# Patient Record
Sex: Male | Born: 1990 | Race: Black or African American | Hispanic: No | Marital: Single | Smoking: Never smoker
Health system: Southern US, Community
[De-identification: ages and names within clinical notes are randomized; demographics above are authoritative.]

## PROBLEM LIST (undated history)

## (undated) ENCOUNTER — Emergency Department (HOSPITAL_COMMUNITY): Disposition: A | Discharge status: Home / Self Care | Payer: No Typology Code available for payment source

## (undated) DIAGNOSIS — F209 Schizophrenia, unspecified: Secondary | ICD-10-CM

## (undated) DIAGNOSIS — Z5900 Homelessness unspecified: Secondary | ICD-10-CM

## (undated) DIAGNOSIS — R4689 Other symptoms and signs involving appearance and behavior: Secondary | ICD-10-CM

## (undated) DIAGNOSIS — IMO0001 Reserved for inherently not codable concepts without codable children: Secondary | ICD-10-CM

---
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
## 2010-08-11 ENCOUNTER — Inpatient Hospital Stay (INDEPENDENT_AMBULATORY_CARE_PROVIDER_SITE_OTHER)
Admission: RE | Admit: 2010-08-11 | Discharge: 2010-08-11 | Disposition: A | Discharge status: Home / Self Care | Payer: Self-pay | Source: Ambulatory Visit | Attending: Family Medicine | Admitting: Family Medicine

## 2010-08-11 DIAGNOSIS — R6889 Other general symptoms and signs: Secondary | ICD-10-CM

## 2010-08-14 LAB — GC/CHLAMYDIA PROBE AMP, GENITAL
Chlamydia, DNA Probe: POSITIVE — AB
GC Probe Amp, Genital: NEGATIVE

## 2010-10-30 LAB — CBC
Hemoglobin: 16.1
MCV: 91.3
RDW: 12.8

## 2010-10-30 LAB — DIFFERENTIAL
Eosinophils Absolute: 0.2
Eosinophils Relative: 2
Lymphocytes Relative: 49 — ABNORMAL HIGH
Lymphs Abs: 3.6
Monocytes Absolute: 0.7
Monocytes Relative: 9
Neutrophils Relative %: 38 — ABNORMAL LOW

## 2010-10-30 LAB — POCT I-STAT, CHEM 8
Chloride: 104
HCT: 48
Potassium: 4.4
Sodium: 138

## 2010-10-30 LAB — HEPATIC FUNCTION PANEL
ALT: 10
Alkaline Phosphatase: 61
Indirect Bilirubin: 0.7

## 2010-10-30 LAB — LIPASE, BLOOD: Lipase: 21

## 2010-10-30 LAB — PROTIME-INR: Prothrombin Time: 13.9

## 2011-04-17 ENCOUNTER — Emergency Department (HOSPITAL_COMMUNITY)
Admission: EM | Admit: 2011-04-17 | Discharge: 2011-04-17 | Disposition: A | Discharge status: Home / Self Care | Payer: Self-pay | Attending: Emergency Medicine | Admitting: Emergency Medicine

## 2011-04-17 ENCOUNTER — Other Ambulatory Visit: Payer: Self-pay

## 2011-04-17 ENCOUNTER — Encounter (HOSPITAL_COMMUNITY): Payer: Self-pay | Admitting: Emergency Medicine

## 2011-04-17 DIAGNOSIS — M62838 Other muscle spasm: Secondary | ICD-10-CM | POA: Insufficient documentation

## 2011-04-17 DIAGNOSIS — M545 Low back pain, unspecified: Secondary | ICD-10-CM | POA: Insufficient documentation

## 2011-04-17 DIAGNOSIS — R202 Paresthesia of skin: Secondary | ICD-10-CM

## 2011-04-17 DIAGNOSIS — R209 Unspecified disturbances of skin sensation: Secondary | ICD-10-CM | POA: Insufficient documentation

## 2011-04-17 MED ORDER — METHOCARBAMOL 500 MG PO TABS: 1000.0000 mg | ORAL_TABLET | Freq: Four times a day (QID) | ORAL | Status: AC

## 2011-04-17 MED ORDER — HYDROCODONE-ACETAMINOPHEN 5-325 MG PO TABS: ORAL_TABLET | ORAL | Status: AC

## 2011-04-17 MED ORDER — NAPROXEN 500 MG PO TABS: 500.0000 mg | ORAL_TABLET | Freq: Two times a day (BID) | ORAL | Status: DC

## 2011-04-17 NOTE — ED Notes (Signed)
Got bad x 1 month ago

## 2011-04-17 NOTE — Discharge Instructions (Signed)
Please read and follow all provided instructions.  Your diagnoses today include:  1. Muscle spasm   2. Paresthesias     Tests performed today include:  Vital signs - see below for your results today  Medications prescribed:   Vicodin (hydrocodone/acetaminophen) - narcotic pain medication  You have been prescribed narcotic pain medication such as Vicodin or Percocet: DO NOT drive or perform any activities that require you to be awake and alert because this medicine can make you drowsy. BE VERY CAREFUL not to take multiple medicines containing Tylenol (also called acetaminophen). Doing so can lead to an overdose which can damage your liver and cause liver failure and possibly death.    Robaxin (methocarbamol) - muscle relaxer medication  You have been prescribed a muscle relaxer medication such as Robaxin, Flexeril, or Valium: DO NOT drive or perform any activities that require you to be awake and alert because this medicine can make you drowsy.    Naproxen - anti-inflammatory pain medication  Do not exceed 500mg  naproxen every 12 hours  You have been prescribed an anti-inflammatory medication or NSAID. Take with food. Take smallest effective dose for the shortest duration needed for your pain. Stop taking if you experience stomach pain or vomiting.   Take any prescribed medications only as directed.  Home care instructions:   Follow any educational materials contained in this packet  Please rest, use ice or heat on your back for the next several days  Do not lift, push, pull anything more than 10 pounds for the next week  Follow-up instructions: Please follow-up with your primary care provider in the next 1 week for further evaluation of your symptoms. If you do not have a primary care doctor -- see below for referral information.   Return instructions:  SEEK IMMEDIATE MEDICAL ATTENTION IF YOU HAVE:  New numbness, tingling, weakness, or problem with the use of your arms or  legs  Severe back pain not relieved with medications  Loss control of your bowels or bladder  Increasing pain in any areas of the body (such as chest or abdominal pain)  Shortness of breath, dizziness, or fainting.   Worsening nausea (feeling sick to your stomach), vomiting, fever, or sweats  Any other emergent concerns regarding your health   Additional Information:  Your vital signs today were: BP 121/78  Pulse 67  Temp(Src) 98.2 F (36.8 C) (Oral)  Resp 18  SpO2 100% If your blood pressure (BP) was elevated above 135/85 this visit, please have this repeated by your doctor within one month. -------------- No Primary Care Doctor Call [facility]  [phone] Other agencies that provide inexpensive medical care    [facility]  [phone]    [facility]  [phone]    [facility]  [phone]    [facility]  [phone]    [facility]  [phone]    [facility]  [phone] -------------- RESOURCE GUIDE:  Dental Problems  Patients with Medicaid: [facility] [phone] [address]                                            [phone] [address] Phone:  [phone]  Phone:  [phone]  If unable to pay or uninsured, contact:  [facility] or [facility]. to become qualified for the adult dental clinic.  Chronic Pain Problems Contact [facility]  [phone] Patients need to be referred by their primary care doctor.  Insufficient Money for Medicine Contact [employer]:  call "211" or [facility] [phone].  Psychological Services [facility]  [phone] [facility]  [phone] [facility]   [phone] (emergency services [phone])  Substance Abuse Resources [facility]  [phone] [facility] [phone] The  [place] [phone] Floydene Flock [phone] Residential & Outpatient Substance Abuse Program  [phone]  Abuse/Neglect [facility] Child Abuse Hotline [phone] [facility] Child Abuse Hotline [phone] (After Hours)  Emergency Shelter [facility] [phone]  Maternity Homes [facility] [place] [facility] [phone] [facility] [phone]  [facility] Resources  [facility]     [employer]                          [facility] Dept. [address] [place]                       [address]      [address]  Blondell Reveal Phone:  [phone]                                   Phone:  [phone]                 Phone:  [phone]  [facility] Phone:  [phone]  [facility] Child Abuse Hotline 7016750037 [phone] (After Hours)
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
## 2011-04-17 NOTE — ED Provider Notes (Signed)
Medical screening examination/treatment/procedure(s) were performed by non-physician practitioner and as supervising physician I was immediately available for consultation/collaboration.  Cheri Guppy, MD 04/17/11 [phone]
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
## 2011-05-30 ENCOUNTER — Encounter (HOSPITAL_COMMUNITY): Payer: Self-pay | Admitting: Emergency Medicine

## 2011-05-30 ENCOUNTER — Emergency Department (HOSPITAL_COMMUNITY)
Admission: EM | Admit: 2011-05-30 | Discharge: 2011-05-30 | Disposition: A | Discharge status: Home / Self Care | Payer: Self-pay | Attending: Emergency Medicine | Admitting: Emergency Medicine

## 2011-05-30 DIAGNOSIS — IMO0002 Reserved for concepts with insufficient information to code with codable children: Secondary | ICD-10-CM | POA: Insufficient documentation

## 2011-05-30 DIAGNOSIS — M549 Dorsalgia, unspecified: Secondary | ICD-10-CM | POA: Insufficient documentation

## 2011-05-30 DIAGNOSIS — IMO0001 Reserved for inherently not codable concepts without codable children: Secondary | ICD-10-CM | POA: Insufficient documentation

## 2011-05-30 DIAGNOSIS — M79609 Pain in unspecified limb: Secondary | ICD-10-CM | POA: Insufficient documentation

## 2011-05-30 DIAGNOSIS — M62838 Other muscle spasm: Secondary | ICD-10-CM | POA: Insufficient documentation

## 2011-05-30 DIAGNOSIS — R202 Paresthesia of skin: Secondary | ICD-10-CM

## 2011-05-30 DIAGNOSIS — R209 Unspecified disturbances of skin sensation: Secondary | ICD-10-CM | POA: Insufficient documentation

## 2011-05-30 HISTORY — DX: Reserved for inherently not codable concepts without codable children: IMO0001

## 2011-05-30 MED ORDER — IBUPROFEN 600 MG PO TABS: 600.0000 mg | ORAL_TABLET | Freq: Four times a day (QID) | ORAL | Status: AC | PRN

## 2011-05-30 NOTE — ED Provider Notes (Signed)
Pt presents with chronic "nerve" pain demanding medication. He is threatening and non-compliant toward staff stating his intention to sue. I have discussed with him available options. He has no acute worrisome findings. Stable vital signs. Will discharge with brace and RX for ibuprofen 600 mg.   Loren Racer, MD 05/30/11 2238
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
## 2011-05-30 NOTE — Discharge Instructions (Signed)
Followup with orthopedics if symptoms continue. Use conservative methods at home including heat therapy and cold therapy as we discussed. More information on cold therapy is listed below.    SEEK IMMEDIATE MEDICAL ATTENTION IF: New numbness, tingling, weakness, or problem with the use of your arms or legs.  Severe back pain not relieved with medications.  Change in bowel or bladder control.  Increasing pain in any areas of the body (such as chest or abdominal pain).  Shortness of breath, dizziness or fainting.  Nausea (feeling sick to your stomach), vomiting, fever, or sweats.  COLD THERAPY DIRECTIONS:  Ice or gel packs can be used to reduce both pain and swelling. Ice is the most helpful within the first 24 to 48 hours after an injury or flareup from overusing a muscle or joint.  Ice is effective, has very few side effects, and is safe for most people to use.   If you expose your skin to cold temperatures for too long or without the proper protection, you can damage your skin or nerves. Watch for signs of skin damage due to cold.   HOME CARE INSTRUCTIONS  Follow these tips to use ice and cold packs safely.  Place a dry or damp towel between the ice and skin. A damp towel will cool the skin more quickly, so you may need to shorten the time that the ice is used.  For a more rapid response, add gentle compression to the ice.  Ice for no more than 10 to 20 minutes at a time. The bonier the area you are icing, the less time it will take to get the benefits of ice.  Check your skin after 5 minutes to make sure there are no signs of a poor response to cold or skin damage.  Rest 20 minutes or more in between uses.  Once your skin is numb, you can end your treatment. You can test numbness by very lightly touching your skin. The touch should be so light that you do not see the skin dimple from the pressure of your fingertip. When using ice, most people will feel these normal sensations in this order:  cold, burning, aching, and numbness.  Do not use ice on someone who cannot communicate their responses to pain, such as small children or people with dementia.   HOW TO MAKE AN ICE PACK  To make an ice pack, do one of the following:  Place crushed ice or a bag of frozen vegetables in a sealable plastic bag. Squeeze out the excess air. Place this bag inside another plastic bag. Slide the bag into a pillowcase or place a damp towel between your skin and the bag.  Mix 3 parts water with 1 part rubbing alcohol. Freeze the mixture in a sealable plastic bag. When you remove the mixture from the freezer, it will be slushy. Squeeze out the excess air. Place this bag inside another plastic bag. Slide the bag into a pillowcase or place a damp towel between your skin and the bag.   SEEK MEDICAL CARE IF:  You develop white spots on your skin. This may give the skin a blotchy (mottled) appearance.  Your skin turns blue or pale.  Your skin becomes waxy or hard.  Your swelling gets worse.  MAKE SURE YOU:  Understand these instructions.  Will watch your condition.  Will get help right away if you are not doing well or get worse.    Chronic Pain Discharge Instructions  Emergency care  providers appreciate that many patients coming to [place] are in severe pain and we wish to address their pain in the safest, most responsible manner.  It is important to recognize however, that the proper treatment of chronic pain differs from that of the pain of injuries and acute illnesses.  Our goal is to provide quality, safe, personalized care and we thank you for giving [place] the opportunity to serve you. The use of narcotics and related agents for chronic pain syndromes may lead to additional physical and psychological problems.  Nearly as many people die from prescription narcotics each year as die from car crashes.  Additionally, this risk is increased if such prescriptions are obtained from a variety of sources.  Therefore, only  your primary care physician or a pain management specialist is able to safely treat such syndromes with narcotic medications long-term.    Documentation revealing such prescriptions have been sought from multiple sources may prohibit [place] from providing a refill or different narcotic medication.  Your name may be checked first through the [facility] Controlled Substances Reporting System.  This database is a record of controlled substance medication prescriptions that the patient has received.  This has been established by [facility] in an effort to eliminate the dangerous, and often life threatening, practice of obtaining multiple prescriptions from different medical providers.   If you have a chronic pain syndrome (i.e. chronic headaches, recurrent back or neck pain, dental pain, abdominal or pelvis pain without a specific diagnosis, or neuropathic pain such as fibromyalgia) or recurrent visits for the same condition without an acute diagnosis, you may be treated with non-narcotics and other non-addictive medicines.  Allergic reactions or negative side effects that may be reported by a patient to such medications will not typically lead to the use of a narcotic analgesic or other controlled substance as an alternative.   Patients managing chronic pain with a personal physician should have provisions in place for breakthrough pain.  If you are in crisis, you should call your physician.  If your physician directs you to the emergency department, please have the doctor call and speak to our attending physician concerning your care.   When patients come to the Emergency Department (ED) with acute medical conditions in which the Emergency Department physician feels appropriate to prescribe narcotic or sedating pain medication, the physician will prescribe these in very limited quantities.  The amount of these medications will last only until you can see your primary care physician in his/her office.  Any  patient who returns to the ED seeking refills should expect only non-narcotic pain medications.   In the event of an acute medical condition exists and the emergency physician feels it is necessary that the patient be given a narcotic or sedating medication -  a responsible adult driver should be present in the room prior to the medication being given by the nurse.   Prescriptions for narcotic or sedating medications that have been lost, stolen or expired will not be refilled in the Emergency Department.    Patients who have chronic pain may receive non-narcotic prescriptions until seen by their primary care physician.  It is every patient's personal responsibility to maintain active prescriptions with his or her primary care physician or specialist.
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
## 2011-05-30 NOTE — Progress Notes (Signed)
Orthopedic Tech Progress Note Patient Details:  Justin [name] [birth year] [id number]  Other Ortho Devices Type of Ortho Device: Knee Sleeve Ortho Device Location: right knee Ortho Device Interventions: Application   [name] 05/30/2011, 10:24 PM

## 2011-05-30 NOTE — ED Notes (Signed)
Dr. Ranae Palms at bedside to speak with pt.  Discussed with pt that he will be discharged with knee sleeve and given rx for ibuprofen.  Pt agreed to plan of care as witnessed by this RN.

## 2011-05-30 NOTE — ED Notes (Addendum)
Patient complaining of "muscle strain" left leg pain from thigh to his knee; patient also complaining of "nerve pains" in both feet and in lower back (symptoms have been going on for two months).  Patient states that he was seen here recently for the same symptoms. Patient states that his nerve pain becomes worse when the weather gets cold.  Patient was given pain medication prescription for his back.

## 2011-05-30 NOTE — ED Notes (Signed)
Rx x 1, d/c to lobby in wheelchair.  Pt requesting paperwork, given d/c papers and pt acknowledged d/c paperwork.

## 2011-06-11 NOTE — ED Provider Notes (Signed)
Medical screening examination/treatment/procedure(s) were conducted as a shared visit with non-physician practitioner(s) and myself.  I personally evaluated the patient during the encounter   Loren Racer, MD 06/11/11 [phone]

## 2011-09-15 ENCOUNTER — Emergency Department (HOSPITAL_COMMUNITY)
Admission: EM | Admit: 2011-09-15 | Discharge: 2011-09-17 | Disposition: A | Discharge status: Other Acute Inpatient Hospital | Payer: Self-pay | Attending: Emergency Medicine | Admitting: Emergency Medicine

## 2011-09-15 ENCOUNTER — Encounter (HOSPITAL_COMMUNITY): Payer: Self-pay | Admitting: Emergency Medicine

## 2011-09-15 DIAGNOSIS — Z046 Encounter for general psychiatric examination, requested by authority: Secondary | ICD-10-CM

## 2011-09-15 DIAGNOSIS — R4689 Other symptoms and signs involving appearance and behavior: Secondary | ICD-10-CM

## 2011-09-15 DIAGNOSIS — Z008 Encounter for other general examination: Secondary | ICD-10-CM | POA: Insufficient documentation

## 2011-09-15 DIAGNOSIS — F29 Unspecified psychosis not due to a substance or known physiological condition: Secondary | ICD-10-CM | POA: Insufficient documentation

## 2011-09-15 LAB — COMPREHENSIVE METABOLIC PANEL WITH GFR
ALT: 9 U/L (ref 0–53)
AST: 15 U/L (ref 0–37)
Alkaline Phosphatase: 71 U/L (ref 39–117)
BUN: 10 mg/dL (ref 6–23)
Calcium: 9.7 mg/dL (ref 8.4–10.5)
Chloride: 100 meq/L (ref 96–112)
GFR calc Af Amer: >90 mL/min (ref >90)
GFR calc non Af Amer: >90 mL/min (ref >90)
Glucose, Bld: 87 mg/dL (ref 70–99)
Total Bilirubin: 0.6 mg/dL (ref 0.3–1.2)

## 2011-09-15 LAB — CBC WITH DIFFERENTIAL/PLATELET
Basophils Absolute: 0.1 K/uL (ref 0.0–0.1)
Basophils Relative: 1 % (ref 0–1)
Eosinophils Absolute: 0.1 10*3/uL (ref 0.0–0.7)
Eosinophils Relative: 1 % (ref 0–5)
HCT: 43.9 % (ref 39.0–52.0)
Hemoglobin: 16.3 g/dL (ref 13.0–17.0)
Lymphocytes Relative: 43 % (ref 12–46)
Lymphs Abs: 2.9 K/uL (ref 0.7–4.0)
MCH: 30.9 pg (ref 26.0–34.0)
MCHC: 37.1 g/dL — ABNORMAL HIGH (ref 30.0–36.0)
MCV: 83.3 fL (ref 78.0–100.0)
Monocytes Absolute: 0.4 10*3/uL (ref 0.1–1.0)
Monocytes Relative: 6 % (ref 3–12)
Neutro Abs: 3.3 K/uL (ref 1.7–7.7)
Neutrophils Relative %: 49 % (ref 43–77)
Platelets: 164 K/uL (ref 150–400)
RBC: 5.27 MIL/uL (ref 4.22–5.81)
RDW: 12.2 % (ref 11.5–15.5)
WBC: 6.8 10*3/uL (ref 4.0–10.5)

## 2011-09-15 LAB — COMPREHENSIVE METABOLIC PANEL
Albumin: 4.6 g/dL (ref 3.5–5.2)
CO2: 25 mEq/L (ref 19–32)
Creatinine, Ser: 0.77 mg/dL (ref 0.50–1.35)
Potassium: 3.8 mEq/L (ref 3.5–5.1)
Sodium: 134 mEq/L — ABNORMAL LOW (ref 135–145)
Total Protein: 7.2 g/dL (ref 6.0–8.3)

## 2011-09-15 LAB — ACETAMINOPHEN LEVEL: Acetaminophen (Tylenol), Serum: <15 ug/mL (ref 10–30)

## 2011-09-15 LAB — ETHANOL: Alcohol, Ethyl (B): <11 mg/dL (ref 0–11)

## 2011-09-15 MED ORDER — ONDANSETRON HCL 4 MG PO TABS: 4.0000 mg | ORAL_TABLET | Freq: Three times a day (TID) | ORAL | Status: DC | PRN

## 2011-09-15 MED ORDER — ZOLPIDEM TARTRATE 5 MG PO TABS: 5.0000 mg | ORAL_TABLET | Freq: Every evening | ORAL | Status: DC | PRN

## 2011-09-15 MED ORDER — LORAZEPAM 1 MG PO TABS: 1.0000 mg | ORAL_TABLET | Freq: Three times a day (TID) | ORAL | Status: DC | PRN

## 2011-09-15 MED ORDER — NICOTINE 21 MG/24HR TD PT24: 21.0000 mg | MEDICATED_PATCH | Freq: Every day | TRANSDERMAL | Status: DC

## 2011-09-15 MED ORDER — IBUPROFEN 600 MG PO TABS
600.0000 mg | ORAL_TABLET | Freq: Three times a day (TID) | ORAL | Status: DC | PRN
  Administered 2011-09-16 (×2): 600 mg via ORAL
  Filled 2011-09-15 (×2): qty 1

## 2011-09-15 NOTE — ED Notes (Signed)
[employer] at bedside 
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
## 2011-09-15 NOTE — ED Notes (Signed)
IVC - SI/HI/ choking dog

## 2011-09-15 NOTE — ED Provider Notes (Signed)
Medical screening examination/treatment/procedure(s) were performed by non-physician practitioner and as supervising physician I was immediately available for consultation/collaboration.   Gavin Pound. Boubacar Lerette, MD 09/15/11 2210

## 2011-09-16 LAB — RAPID URINE DRUG SCREEN, HOSP PERFORMED
Amphetamines: NOT DETECTED
Benzodiazepines: NOT DETECTED
Opiates: NOT DETECTED
Tetrahydrocannabinol: NOT DETECTED

## 2011-09-16 NOTE — ED Notes (Signed)
Pt's aunt into see 

## 2011-09-16 NOTE — ED Notes (Signed)
Talking quielty w/ visitor
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
## 2011-09-16 NOTE — ED Notes (Signed)
telepsych in progress 

## 2011-09-16 NOTE — ED Notes (Signed)
ACT into see 

## 2011-09-16 NOTE — ED Notes (Signed)
Up to the desk on the phone 

## 2011-09-16 NOTE — ED Notes (Signed)
Pt is aware that he is going to remain under IVC.

## 2011-09-16 NOTE — ED Notes (Signed)
Up to the bathroom 

## 2011-09-16 NOTE — BHH Counselor (Signed)
Justin [name], assessment counselor at [facility], submitted Pt for admission to [facility]. Consulted with Jacquelyne Balint, [facility] who confirmed there is not an appropriate bed available at this time. Gave clinical report to Dr. Sudie Grumbling who accepted Pt when a 400 hall bed is available.  Harlin Rain Patsy [place]

## 2011-09-16 NOTE — ED Notes (Signed)
IVC remains in place per Dr Clarene Duke.

## 2011-09-17 NOTE — BHH Counselor (Signed)
Per shift report, patient accepted to [facility] by Dr. Theotis Barrio pending a 400 hall bed.   Writer was later informed by patient's nurse-Michael that their was a telepsych completed yesterday by nursing staff Janie. The telepsych psychiatrist  recommended discharge home and reversed patient's IVC papers. This Clinical research associate and Casimiro Needle are both puzzled and confused as to why this patient is still in the ED awaiting a bed at [facility]. Appears that patient should have been discharged yesterday. Writer and nurse reviewed notes written by patient's nurse and ACT team staff yesterday and today to see if their was a documented reason for patient to remain in the ED for holding/bed placement. We were not able to find any relevant documentation to keep patient in the ED. Writer discussed this issue with EDP-Dr. Silverio Lay. He also found no documented reason to keep patient in the ED if telepsych recommended discharge yesterday. Dr. Silverio Lay agreed to discharge patient accordingly after carefully reviewing all notes. Writer will provide patient with the appropriate follow-up referrals to mental health, local therapist/psychiatrist, mobile crises, etc.

## 2011-09-17 NOTE — ED Provider Notes (Signed)
Care assumed at sign out. As per tele psych by Dr. Candida Peeling, patient is no longer having delusions or hallucinations or suicidal or homicidal ideations. He is to be discharged as per psych. Will d/c patient on this recommendation.   Richardean Canal, MD 09/17/11 [phone]

## 2011-09-17 NOTE — ED Provider Notes (Signed)
BP 110/67  Pulse 53  Temp 97.8 F (36.6 C) (Oral)  Resp 18  SpO2 100% No issues overnight. Pt IVC for psychosis. Accepted at [facility] pending bed availability   Loren Racer, MD 09/17/11 [phone]

## 2011-09-17 NOTE — ED Provider Notes (Signed)
Medical screening examination/treatment/procedure(s) were performed by non-physician practitioner and as supervising physician I was immediately available for consultation/collaboration.   Amunique Neyra Y. Sherrina Zaugg, MD 09/17/11 1557 

## 2014-03-18 ENCOUNTER — Emergency Department (HOSPITAL_COMMUNITY)
Admission: EM | Admit: 2014-03-18 | Discharge: 2014-03-18 | Disposition: A | Discharge status: Home / Self Care | Payer: Self-pay | Attending: Emergency Medicine | Admitting: Emergency Medicine

## 2014-03-18 ENCOUNTER — Encounter (HOSPITAL_COMMUNITY): Payer: Self-pay | Admitting: Emergency Medicine

## 2014-03-18 DIAGNOSIS — M79672 Pain in left foot: Secondary | ICD-10-CM | POA: Insufficient documentation

## 2014-03-18 DIAGNOSIS — Z590 Homelessness: Secondary | ICD-10-CM | POA: Insufficient documentation

## 2014-03-18 DIAGNOSIS — R2243 Localized swelling, mass and lump, lower limb, bilateral: Secondary | ICD-10-CM | POA: Insufficient documentation

## 2014-03-18 DIAGNOSIS — M79671 Pain in right foot: Secondary | ICD-10-CM | POA: Insufficient documentation

## 2014-03-18 HISTORY — DX: Homelessness: Z59.0

## 2014-03-18 HISTORY — DX: Homelessness unspecified: Z59.00

## 2014-03-18 HISTORY — DX: Other symptoms and signs involving appearance and behavior: R46.89

## 2014-03-18 MED ORDER — IBUPROFEN 600 MG PO TABS: 600.0000 mg | ORAL_TABLET | Freq: Four times a day (QID) | ORAL | Status: DC | PRN

## 2014-03-18 MED ORDER — IBUPROFEN 400 MG PO TABS
600.0000 mg | ORAL_TABLET | Freq: Once | ORAL | Status: AC
  Administered 2014-03-18: 600 mg via ORAL
  Filled 2014-03-18 (×2): qty 1

## 2014-03-18 NOTE — ED Notes (Signed)
Pt. arrived with EMS from [facility] ( homeless) reorts bilateral foot pain / swelling for several weeks , denies injury , pt. stated he has been walking long distance .

## 2014-03-18 NOTE — Discharge Instructions (Signed)
Cryotherapy °Cryotherapy means treatment with cold. Ice or gel packs can be used to reduce both pain and swelling. Ice is the most helpful within the first 24 to 48 hours after an injury or flare-up from overusing a muscle or joint. Sprains, strains, spasms, burning pain, shooting pain, and aches can all be eased with ice. Ice can also be used when recovering from surgery. Ice is effective, has very few side effects, and is safe for most people to use. °PRECAUTIONS  °Ice is not a safe treatment option for people with: °· Raynaud phenomenon. This is a condition affecting small blood vessels in the extremities. Exposure to cold may cause your problems to return. °· Cold hypersensitivity. There are many forms of cold hypersensitivity, including: °¨ Cold urticaria. Red, itchy hives appear on the skin when the tissues begin to warm after being iced. °¨ Cold erythema. This is a red, itchy rash caused by exposure to cold. °¨ Cold hemoglobinuria. Red blood cells break down when the tissues begin to warm after being iced. The hemoglobin that carry oxygen are passed into the urine because they cannot combine with blood proteins fast enough. °· Numbness or altered sensitivity in the area being iced. °If you have any of the following conditions, do not use ice until you have discussed cryotherapy with your caregiver: °· Heart conditions, such as arrhythmia, angina, or chronic heart disease. °· High blood pressure. °· Healing wounds or open skin in the area being iced. °· Current infections. °· Rheumatoid arthritis. °· Poor circulation. °· Diabetes. °Ice slows the blood flow in the region it is applied. This is beneficial when trying to stop inflamed tissues from spreading irritating chemicals to surrounding tissues. However, if you expose your skin to cold temperatures for too long or without the proper protection, you can damage your skin or nerves. Watch for signs of skin damage due to cold. °HOME CARE INSTRUCTIONS °Follow  these tips to use ice and cold packs safely. °· Place a dry or damp towel between the ice and skin. A damp towel will cool the skin more quickly, so you may need to shorten the time that the ice is used. °· For a more rapid response, add gentle compression to the ice. °· Ice for no more than 10 to 20 minutes at a time. The bonier the area you are icing, the less time it will take to get the benefits of ice. °· Check your skin after 5 minutes to make sure there are no signs of a poor response to cold or skin damage. °· Rest 20 minutes or more between uses. °· Once your skin is numb, you can end your treatment. You can test numbness by very lightly touching your skin. The touch should be so light that you do not see the skin dimple from the pressure of your fingertip. When using ice, most people will feel these normal sensations in this order: cold, burning, aching, and numbness. °· Do not use ice on someone who cannot communicate their responses to pain, such as small children or people with dementia. °HOW TO MAKE AN ICE PACK °Ice packs are the most common way to use ice therapy. Other methods include ice massage, ice baths, and cryosprays. Muscle creams that cause a cold, tingly feeling do not offer the same benefits that ice offers and should not be used as a substitute unless recommended by your caregiver. °To make an ice pack, do one of the following: °· Place crushed ice or a   bag of frozen vegetables in a sealable plastic bag. Squeeze out the excess air. Place this bag inside another plastic bag. Slide the bag into a pillowcase or place a damp towel between your skin and the bag. °· Mix 3 parts water with 1 part rubbing alcohol. Freeze the mixture in a sealable plastic bag. When you remove the mixture from the freezer, it will be slushy. Squeeze out the excess air. Place this bag inside another plastic bag. Slide the bag into a pillowcase or place a damp towel between your skin and the bag. °SEEK MEDICAL CARE  IF: °· You develop white spots on your skin. This may give the skin a blotchy (mottled) appearance. °· Your skin turns blue or pale. °· Your skin becomes waxy or hard. °· Your swelling gets worse. °MAKE SURE YOU:  °· Understand these instructions. °· Will watch your condition. °· Will get help right away if you are not doing well or get worse. °Document Released: 09/11/2010 Document Revised: 06/01/2013 Document Reviewed: 09/11/2010 °ExitCare® Patient Information ©2015 [employer]. This information is not intended to replace advice given to you by your health care provider. Make sure you discuss any questions you have with your health care provider. ° °

## 2014-03-18 NOTE — ED Notes (Signed)
Patient states he did not want to apply ice on feet.

## 2014-03-18 NOTE — ED Notes (Signed)
Patient presents stating his feet are swollen an hurt which is making him sick.  States he is having trouble walking (homeless).  Feet are swollen bilaterally but no opened areas noted.

## 2014-03-18 NOTE — ED Provider Notes (Signed)
CSN: [id number]     Arrival date & time 03/18/14  2147 History  This chart was scribed for non-physician practitioner, Junius FinnerErin O'Malley, PA-C, working with Marisa Severinlga Otter, MD, by Bronson CurbJacqueline Melvin, ED Scribe. This patient was seen in room TR05C/TR05C and the patient's care was started at 11:02 PM.    Chief Complaint  Patient presents with  . Foot Pain    The history is provided by the patient. No language interpreter was used.     HPI Comments: Justin [name] is a 24 y.o. male, with no significant medical history, who presents to the Emergency Department complaining of constant, 8/10, aching, bilateral foot pain for the past 2 weeks. Patient reports the pain is making him "sick" but cannot clarify what other symptoms he is having. There is associated mild swelling. Pain is worse with ambulation.  Patient is homeless and notes the pain and swelling are worse when ambulating. He denies fever, chills, nausea, or vomiting.   Past Medical History  Diagnosis Date  . No significant past medical history   . Homelessness   . Abnormal behavior    History reviewed. No pertinent past surgical history. No family history on file. History  Substance Use Topics  . Smoking status: Never Smoker   . Smokeless tobacco: Not on file  . Alcohol Use: No    Review of Systems  Constitutional: Negative for fever and chills.  Gastrointestinal: Negative for nausea and vomiting.  Musculoskeletal: Positive for myalgias.  Skin: Negative for wound.      Allergies  Review of patient's allergies indicates no known allergies.  Home Medications   Prior to Admission medications   Medication Sig Start Date End Date Taking? Authorizing Provider  ibuprofen (ADVIL,MOTRIN) 600 MG tablet Take 1 tablet (600 mg total) by mouth every 6 (six) hours as needed. 03/18/14   Junius FinnerErin O'Malley, PA-C   Triage Vitals: BP 112/67 mmHg  Pulse 79  Temp(Src) 98.9 F (37.2 C) (Oral)  Resp 14  Ht 5\' 8"  (1.727 m)  Wt 148 lb (67.132 kg)   BMI 22.51 kg/m2  SpO2 100%  Physical Exam  Constitutional: He is oriented to person, place, and time. He appears well-developed and well-nourished.  HENT:  Head: Normocephalic and atraumatic.  Eyes: EOM are normal.  Neck: Normal range of motion.  Cardiovascular: Normal rate.   Pulses:      Dorsalis pedis pulses are 2+ on the right side, and 2+ on the left side.  Pulmonary/Chest: Effort normal.  Musculoskeletal: Normal range of motion.  Mild edema to bilateral feet with diffuse tenderness. Full ROM of ankle and toes.   Neurological: He is alert and oriented to person, place, and time.  Skin: Skin is warm and dry.  Skin intact. Diffuse, dry, flaky skin. No erythema or warmth.  Psychiatric: He has a normal mood and affect. His behavior is normal.  Nursing note and vitals reviewed.   ED Course  Procedures (including critical care time)  DIAGNOSTIC STUDIES: Oxygen Saturation is 100% on room air, normal by my interpretation.    COORDINATION OF CARE: At 2303 Discussed treatment plan with patient which includes ibuprofen. Patient agrees.   Labs Review Labs Reviewed - No data to display  Imaging Review No results found.   EKG Interpretation None      MDM   Final diagnoses:  Bilateral foot pain    Bilateral foot pain and swelling w/o evidence of underlying infection.  Will tx symptomatically. Encouraged pt to elevate feet to help with  swelling.  Rx: ibuprofen.   I personally performed the services described in this documentation, which was scribed in my presence. The recorded information has been reviewed and is accurate.    Junius Finner, PA-C 03/18/14 2321  Flint Melter, MD 03/19/14 Jacinta Shoe

## 2014-03-23 ENCOUNTER — Emergency Department (HOSPITAL_COMMUNITY): Payer: Self-pay

## 2014-03-23 ENCOUNTER — Encounter (HOSPITAL_COMMUNITY): Payer: Self-pay | Admitting: *Deleted

## 2014-03-23 ENCOUNTER — Emergency Department (HOSPITAL_COMMUNITY)
Admission: EM | Admit: 2014-03-23 | Discharge: 2014-03-23 | Discharge status: Against Medical Advice | Payer: Self-pay | Attending: Emergency Medicine | Admitting: Emergency Medicine

## 2014-03-23 ENCOUNTER — Encounter (HOSPITAL_COMMUNITY): Payer: Self-pay

## 2014-03-23 ENCOUNTER — Emergency Department (HOSPITAL_COMMUNITY)
Admission: EM | Admit: 2014-03-23 | Discharge: 2014-03-23 | Disposition: A | Discharge status: Home / Self Care | Payer: Self-pay | Attending: Emergency Medicine | Admitting: Emergency Medicine

## 2014-03-23 DIAGNOSIS — S99912A Unspecified injury of left ankle, initial encounter: Secondary | ICD-10-CM | POA: Insufficient documentation

## 2014-03-23 DIAGNOSIS — S99911A Unspecified injury of right ankle, initial encounter: Secondary | ICD-10-CM | POA: Insufficient documentation

## 2014-03-23 DIAGNOSIS — Z590 Homelessness: Secondary | ICD-10-CM | POA: Insufficient documentation

## 2014-03-23 DIAGNOSIS — IMO0002 Reserved for concepts with insufficient information to code with codable children: Secondary | ICD-10-CM

## 2014-03-23 DIAGNOSIS — S99921A Unspecified injury of right foot, initial encounter: Secondary | ICD-10-CM | POA: Insufficient documentation

## 2014-03-23 DIAGNOSIS — S3992XA Unspecified injury of lower back, initial encounter: Secondary | ICD-10-CM | POA: Insufficient documentation

## 2014-03-23 DIAGNOSIS — Y92410 Unspecified street and highway as the place of occurrence of the external cause: Secondary | ICD-10-CM | POA: Insufficient documentation

## 2014-03-23 DIAGNOSIS — S199XXA Unspecified injury of neck, initial encounter: Secondary | ICD-10-CM | POA: Insufficient documentation

## 2014-03-23 DIAGNOSIS — S99922A Unspecified injury of left foot, initial encounter: Secondary | ICD-10-CM | POA: Insufficient documentation

## 2014-03-23 DIAGNOSIS — Y998 Other external cause status: Secondary | ICD-10-CM | POA: Insufficient documentation

## 2014-03-23 DIAGNOSIS — T1490XA Injury, unspecified, initial encounter: Secondary | ICD-10-CM

## 2014-03-23 DIAGNOSIS — Y9389 Activity, other specified: Secondary | ICD-10-CM | POA: Insufficient documentation

## 2014-03-23 MED ORDER — IBUPROFEN 800 MG PO TABS
800.0000 mg | ORAL_TABLET | Freq: Once | ORAL | Status: AC
Start: 2014-03-23 — End: 2014-03-23
  Administered 2014-03-23: 800 mg via ORAL
  Filled 2014-03-23: qty 1

## 2014-03-23 MED ORDER — HYDROCODONE-ACETAMINOPHEN 5-325 MG PO TABS
1.0000 | ORAL_TABLET | Freq: Once | ORAL | Status: AC
  Administered 2014-03-23: 1 via ORAL
  Filled 2014-03-23: qty 1

## 2014-03-23 MED ORDER — METHOCARBAMOL 1000 MG/10ML IJ SOLN: 1000.0000 mg | Freq: Once | INTRAMUSCULAR | Status: DC

## 2014-03-23 MED ORDER — KETOROLAC TROMETHAMINE 30 MG/ML IJ SOLN
30.0000 mg | Freq: Once | INTRAMUSCULAR | Status: DC
  Filled 2014-03-23: qty 1

## 2014-03-23 MED ORDER — TETANUS-DIPHTH-ACELL PERTUSSIS 5-2.5-18.5 LF-MCG/0.5 IM SUSP
0.5000 mL | Freq: Once | INTRAMUSCULAR | Status: DC
  Filled 2014-03-23: qty 0.5

## 2014-03-23 MED ORDER — METHOCARBAMOL 1000 MG/10ML IJ SOLN
1000.0000 mg | Freq: Once | INTRAVENOUS | Status: DC
  Filled 2014-03-23: qty 10

## 2014-03-23 NOTE — ED Notes (Signed)
Patient transported to X-ray 

## 2014-03-23 NOTE — ED Notes (Signed)
Pt refusing all imaging scans
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
## 2014-03-23 NOTE — ED Notes (Signed)
Pt asleep, woke pt to Inform him of medications ordered. Pt refusing all medications; sts "I ain't taking no medicines."
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
## 2014-03-23 NOTE — ED Notes (Signed)
Dr. Palumbo at bedside. 
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
## 2015-01-19 ENCOUNTER — Other Ambulatory Visit: Payer: Self-pay

## 2015-01-19 ENCOUNTER — Emergency Department (HOSPITAL_COMMUNITY)
Admission: EM | Admit: 2015-01-19 | Discharge: 2015-01-20 | Disposition: A | Discharge status: Other Acute Inpatient Hospital | Payer: Federal, State, Local not specified - Other | Attending: Emergency Medicine | Admitting: Emergency Medicine

## 2015-01-19 DIAGNOSIS — I10 Essential (primary) hypertension: Secondary | ICD-10-CM | POA: Insufficient documentation

## 2015-01-19 DIAGNOSIS — R000 Tachycardia, unspecified: Secondary | ICD-10-CM | POA: Insufficient documentation

## 2015-01-19 DIAGNOSIS — F203 Undifferentiated schizophrenia: Secondary | ICD-10-CM

## 2015-01-19 DIAGNOSIS — R4182 Altered mental status, unspecified: Secondary | ICD-10-CM | POA: Insufficient documentation

## 2015-01-19 LAB — ETHANOL

## 2015-01-19 LAB — COMPREHENSIVE METABOLIC PANEL
ALK PHOS: 65 U/L (ref 38–126)
ALT: 13 U/L — ABNORMAL LOW (ref 17–63)
ANION GAP: 12 (ref 5–15)
AST: 16 U/L (ref 15–41)
Albumin: 5.1 g/dL — ABNORMAL HIGH (ref 3.5–5.0)
BILIRUBIN TOTAL: 0.9 mg/dL (ref 0.3–1.2)
BUN: 14 mg/dL (ref 6–20)
CALCIUM: 10 mg/dL (ref 8.9–10.3)
CO2: 26 mmol/L (ref 22–32)
CREATININE: 0.91 mg/dL (ref 0.61–1.24)
Chloride: 104 mmol/L (ref 101–111)
Glucose, Bld: 90 mg/dL (ref 65–99)
Potassium: 3.6 mmol/L (ref 3.5–5.1)
SODIUM: 142 mmol/L (ref 135–145)
TOTAL PROTEIN: 8.1 g/dL (ref 6.5–8.1)

## 2015-01-19 LAB — CBC WITH DIFFERENTIAL/PLATELET
BASOS PCT: 0 %
Basophils Absolute: 0 10*3/uL (ref 0.0–0.1)
EOS ABS: 0 10*3/uL (ref 0.0–0.7)
Eosinophils Relative: 0 %
HCT: 40.7 % (ref 39.0–52.0)
Hemoglobin: 14.6 g/dL (ref 13.0–17.0)
LYMPHS ABS: 2.1 10*3/uL (ref 0.7–4.0)
Lymphocytes Relative: 22 %
MCH: 29.7 pg (ref 26.0–34.0)
MCHC: 35.9 g/dL (ref 30.0–36.0)
MCV: 82.9 fL (ref 78.0–100.0)
MONO ABS: 0.9 10*3/uL (ref 0.1–1.0)
MONOS PCT: 9 %
Neutro Abs: 6.7 10*3/uL (ref 1.7–7.7)
Neutrophils Relative %: 69 %
Platelets: 230 10*3/uL (ref 150–400)
RBC: 4.91 MIL/uL (ref 4.22–5.81)
RDW: 13.4 % (ref 11.5–15.5)
WBC: 9.7 10*3/uL (ref 4.0–10.5)

## 2015-01-19 LAB — SALICYLATE LEVEL: Salicylate Lvl: <4 mg/dL (ref 2.8–30.0)

## 2015-01-19 LAB — ACETAMINOPHEN LEVEL

## 2015-01-19 LAB — CK: CK TOTAL: 180 U/L (ref 49–397)

## 2015-01-19 LAB — LIPASE, BLOOD: LIPASE: 19 U/L (ref 11–51)

## 2015-01-19 NOTE — ED Notes (Signed)
Per Dr. Clarene DukeLittle, leave patient alone for now.  Re-check him every 15 minutes to see if he will cooperate with staff members.  She said as long as he is not harming anyone or himself, try to introduce nursing skills gradually.
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
## 2015-01-19 NOTE — ED Notes (Signed)
It is believed pt is Justin MacleodRobert [name] 07-Jun-1990, when pt asked about this, all he does is shrug his shoulders.

## 2015-01-19 NOTE — ED Notes (Signed)
RN asked pt if he would like sandwiches. Pt nodded his head "yes". Pt has eaten x2 sandwiches, but does not verbally respond to RN questioning.

## 2015-01-19 NOTE — ED Notes (Signed)
Patient will not answer questions. Unable to obtain history, etc.

## 2015-01-19 NOTE — ED Notes (Signed)
Pt uncoroperative with staff at this time. Will attempt to draw blood at a later time.

## 2015-01-19 NOTE — ED Notes (Signed)
Pt alert, in no acute distress. Pt makes eye contact periodically but does not verbally respond to RN greeting or questioning. Warm blanket given due to pt position suggesting being cold.

## 2015-01-19 NOTE — ED Notes (Signed)
Bed: [id number] Expected date:  Expected time:  Means of arrival:  Comments: Ems- AMS
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
## 2015-01-19 NOTE — BH Assessment (Signed)
Contacted [facility] ER to order cart for tele-psych. Attempted to speak with patient. Patient was slightly responsive with looking at the tele-psych machine, and did respond slightly with head nods. However, this writer was unable to continue assessment patient was speaking softly incoherently as if he wanted to respond. Patient communication was not possible, and patient did appear to be responding to internal stimuli. Yaiza Palazzola K. Jesus GeneraHarris,  LPC-A, [facility]  Counselor 01/20/2015 12:02 AM

## 2015-01-20 ENCOUNTER — Inpatient Hospital Stay (HOSPITAL_COMMUNITY)
Admission: AD | Admit: 2015-01-20 | Discharge: 2015-02-04 | DRG: 885 | Disposition: A | Discharge status: Home / Self Care | Payer: Federal, State, Local not specified - Other | Source: Intra-hospital | Attending: Emergency Medicine | Admitting: Emergency Medicine

## 2015-01-20 ENCOUNTER — Emergency Department (HOSPITAL_COMMUNITY): Payer: Self-pay

## 2015-01-20 DIAGNOSIS — R040 Epistaxis: Secondary | ICD-10-CM | POA: Diagnosis present

## 2015-01-20 DIAGNOSIS — Z590 Homelessness: Secondary | ICD-10-CM | POA: Diagnosis not present

## 2015-01-20 DIAGNOSIS — G4700 Insomnia, unspecified: Secondary | ICD-10-CM | POA: Diagnosis present

## 2015-01-20 DIAGNOSIS — E039 Hypothyroidism, unspecified: Secondary | ICD-10-CM | POA: Diagnosis present

## 2015-01-20 DIAGNOSIS — E038 Other specified hypothyroidism: Secondary | ICD-10-CM | POA: Diagnosis not present

## 2015-01-20 DIAGNOSIS — Z87898 Personal history of other specified conditions: Secondary | ICD-10-CM

## 2015-01-20 DIAGNOSIS — F209 Schizophrenia, unspecified: Principal | ICD-10-CM | POA: Diagnosis present

## 2015-01-20 DIAGNOSIS — Z9119 Patient's noncompliance with other medical treatment and regimen: Secondary | ICD-10-CM | POA: Diagnosis not present

## 2015-01-20 DIAGNOSIS — F29 Unspecified psychosis not due to a substance or known physiological condition: Secondary | ICD-10-CM | POA: Clinically undetermined

## 2015-01-20 DIAGNOSIS — Z8709 Personal history of other diseases of the respiratory system: Secondary | ICD-10-CM | POA: Diagnosis not present

## 2015-01-20 DIAGNOSIS — F09 Unspecified mental disorder due to known physiological condition: Secondary | ICD-10-CM | POA: Diagnosis present

## 2015-01-20 DIAGNOSIS — E538 Deficiency of other specified B group vitamins: Secondary | ICD-10-CM | POA: Diagnosis present

## 2015-01-20 DIAGNOSIS — R4182 Altered mental status, unspecified: Secondary | ICD-10-CM | POA: Insufficient documentation

## 2015-01-20 DIAGNOSIS — F203 Undifferentiated schizophrenia: Secondary | ICD-10-CM | POA: Diagnosis not present

## 2015-01-20 DIAGNOSIS — G3184 Mild cognitive impairment, so stated: Secondary | ICD-10-CM | POA: Diagnosis not present

## 2015-01-20 DIAGNOSIS — F419 Anxiety disorder, unspecified: Secondary | ICD-10-CM | POA: Diagnosis present

## 2015-01-20 LAB — RAPID URINE DRUG SCREEN, HOSP PERFORMED
AMPHETAMINES: NOT DETECTED
BENZODIAZEPINES: NOT DETECTED
Barbiturates: NOT DETECTED
COCAINE: NOT DETECTED
OPIATES: NOT DETECTED
TETRAHYDROCANNABINOL: NOT DETECTED

## 2015-01-20 MED ORDER — BENZTROPINE MESYLATE 0.5 MG PO TABS
0.5000 mg | ORAL_TABLET | Freq: Two times a day (BID) | ORAL | Status: DC
  Administered 2015-01-20 – 2015-01-21 (×2): 0.5 mg via ORAL
  Filled 2015-01-20 (×7): qty 1

## 2015-01-20 MED ORDER — TRAZODONE HCL 50 MG PO TABS: 50.0000 mg | ORAL_TABLET | Freq: Every day | ORAL | Status: DC

## 2015-01-20 MED ORDER — ONDANSETRON HCL 4 MG PO TABS: 4.0000 mg | ORAL_TABLET | Freq: Three times a day (TID) | ORAL | Status: DC | PRN

## 2015-01-20 MED ORDER — RISPERIDONE 0.5 MG PO TBDP
0.5000 mg | ORAL_TABLET | Freq: Two times a day (BID) | ORAL | Status: DC
  Administered 2015-01-20: 0.5 mg via ORAL
  Filled 2015-01-20 (×2): qty 1

## 2015-01-20 MED ORDER — RISPERIDONE 0.5 MG PO TBDP
0.5000 mg | ORAL_TABLET | Freq: Two times a day (BID) | ORAL | Status: DC
  Administered 2015-01-20: 0.5 mg via ORAL
  Filled 2015-01-20 (×5): qty 1

## 2015-01-20 MED ORDER — HALOPERIDOL LACTATE 5 MG/ML IJ SOLN: 2.0000 mg | Freq: Once | INTRAMUSCULAR | Status: DC

## 2015-01-20 MED ORDER — ZIPRASIDONE MESYLATE 20 MG IM SOLR
10.0000 mg | Freq: Once | INTRAMUSCULAR | Status: AC
  Administered 2015-01-20: 10 mg via INTRAMUSCULAR
  Filled 2015-01-20: qty 20

## 2015-01-20 MED ORDER — ZIPRASIDONE MESYLATE 20 MG IM SOLR: 20.0000 mg | Freq: Once | INTRAMUSCULAR | Status: DC

## 2015-01-20 MED ORDER — ACETAMINOPHEN 325 MG PO TABS: 650.0000 mg | ORAL_TABLET | ORAL | Status: DC | PRN

## 2015-01-20 MED ORDER — BENZTROPINE MESYLATE 1 MG PO TABS
0.5000 mg | ORAL_TABLET | Freq: Two times a day (BID) | ORAL | Status: DC
  Administered 2015-01-20: 0.5 mg via ORAL
  Filled 2015-01-20: qty 1

## 2015-01-20 MED ORDER — TRAZODONE HCL 50 MG PO TABS
50.0000 mg | ORAL_TABLET | Freq: Every day | ORAL | Status: DC
  Administered 2015-01-20 – 2015-02-03 (×8): 50 mg via ORAL
  Filled 2015-01-20 (×17): qty 1
  Filled 2015-01-20: qty 7

## 2015-01-20 MED ORDER — ALUM & MAG HYDROXIDE-SIMETH 200-200-20 MG/5ML PO SUSP
30.0000 mL | ORAL | Status: DC | PRN
Start: 2015-01-20 — End: 2015-01-20
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
## 2015-01-20 NOTE — Tx Team (Signed)
Initial Interdisciplinary Treatment Plan   PATIENT STRESSORS: Health problems Medication change or noncompliance   PATIENT STRENGTHS: Physical Health   PROBLEM LIST: Problem List/Patient Goals Date to be addressed Date deferred Reason deferred Estimated date of resolution  Depression 01/20/15                                                      DISCHARGE CRITERIA:  Ability to meet basic life and health needs Adequate post-discharge living arrangements Verbal commitment to aftercare and medication compliance  PRELIMINARY DISCHARGE PLAN: Outpatient therapy Placement in alternative living arrangements  PATIENT/FAMIILY INVOLVEMENT: This treatment plan has been presented to and reviewed with the patient, Hermina BartersRobert E Peifer.  The patient and family have been given the opportunity to ask questions and make suggestions.  Mickie Baillizabeth O Iwenekha 01/20/2015, 6:45 PM

## 2015-01-20 NOTE — Progress Notes (Signed)
Adult Psychoeducational Group Note  Date:  01/20/2015 Time:  8:20 PM  Group Topic/Focus:  Wrap-Up Group:   The focus of this group is to help patients review their daily goal of treatment and discuss progress on daily workbooks.  Participation Level:  Did Not Attend  Pt did not attend wrap-up group.   Cleotilde NeerJasmine S Jaspreet Bodner 01/20/2015, 8:39 PM

## 2015-01-20 NOTE — ED Notes (Signed)
Patient refuses to speak this Clinical research associatewriter. Patient only states "I have no name". Encouragement and support provided and safety maintain. Q 15 min safety checks in place.

## 2015-01-20 NOTE — Consult Note (Signed)
[place] Psychiatry Consult   Reason for Consult:  Altered Mental status, Psychosis R/O Referring Physician:  EDP Patient Identification: Justin [name] MRN:  [id number] Principal Diagnosis: Schizophrenia, acute undifferentiated ([place]) Diagnosis:   Patient Active Problem List   Diagnosis Date Noted  . Schizophrenia, acute undifferentiated ([place]) [F20.3] 01/20/2015    Priority: High    Total Time spent with patient: 30 minutes  Subjective:   Justin [name] is a 24 y.o. male patient admitted with Altered Mental status, Psychosis R/O  HPI:  AA male, 24 years old was evaluated this morning for Altered Mental status.  Patient at this time is unable to participate in the evaluation due to his mental state.  Patient is disoriented, does not recall how he came to the ER and for what reasons.  Patient does not know his name and date of birth.  He was picked up by EMS after he was found lying on the floor.  Patient was staying at the [facility]  Since he is homeless.  He is disheveled, give short answers saying yes and no to all questions.  We will admit him to any inpatient Psychiatric unit while we continue daily evaluation.  Past Psychiatric History:  Unknown  Risk to Self: Suicidal Ideation:  (unk) Suicidal Intent:  (unk) Is patient at risk for suicide?:  (unk) Suicidal Plan?:  (unk) Access to Means:  (unk) What has been your use of drugs/alcohol within the last 12 months?:  (unk) How many times?:  (unk) Other Self Harm Risks:  (unk) Triggers for Past Attempts: Unknown Intentional Self Injurious Behavior:  (unk) Risk to Others: Homicidal Ideation:  (unk) Thoughts of Harm to Others:  (unk) Current Homicidal Intent:  (unk) Current Homicidal Plan:  (unk) Access to Homicidal Means:  (unk) Identified Victim:  (unk) History of harm to others?: No Assessment of Violence: None Noted Violent Behavior Description:  (Patient is calm and cooperative) Does patient have access to  weapons?:  (unk) Criminal Charges Pending?:  (unk) Does patient have a court date:  (unk) Prior Inpatient Therapy: Prior Inpatient Therapy:  (unk) Prior Therapy Dates:  (unk) Prior Therapy Facilty/Provider(s):  (unk) Reason for Treatment:  (unk) Prior Outpatient Therapy: Prior Outpatient Therapy:  (unk) Prior Therapy Dates:  (unk) Prior Therapy Facilty/Provider(s):  (unk) Reason for Treatment:  (unk) Does patient have an ACCT team?: Unknown Does patient have Intensive In-House Services?  : Unknown Does patient have [facility] services? : Unknown Does patient have P4CC services?: Unknown  Past Medical History: No past medical history on file. No past surgical history on file. Family History: No family history on file.   Family Psychiatric  History:   Unknown Social History:  History  Alcohol Use: Not on file     History  Drug Use Not on file    Social History   Social History  . Marital Status: Single    Spouse Name: N/A  . Number of Children: N/A  . Years of Education: N/A   Social History Main Topics  . Smoking status: Not on file  . Smokeless tobacco: Not on file  . Alcohol Use: Not on file  . Drug Use: Not on file  . Sexual Activity: Not on file   Other Topics Concern  . Not on file   Social History Narrative  . No narrative on file   Additional Social History:     Allergies:  Allergies not on file  Labs:  Results for orders placed or performed during  the hospital encounter of 01/19/15 (from the past 48 hour(s))  Comprehensive metabolic panel     Status: Abnormal   Collection Time: 01/19/15  5:27 PM  Result Value Ref Range   Sodium 142 135 - 145 mmol/L   Potassium 3.6 3.5 - 5.1 mmol/L   Chloride 104 101 - 111 mmol/L   CO2 26 22 - 32 mmol/L   Glucose, Bld 90 65 - 99 mg/dL   BUN 14 6 - 20 mg/dL   Creatinine, Ser 5.65 0.61 - 1.24 mg/dL   Calcium 99.4 8.9 - 37.1 mg/dL   Total Protein 8.1 6.5 - 8.1 g/dL   Albumin 5.1 (H) 3.5 - 5.0 g/dL   AST 16 15 - 41  U/L   ALT 13 (L) 17 - 63 U/L   Alkaline Phosphatase 65 38 - 126 U/L   Total Bilirubin 0.9 0.3 - 1.2 mg/dL   GFR calc non Af Amer NOT CALCULATED >60 mL/min   GFR calc Af Amer NOT CALCULATED >60 mL/min    Comment: (NOTE) The eGFR has been calculated using the CKD EPI equation. This calculation has not been validated in all clinical situations. eGFR's persistently <60 mL/min signify possible Chronic Kidney Disease.    Anion gap 12 5 - 15  Lipase, blood     Status: None   Collection Time: 01/19/15  5:27 PM  Result Value Ref Range   Lipase 19 11 - 51 U/L  CBC with Differential     Status: None   Collection Time: 01/19/15  5:27 PM  Result Value Ref Range   WBC 9.7 4.0 - 10.5 K/uL   RBC 4.91 4.22 - 5.81 MIL/uL   Hemoglobin 14.6 13.0 - 17.0 g/dL   HCT 90.7 07.2 - 17.1 %   MCV 82.9 78.0 - 100.0 fL   MCH 29.7 26.0 - 34.0 pg   MCHC 35.9 30.0 - 36.0 g/dL   RDW 16.5 46.1 - 24.3 %   Platelets 230 150 - 400 K/uL   Neutrophils Relative % 69 %   Neutro Abs 6.7 1.7 - 7.7 K/uL   Lymphocytes Relative 22 %   Lymphs Abs 2.1 0.7 - 4.0 K/uL   Monocytes Relative 9 %   Monocytes Absolute 0.9 0.1 - 1.0 K/uL   Eosinophils Relative 0 %   Eosinophils Absolute 0.0 0.0 - 0.7 K/uL   Basophils Relative 0 %   Basophils Absolute 0.0 0.0 - 0.1 K/uL  CK     Status: None   Collection Time: 01/19/15  5:27 PM  Result Value Ref Range   Total CK 180 49 - 397 U/L  Ethanol     Status: None   Collection Time: 01/19/15  5:28 PM  Result Value Ref Range   Alcohol, Ethyl (B) <5 <5 mg/dL    Comment:        LOWEST DETECTABLE LIMIT FOR SERUM ALCOHOL IS 5 mg/dL FOR MEDICAL PURPOSES ONLY   Acetaminophen level     Status: Abnormal   Collection Time: 01/19/15  5:28 PM  Result Value Ref Range   Acetaminophen (Tylenol), Serum <10 (L) 10 - 30 ug/mL    Comment:        THERAPEUTIC CONCENTRATIONS VARY SIGNIFICANTLY. A RANGE OF 10-30 ug/mL MAY BE AN EFFECTIVE CONCENTRATION FOR MANY PATIENTS. HOWEVER, SOME ARE BEST  TREATED AT CONCENTRATIONS OUTSIDE THIS RANGE. ACETAMINOPHEN CONCENTRATIONS >150 ug/mL AT 4 HOURS AFTER INGESTION AND >50 ug/mL AT 12 HOURS AFTER INGESTION ARE OFTEN ASSOCIATED WITH TOXIC REACTIONS.   Salicylate  level     Status: None   Collection Time: 01/19/15  5:28 PM  Result Value Ref Range   Salicylate Lvl <7.6 2.8 - 30.0 mg/dL  Urine rapid drug screen (hosp performed)     Status: None   Collection Time: 01/20/15  9:31 AM  Result Value Ref Range   Opiates NONE DETECTED NONE DETECTED   Cocaine NONE DETECTED NONE DETECTED   Benzodiazepines NONE DETECTED NONE DETECTED   Amphetamines NONE DETECTED NONE DETECTED   Tetrahydrocannabinol NONE DETECTED NONE DETECTED   Barbiturates NONE DETECTED NONE DETECTED    Comment:        DRUG SCREEN FOR MEDICAL PURPOSES ONLY.  IF CONFIRMATION IS NEEDED FOR ANY PURPOSE, NOTIFY LAB WITHIN 5 DAYS.        LOWEST DETECTABLE LIMITS FOR URINE DRUG SCREEN Drug Class       Cutoff (ng/mL) Amphetamine      1000 Barbiturate      200 Benzodiazepine   226 Tricyclics       333 Opiates          300 Cocaine          300 THC              50     Current Facility-Administered Medications  Medication Dose Route Frequency Provider Last Rate Last Dose  . acetaminophen (TYLENOL) tablet 650 mg  650 mg Oral Q4H PRN April Palumbo, MD      . alum & mag hydroxide-simeth (MAALOX/MYLANTA) 200-200-20 MG/5ML suspension 30 mL  30 mL Oral PRN April Palumbo, MD      . ondansetron [facility]) tablet 4 mg  4 mg Oral Q8H PRN April Palumbo, MD       No current outpatient prescriptions on file.    Musculoskeletal: Strength & Muscle Tone: seen lying down in bed Gait & Station: seen lying down in bed Patient leans: see above  Psychiatric Specialty Exam: Review of Systems  Unable to perform ROS: mental acuity    Blood pressure 98/67, pulse 84, resp. rate 16, SpO2 100 %.There is no height or weight on file to calculate BMI.  General Appearance: Disheveled  Eye  Contact::  Minimal  Speech:  Blocked and Slow  Volume:  Decreased  Mood:  unable to obtain, patient is confused , altered Mental status.  Affect:  Blunt, Restricted and confused  Thought Process:  Linear  Orientation:  Other:  unable  to obtain, answers"I don't know"  Thought Content:  unable to obtain  Suicidal Thoughts:  No  Homicidal Thoughts:  No  Memory:  Immediate;   Poor Recent;   Poor Remote;   Poor  Judgement:  Poor  Insight:  Lacking  Psychomotor Activity:  Psychomotor Retardation  Concentration:  Poor  Recall:  Poor  Fund of Knowledge:Poor  Language: minimal.  Answers" yes and no"  Akathisia:  No  Handed:  Right  AIMS (if indicated):     Assets:  Others:  unable to obtain at this time.  Needs housing, transportation, financial assistance  ADL's:  Impaired  Cognition: Impaired,  Severe  Sleep:      Treatment Plan Summary: Daily contact with patient to assess and evaluate symptoms and progress in treatment and Medication management  Disposition: Recommend psychiatric Inpatient admission when medically cleared. Accepted for inpatient Psychiatric admission.  We will seek placement at any facility with available bed.  We will start Risperdal 0.5 mg po bid for Mood controi.  Cogentin 0.5 mg po bid for  EPS.  Trazodone 50 mg po qhs.  Delfin Gant 01/20/2015 12:11 PM

## 2015-01-20 NOTE — BH Assessment (Signed)
Assessment Note  Justin [name] is an 24 y.o. male that presented to [facility] via EMS initially as a "[name]".  Per EMS patient displayed altered mental status. EMS reported that they were called to a business by a bystander. The bystander reported that patient was laying on the ground for multiple hours. On EMS arrival, police officers had him sitting up. He has been nonverbal but intermittently following commands. Patient also displayed some agitation in the presence of EMS and [employer].   Writer met with patient face to face. He was not oriented but alert. He responded to questions selectively. Patient also slow to respond at times and displayed some symptoms of thought blocking. Patient is a poor historian. Patient does not identify if he is SI, HI, and/or experiencing any AVH's. Writer unable to confirm these details.   Writer will obtain collateral information.    Diagnosis: Psychosis NOS  Past Medical History: No past medical history on file.  No past surgical history on file.  Family History: No family history on file.  Social History:  has no tobacco, alcohol, and drug history on file.  Additional Social History:     CIWA: CIWA-Ar BP: 98/67 mmHg Pulse Rate: 84 COWS:    Allergies: Allergies not on file  Home Medications:  (Not in a hospital admission)  OB/GYN Status:  No LMP for male patient.  General Assessment Data Location of Assessment: [facility] TTS Assessment: In system Is this a Tele or Face-to-Face Assessment?: Face-to-Face Is this an Initial Assessment or a Re-assessment for this encounter?: Initial Assessment Marital status:  (unk) Maiden name:  (n/a) Is patient pregnant?: No Pregnancy Status: No Living Arrangements: Other (Comment) (unk) Can pt return to current living arrangement?:  (unk) Admission Status: Advertising account planner type:  (None Reported)     Crisis Care Plan Living Arrangements: Other (Comment) (unk) Legal Guardian:  (unk) Name of Psychiatrist:   (unk) Name of Therapist:  (unk)  Education Status Is patient currently in school?:  (unk) Current Grade:  (unk) Highest grade of school patient has completed:  (unk) Name of school:  (unk) Contact person:  (unk)  Risk to self with the past 6 months Suicidal Ideation:  (unk) Has patient been a risk to self within the past 6 months prior to admission? :  (unk) Suicidal Intent:  (unk) Has patient had any suicidal intent within the past 6 months prior to admission? :  (unk) Is patient at risk for suicide?:  (unk) Suicidal Plan?:  (unk) Has patient had any suicidal plan within the past 6 months prior to admission? :  (unk) Access to Means:  (unk) What has been your use of drugs/alcohol within the last 12 months?:  (unk) Previous Attempts/Gestures:  (unk) How many times?:  (unk) Other Self Harm Risks:  (unk) Triggers for Past Attempts: Unknown Intentional Self Injurious Behavior:  (unk) Family Suicide History:  (unk) Recent stressful life event(s):  (unk) Persecutory voices/beliefs?:  (unk) Depression:  (unk') Depression Symptoms:  (unk) Substance abuse history and/or treatment for substance abuse?:  (unk) Suicide prevention information given to non-admitted patients:  (unk)  Risk to Others within the past 6 months Homicidal Ideation:  (unk) Does patient have any lifetime risk of violence toward others beyond the six months prior to admission? : Unknown Thoughts of Harm to Others:  (unk) Current Homicidal Intent:  (unk) Current Homicidal Plan:  (unk) Access to Homicidal Means:  (unk) Identified Victim:  (unk) History of harm to others?: No Assessment of  Violence: None Noted Violent Behavior Description:  (Patient is calm and cooperative) Does patient have access to weapons?:  (unk) Criminal Charges Pending?:  (unk) Does patient have a court date:  (unk) Is patient on probation?: Unknown  Psychosis Hallucinations:  (unk) Delusions:  (unk)  Mental Status  Report Appearance/Hygiene: Unable to Assess Eye Contact: Unable to Assess Motor Activity: Unable to assess Speech: Unable to assess Level of Consciousness: Unable to assess Mood: Labile Affect: Inconsistent with thought content Anxiety Level:  (UTA) Thought Processes: Irrelevant, Circumstantial, Tangential Judgement: Impaired Orientation: Not oriented Obsessive Compulsive Thoughts/Behaviors: Unable to Assess  Cognitive Functioning Concentration: Unable to Assess Memory: Unable to Assess IQ:  (unk) Insight: Unable to Assess Impulse Control: Unable to Assess Appetite:  (patient eating breakfast during TTS consult) Weight Loss:  (unk) Weight Gain:  (unk) Sleep: Unable to Assess Total Hours of Sleep:  (n/a) Vegetative Symptoms: Unable to Assess  ADLScreening [facility] Assessment Services) Patient's cognitive ability adequate to safely complete daily activities?: Yes Patient able to express need for assistance with ADLs?: Yes Independently performs ADLs?: Yes (appropriate for developmental age)  Prior Inpatient Therapy Prior Inpatient Therapy:  (unk) Prior Therapy Dates:  (unk) Prior Therapy Facilty/Provider(s):  (unk) Reason for Treatment:  (unk)  Prior Outpatient Therapy Prior Outpatient Therapy:  (unk) Prior Therapy Dates:  (unk) Prior Therapy Facilty/Provider(s):  (unk) Reason for Treatment:  (unk) Does patient have an ACCT team?: Unknown Does patient have Intensive In-House Services?  : Unknown Does patient have [facility] services? : Unknown Does patient have P4CC services?: Unknown  ADL Screening (condition at time of admission) Patient's cognitive ability adequate to safely complete daily activities?: Yes Is the patient deaf or have difficulty hearing?: No Does the patient have difficulty seeing, even when wearing glasses/contacts?: No Does the patient have difficulty concentrating, remembering, or making decisions?: No Patient able to express need for assistance with  ADLs?: Yes Does the patient have difficulty dressing or bathing?: No Independently performs ADLs?: Yes (appropriate for developmental age) Does the patient have difficulty walking or climbing stairs?: No Weakness of Legs: None Weakness of Arms/Hands: None  Home Assistive Devices/Equipment Home Assistive Devices/Equipment: None    Abuse/Neglect Assessment (Assessment to be complete while patient is alone) Physical Abuse: Denies Verbal Abuse: Denies Sexual Abuse: Denies Exploitation of patient/patient's resources: Denies Self-Neglect: Denies Values / Beliefs Cultural Requests During Hospitalization: None Spiritual Requests During Hospitalization: None   Advance Directives (For Healthcare) Does patient have an advance directive?: No    Additional Information 1:1 In Past 12 Months?:  (UTA) CIRT Risk:  (UTA) Elopement Risk:  (UTA) Does patient have medical clearance?: Yes (yes)     Disposition:  Disposition Initial Assessment Completed for this Encounter: Yes Disposition of Patient: Inpatient treatment program  On Site Evaluation by:   Reviewed with Physician:    Waldon Merl [facility] 01/20/2015 11:05 AM

## 2015-01-20 NOTE — Progress Notes (Signed)
CSW seeking inpatient Psych bed(s) for patient.   [facility]   Justin GroveBrynn [name] [name]  Justin [name], MSW, [place]  [phone]

## 2015-01-20 NOTE — Progress Notes (Signed)
Pt remains asleep but now with blanket over his head

## 2015-01-20 NOTE — ED Notes (Signed)
[facility] Expected date: 01/20/15 Expected time: 6:23 AM Means of arrival:  Comments: Hold for [employer]

## 2015-01-20 NOTE — ED Notes (Signed)
Pt became restless and combative with CT Tech and exam was unable to be completed.

## 2015-01-20 NOTE — BH Assessment (Signed)
[name]  Per Carolanne GrumblingGerald Taylor, MD, this pt requires psychiatric hospitalization at this time, and he is incapable of consenting for treatment.  Dr Ladona Ridgelaylor has initiated IVC.  Petition [place] and at [employer]:12 Magistrate Antonelli confirms receipt of documents.  Service of Findings and Custody Order is pending as of this writing.  This Clinical research associatewriter will seek placement for the pt.  Doylene Canninghomas Shakeeta Godette, MA Triage Specialist [phone]

## 2015-01-20 NOTE — BH Assessment (Addendum)
Patient is a poor historian. Writer has tried to contact various numbers listed below for collateral information. The #'s were found in patient's chart and/or provided by outside sources. All the numbers contacted below were not viable numbers for this patient: [phone]
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
## 2015-01-20 NOTE — ED Notes (Signed)
Patient has been mute most of the day.  Early this morning he told me that he had no name and he was never born.  Denies any need to be in the hospital.  [facility]  All contents of locker given to the officer.  Patient refused to sign the belongings sheet or discharge form.

## 2015-01-21 ENCOUNTER — Encounter (HOSPITAL_COMMUNITY): Payer: Self-pay | Admitting: Emergency Medicine

## 2015-01-21 DIAGNOSIS — F29 Unspecified psychosis not due to a substance or known physiological condition: Secondary | ICD-10-CM

## 2015-01-21 DIAGNOSIS — R040 Epistaxis: Secondary | ICD-10-CM | POA: Diagnosis present

## 2015-01-21 DIAGNOSIS — F09 Unspecified mental disorder due to known physiological condition: Secondary | ICD-10-CM

## 2015-01-21 MED ORDER — PHENYLEPHRINE HCL 0.5 % NA SOLN
1.0000 [drp] | Freq: Once | NASAL | Status: AC
  Administered 2015-01-21: 1 [drp] via NASAL
  Filled 2015-01-21: qty 15

## 2015-01-21 MED ORDER — SILVER NITRATE-POT NITRATE 75-25 % EX MISC
1.0000 | Freq: Once | CUTANEOUS | Status: AC
  Administered 2015-01-21: 1 via TOPICAL
  Filled 2015-01-21: qty 1

## 2015-01-21 MED ORDER — LORAZEPAM 2 MG/ML IJ SOLN: 1.0000 mg | Freq: Four times a day (QID) | INTRAMUSCULAR | Status: DC | PRN

## 2015-01-21 MED ORDER — HALOPERIDOL 2 MG PO TABS
2.0000 mg | ORAL_TABLET | Freq: Three times a day (TID) | ORAL | Status: DC
  Administered 2015-01-21 (×2): 2 mg via ORAL
  Filled 2015-01-21 (×6): qty 1

## 2015-01-21 MED ORDER — LORAZEPAM 1 MG PO TABS
1.0000 mg | ORAL_TABLET | Freq: Four times a day (QID) | ORAL | Status: DC | PRN
  Administered 2015-01-21 – 2015-01-25 (×3): 1 mg via ORAL
  Filled 2015-01-21 (×4): qty 1

## 2015-01-21 MED ORDER — ENSURE ENLIVE PO LIQD
237.0000 mL | Freq: Three times a day (TID) | ORAL | Status: DC
Start: 2015-01-21 — End: 2015-02-04
  Administered 2015-01-22 – 2015-02-04 (×30): 237 mL via ORAL

## 2015-01-21 NOTE — [facility] Group Notes (Signed)
[facility] LCSW Group Therapy  01/21/2015 1:15 pm  Type of Therapy: Process Group Therapy  Participation Level: In bed with covers pulled over head.  Would not respond when I invited him to join us.  Summary of Progress/Problems: Today's group addressed the issue of overcoming obstacles.  Patients were asked to identify their biggest obstacle post d/c that stands in the way of their on-going success, and then problem solve as to how to manage this.  Justin [name], Justin [name] 01/21/2015   3:04 PM
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
## 2015-01-21 NOTE — ED Provider Notes (Signed)
CSN: [id number]     Arrival date & time 01/21/15  0831 History   First MD Initiated Contact with Patient 01/21/15 [id number]     Chief Complaint  Patient presents with  . Epistaxis     (Consider location/radiation/quality/duration/timing/severity/associated sxs/prior Treatment) HPI Comments: 24 year old male presents from behavioral health for epistaxis. At 6:30 this morning the patient was noted to be bleeding from his right near. At behavioral health he packed his nose twice with gauze and then removed it but the patient continued to bleed and so they sent him to the emergency department. The patient is not communicating or answering questions which appears to be his psychiatric [facility]. He is not providing any history.   History reviewed. No pertinent past medical history. History reviewed. No pertinent past surgical history. History reviewed. No pertinent family history. Social History  Substance Use Topics  . Smoking status: None  . Smokeless tobacco: None  . Alcohol Use: None    Review of Systems  Unable to perform ROS: Psychiatric disorder      Allergies  Review of patient's allergies indicates not on file.  Home Medications   Prior to Admission medications   Not on File   BP 110/67 mmHg  Pulse 93  Temp(Src) 98.6 F (37 C) (Oral)  Resp 18  Ht 5' 5.75" (1.67 m)  Wt 131 lb (59.421 kg)  BMI 21.31 kg/m2  SpO2 99% Physical Exam  Constitutional: He is oriented to person, place, and time. He appears well-developed and well-nourished. No distress.  HENT:  Head: Normocephalic and atraumatic.  Right Ear: External ear normal.  Left Ear: External ear normal.  Nose: Epistaxis (friable area of tissue that appears to have previously been bleeding along the rightside of Kiesselbach's plexus; no active bleeding on examination) is observed.  Mouth/Throat: Oropharynx is clear and moist. No oropharyngeal exudate.  Eyes: EOM are normal. Pupils are  equal, round, and reactive to light.  Neck: Normal range of motion. Neck supple.  Cardiovascular: Normal rate, regular rhythm, normal heart sounds and intact distal pulses.   No murmur heard. Pulmonary/Chest: Effort normal. No respiratory distress. He has no wheezes. He has no rales.  Abdominal: Soft. He exhibits no distension. There is no tenderness.  Musculoskeletal: He exhibits no edema.  Neurological: He is alert and oriented to person, place, and time.  Skin: Skin is warm and dry. No rash noted. He is not diaphoretic.  Vitals reviewed.   ED Course  .Epistaxis Management Date/Time: 01/21/2015 9:16 AM Performed by: Tyrone AppleNGUYEN, Haadi Santellan ROE Authorized by: Leta BaptistNGUYEN, Muhanad Torosyan ROE Required items: required blood products, implants, devices, and special equipment available Patient identity confirmed: arm band Patient sedated: no Treatment site: right Kiesselbach's area Repair method: silver nitrate Post-procedure assessment: bleeding stopped Treatment complexity: simple Patient tolerance: Patient tolerated the procedure well with no immediate complications Comments: Patient tolerated procedure well without complication.   (including critical care time) Labs Review Labs Reviewed - No data to display  Imaging Review Ct Head Wo Contrast  01/20/2015  CLINICAL DATA:  Altered mental status. EXAM: CT HEAD WITHOUT CONTRAST TECHNIQUE: Contiguous axial images were obtained from the base of the skull through the vertex without intravenous contrast. COMPARISON:  None. FINDINGS: Skull and Sinuses:Negative for fracture or destructive process. The visualized mastoids, middle ears, and imaged paranasal sinuses are clear. Visualized orbits: Negative. Brain: No evidence of acute infarction, hemorrhage, hydrocephalus, or mass lesion/mass effect. Generalized prominence of sulci for age. IMPRESSION: No acute intracranial finding. Electronically Signed  By: Marnee Spring M.D.   On: 01/20/2015 04:14   I have  personally reviewed and evaluated these images and lab results as part of my medical decision-making.   EKG Interpretation None      MDM  Patient seen and evaluated in stable condition.  Bleeding controlled. Afrin applied to naris. Area of friable tissue that appears to have been bleeding over the right Kiesselbach plexus. Silver nitrate applied as detailed above. Patient discharged back to behavioral health in stable condition. Instruction was given not to place anything in the nares or tried to pack. Instructions given to pinch the bridge of the nose and apply pressure for 10 minutes of this recurred. ENT follow-up was given as needed. Patient to return with uncontrolled bleeding. Final diagnoses:  Epistaxis    1. Epistaxis    Leta Baptist, MD 01/21/15 [phone]

## 2015-01-21 NOTE — ED Notes (Signed)
MD at bedside. 

## 2015-01-21 NOTE — Discharge Instructions (Signed)
You were seen today for your nosebleed.  This is currently controlled.  If this happens again DO NOT put anything in your nose or try to pack it with gauze - this will make bleeding worse.  Sit up right lean slightly forward and pinch at the bridge of your nose for 10 minutes without checking for bleeding.  You can blow your nose before applying pressure to remove clots.  Clots are not uncommon during nose bleeds and sometimes the continued bleeding is just oozing from clot.  If this reoccurs and cannot be controlled return to the emergency department.  You can follow up with ENT if you continue to have controllable nosebleeds.  Nosebleed Nosebleeds are common. They are due to a crack in the inside lining of your nose (mucous membrane) or from a small blood vessel that starts to bleed. Nosebleeds can be caused by many conditions, such as injury, infections, dry mucous membranes or dry climate, medicines, nose picking, and home heating and cooling systems. Most nosebleeds come from blood vessels in the front of your nose. HOME CARE INSTRUCTIONS   Try controlling your nosebleed by pinching your nostrils gently and continuously for at least 10 minutes.  Avoid blowing or sniffing your nose for a number of hours after having a nosebleed.  Do not put gauze inside your nose yourself. If your nose was packed by your health care provider, try to maintain the pack inside of your nose until your health care provider removes it.  If a gauze pack was used and it starts to fall out, gently replace it or cut off the end of it.  If a balloon catheter was used to pack your nose, do not cut or remove it unless your health care provider has instructed you to do that.  Avoid lying down while you are having a nosebleed. Sit up and lean forward.  Use a nasal spray decongestant to help with a nosebleed as directed by your health care provider.  Do not use petroleum jelly or mineral oil in your nose. These can drip into  your lungs.  Maintain humidity in your home by using less air conditioning or by using a humidifier.  Aspirinand blood thinners make bleeding more likely. If you are prescribed these medicines and you suffer from nosebleeds, ask your health care provider if you should stop taking the medicines or adjust the dose. Do not stop medicines unless directed by your health care provider  Resume your normal activities as you are able, but avoid straining, lifting, or bending at the waist for several days.  If your nosebleed was caused by dry mucous membranes, use over-the-counter saline nasal spray or gel. This will keep the mucous membranes moist and allow them to heal. If you must use a lubricant, choose the water-soluble variety. Use it only sparingly, and do not use it within several hours of lying down.  Keep all follow-up visits as directed by your health care provider. This is important. SEEK MEDICAL CARE IF:  You have a fever.  You get frequent nosebleeds.  You are getting nosebleeds more often. SEEK IMMEDIATE MEDICAL CARE IF:  Your nosebleed lasts longer than 20 minutes.  Your nosebleed occurs after an injury to your face, and your nose looks crooked or broken.  You have unusual bleeding from other parts of your body.  You have unusual bruising on other parts of your body.  You feel light-headed or you faint.  You become sweaty.  You vomit blood.  Your nosebleed occurs after a head injury.   This information is not intended to replace advice given to you by your health care provider. Make sure you discuss any questions you have with your health care provider.   Document Released: 10/25/2004 Document Revised: 02/05/2014 Document Reviewed: 08/31/2013 Elsevier Interactive Patient Education [employer]2016 Elsevier Inc.

## 2015-01-21 NOTE — [facility] Suicide Risk Assessment (Signed)
[facility]   Nursing information obtained from:    Demographic factors:    Current Mental Status:    Loss Factors:    Historical Factors:    Risk Reduction Factors:    Total Time spent with patient: 30 minutes Principal Problem: Psychosis Diagnosis:   Patient Active Problem List   Diagnosis Date Noted  . Psychosis [F29] 01/21/2015  . Altered mental status [R41.82]      Continued Clinical Symptoms:    The "Alcohol Use Disorders Identification Test", Guidelines for Use in Primary Care, Second Edition.  World Science writerHealth Organization [facility](WHO). Score between 0-7:  no or low risk or alcohol related problems. Score between 8-15:  moderate risk of alcohol related problems. Score between 16-19:  high risk of alcohol related problems. Score 20 or above:  warrants further diagnostic evaluation for alcohol dependence and treatment.   CLINICAL FACTORS:   pt presented mute - seen as responding to internal stimuli.    Psychiatric Specialty Exam: Physical Exam  Review of Systems  Unable to perform ROS: mental acuity    Blood pressure 110/67, pulse 93, temperature 98.6 F (37 C), temperature source Oral, resp. rate 18, height 5' 5.75" (1.67 m), weight 59.421 kg (131 lb), SpO2 99 %.Body mass index is 21.31 kg/(m^2).                    Please see H&P.                                      COGNITIVE FEATURES THAT CONTRIBUTE TO RISK:  Closed-mindedness, Loss of executive function, Polarized thinking and Thought constriction (tunnel vision)    SUICIDE RISK:   Likely Moderate however pt is mute :  Frequent suicidal ideation with limited intensity, and duration, some specificity in terms of plans, no associated intent, good self-control, limited dysphoria/symptomatology, some risk factors present, and identifiable protective factors, including available and accessible social support.  PLAN OF CARE: Please see H&P.   Medical Decision Making:   Review of Psycho-Social Stressors (1), Review or order clinical lab tests (1), Decision to obtain old records (1), Review and summation of old records (2), Established Problem, Worsening (2), Review of Last Therapy Session (1), Review of Medication Regimen & Side Effects (2) and Review of New Medication or Change in Dosage (2)  I certify that inpatient services furnished can reasonably be expected to improve the patient's condition.   Simuel Stebner MD 01/21/2015, 1:12 PM

## 2015-01-21 NOTE — Progress Notes (Signed)
1:1 Nursing Note  Pt still not responding to any questions, and still not receptive to staff. Lying in bed with eyes closed. Normal respirations. Stable. Continues on 1:1 for patient safety. Will continue to monitor.

## 2015-01-21 NOTE — Progress Notes (Addendum)
Patient ID: Justin BartersRobert E Kimpel, male   DOB: [birth year], 24 y.o.   MRN: [id number]   1:1 Nursing Note  D: Pt still not responding to any questions, and still not receptive to staff. Pt continues to be very paranoid and continues to be responding to internal stimuli. Pt also continues to be having some thought blocking. Pt would not eat or drink, despite staff encouraging him to do so. A: 1:1 continued for patient safety. R: Pt safety maintained.

## 2015-01-21 NOTE — Progress Notes (Signed)
Patient ID: Justin BartersRobert E [name], male   DOB: Jan 21, 1991, 24 y.o.   MRN: [id number]  Night nurse gave report on patient, she reported that patient had been bleeding from his nose since 0630. Nurse reported that she spoke to NP and that orders were given, and that all interventions done did not work. Pt was assessed he was in bathroom bleeding over the toilet, he also had blood clots coming from his nose. This Clinical research associatewriter applied a cool clothe and had pt to pinch his nose in an effort to stop the bleeding. Pt continued to saturate wash clothes. The patient was seen by NP, orders noted to send pt to ER. Report was called to Darl PikesSusan the Press photographercharge nurse. Pts vitals were : Sitting BP110/67 HR93 Resp 18, Standing BP110/59 HR115 Resp 18.

## 2015-01-21 NOTE — ED Notes (Signed)
[facility] called, Report called.

## 2015-01-21 NOTE — ED Notes (Signed)
[facility] nosebleed for over 1 hour. No relief.

## 2015-01-21 NOTE — Progress Notes (Signed)
Patient ID: Justin BartersRobert E [name], male   DOB: [birth year], 24 y.o.   MRN: [id number]   1:1 Nursing Note  D: Pt from the ER due to nose bleed. Pt would not respond to any questions, and was not receptive to staff. Pt appeared  to be very paranoid and also appeared to be responding to internal stimuli. Pt also seemed to be having some thought blocking. Pt would not eat or drink, despite staff encouraging him to do so. Due to patient not responding verbally to staff and not eating/drinking, Dr Elna BreslowEappen placed patient on a 1:1. A: 1:1 started for patient safety. R: Pt safety maintained.
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
## 2015-01-21 NOTE — Progress Notes (Signed)
Approximately 630 am I entered Justin [name]'s room and observed that he was profusely bleeding from his nose. I packed both nares with gauze and proceeded to pinch the bridge of his nose. 10 minutes later I pulled one gauze from the left nares and there was no bleeding. Upon pulling the gauze from the right nares a large blood clot was noted to be extracted as well and then the right nares began bleeding profusely. On call provider was notified. New orders given.  I immediately placed another piece of 4x4 gauze into the R nares and placed a cold compress upon the bridge of his nose and will continue to monitor and assess for cessation of bleeding. Justin [name] continues to still be minimally conversant and responds intermittently only yes or no answers.

## 2015-01-21 NOTE — ED Notes (Signed)
Bleeding controlled.

## 2015-01-21 NOTE — Progress Notes (Signed)
D: Initially at the onset of shift. I introduced myself to [name] while he was lying in his bed. He looked directly at me and did not respond. I asked him if he was Gwynneth Macleodobert Beckett and again he did not respond. I asked him did he speak [place] and he shook his head no. I knew this to not be true due to the previous assessment notes. I explained to him that I was his nurse for the night shift and I was aware that he does speak english. I asked him if he needed anything and he closed his eyes. Several hours later I again went to Olis's room to reintroduce myself as well as inquire if he was going to comply with taking his pm medications. This time he was slightly more receptive. He nodded his head regarding complying with taking his medications. After giving him his medications I asked him was he hungry and would he like something to drink. He stated yes. We walked to the dayroom and he requested fruit punch and applesauce. He denies pain, SI/HI/AVH. I was unable to elicit any additional conversation from [place] after this.  A: Q15 minute checks for patient safety. Medications administered as prescribed. Encouragement and support given.  R: Continue to monitor for patient safety and medication effectiveness.

## 2015-01-21 NOTE — Progress Notes (Signed)
Unable to assess for PSA.  Selectively mute.

## 2015-01-21 NOTE — ED Notes (Signed)
Bed: WA15 Expected date:  Expected time:  Means of arrival:  Comments: EMS nosebleed 

## 2015-01-22 ENCOUNTER — Encounter (HOSPITAL_COMMUNITY): Payer: Self-pay | Admitting: Psychiatry

## 2015-01-22 DIAGNOSIS — E538 Deficiency of other specified B group vitamins: Secondary | ICD-10-CM | POA: Clinically undetermined

## 2015-01-22 DIAGNOSIS — E038 Other specified hypothyroidism: Secondary | ICD-10-CM | POA: Clinically undetermined

## 2015-01-22 LAB — TSH: TSH: 0.187 u[IU]/mL — ABNORMAL LOW (ref 0.350–4.500)

## 2015-01-22 LAB — RPR: RPR: NONREACTIVE

## 2015-01-22 LAB — FOLATE: FOLATE: 12.2 ng/mL (ref >5.9)

## 2015-01-22 LAB — VITAMIN B12: VITAMIN B 12: 123 pg/mL — AB (ref 180–914)

## 2015-01-22 MED ORDER — BENZTROPINE MESYLATE 0.5 MG PO TABS
0.5000 mg | ORAL_TABLET | Freq: Two times a day (BID) | ORAL | Status: DC
  Administered 2015-01-24: 0.5 mg via ORAL
  Filled 2015-01-22 (×6): qty 1

## 2015-01-22 MED ORDER — BENZTROPINE MESYLATE 1 MG/ML IJ SOLN
0.5000 mg | Freq: Two times a day (BID) | INTRAMUSCULAR | Status: DC
Start: 2015-01-22 — End: 2015-01-22

## 2015-01-22 MED ORDER — HALOPERIDOL 2 MG PO TABS: 2.0000 mg | ORAL_TABLET | Freq: Two times a day (BID) | ORAL | Status: DC

## 2015-01-22 MED ORDER — HALOPERIDOL LACTATE 5 MG/ML IJ SOLN
2.0000 mg | Freq: Two times a day (BID) | INTRAMUSCULAR | Status: DC
  Administered 2015-01-22 – 2015-01-23 (×3): 2 mg via INTRAMUSCULAR
  Filled 2015-01-22 (×6): qty 0.4

## 2015-01-22 MED ORDER — BENZTROPINE MESYLATE 1 MG/ML IJ SOLN
0.5000 mg | Freq: Two times a day (BID) | INTRAMUSCULAR | Status: DC
  Administered 2015-01-22 – 2015-01-23 (×3): 0.5 mg via INTRAMUSCULAR
  Filled 2015-01-22 (×6): qty 0.5

## 2015-01-22 MED ORDER — HALOPERIDOL LACTATE 5 MG/ML IJ SOLN: 2.0000 mg | Freq: Two times a day (BID) | INTRAMUSCULAR | Status: DC

## 2015-01-22 MED ORDER — BENZTROPINE MESYLATE 0.5 MG PO TABS: 0.5000 mg | ORAL_TABLET | Freq: Two times a day (BID) | ORAL | Status: DC

## 2015-01-22 MED ORDER — HALOPERIDOL 2 MG PO TABS
2.0000 mg | ORAL_TABLET | Freq: Two times a day (BID) | ORAL | Status: DC
  Administered 2015-01-24: 2 mg via ORAL
  Filled 2015-01-22 (×6): qty 1

## 2015-01-22 MED ORDER — CYANOCOBALAMIN 1000 MCG/ML IJ SOLN: 1000.0000 ug | INTRAMUSCULAR | Status: DC

## 2015-01-22 MED ORDER — CYANOCOBALAMIN 1000 MCG/ML IJ SOLN
1000.0000 ug | INTRAMUSCULAR | Status: DC
  Administered 2015-01-29: 1000 ug via INTRAMUSCULAR
  Filled 2015-01-22: qty 1

## 2015-01-22 MED ORDER — CYANOCOBALAMIN 1000 MCG/ML IJ SOLN
1000.0000 ug | Freq: Every day | INTRAMUSCULAR | Status: AC
  Administered 2015-01-22 – 2015-01-28 (×6): 1000 ug via INTRAMUSCULAR
  Filled 2015-01-22 (×7): qty 1

## 2015-01-22 NOTE — Progress Notes (Signed)
1:1Note: Patient maintained on close observation for safety.  Patient up and out of his room and in the dayroom.  100% of supper taken with adequate fluid intake.  Patient in the dayroom watching TV.  No interaction with staff and peers.  Close observation continues.

## 2015-01-22 NOTE — Progress Notes (Signed)
Adult Psychoeducational Group Note  Date:  01/22/2015 Time:  8:15 PM  Group Topic/Focus:  Wrap-Up Group:   The focus of this group is to help patients review their daily goal of treatment and discuss progress on daily workbooks.  Participation Level:  Did Not Attend  Pt was asleep during wrap-up group.    Cleotilde NeerJasmine S Alaylah Heatherington 01/22/2015, 8:39 PM

## 2015-01-22 NOTE — Progress Notes (Signed)
1:1 Note: Patient maintained on close observation for safety.  Patient lying on the floor in his bathroom.  Patient remained on the floor after several encouragement to get off the floor.  Patient would not respond to staff questions or assessment.  Patient received IM haldol and Cogentin for agitation. Sitter reported patient kicking at the door and throwing things at the sitter and calling sitter the "B" word.  Patient is in his bed sleeping.  Refused lunch.

## 2015-01-22 NOTE — Progress Notes (Signed)
1:1 NURSING NOTE   Pt still not verbally responding to any questions, and still not receptive to staff. Lying in bed with eyes closed. Stable. Continues on 1:1 for patient safety. Will continue to monitor.

## 2015-01-22 NOTE — Clinical Social Work Psychosocial (Signed)
Clinical Social Work Note  Attempted to meet with pt to do psychosocial assessment.  He was lying in the floor in the bathroom, was extremely malodorous, would not respond to any questions.  Per staff he has refused to answer any questions at all, refused a pillow although in very uncomfortable location, refused breakfast.  The Psychosocial Assessment will need to be attempted again on Monday 01/24/15.  Ambrose MantleMareida Grossman-Orr, LCSW 01/22/2015, 9:52 AM

## 2015-01-22 NOTE — Progress Notes (Signed)
1:1 NURSING NOTE   Pt still not verbally responding to any questions, and still not receptive to staff. Lying in bed with eyes closed. Stable. Continues on 1:1 for patient safety. Will continue to monitor.  
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
## 2015-01-22 NOTE — [facility] Group Notes (Signed)
[facility] Group Notes: (Clinical Social Work)   01/22/2015      Type of Therapy:  Group Therapy   Participation Level:  Did Not Attend despite MHT prompting   Rakim Moone Grossman-Orr, LCSW 01/22/2015, 12:40 PM     

## 2015-01-22 NOTE — Progress Notes (Signed)
1: 1 Note: Patient is not responding to any assessment questions.  Refused all morning medications after several encouragement.  Patient laying on the floor in the bathroom.  Appetite poor.  Refused breakfast and ensure.  Continued with constant supervision.

## 2015-01-23 LAB — T4, FREE: FREE T4: 1.09 ng/dL (ref 0.61–1.12)

## 2015-01-23 NOTE — Progress Notes (Signed)
1:1 Notes  Pt at this time is in bed resting. Pt does not look to be in any distress at this time. 1:1 staff is present in room with Pt at this time. 1:1 monitoring continues for Pt's safety. Pt would not answer any questions (elective mutism). 15-minute safety checks also continues at this time

## 2015-01-23 NOTE — Progress Notes (Addendum)
1:1 Notes  Pt at this time is in bed resting with eyes closed. Pt does not look to be in any distress at this time. 1:1 staff is present in room with Pt at this time. 1:1 monitoring continues for Pt's safety. 15-minute safety checks also continues at this time

## 2015-01-23 NOTE — Progress Notes (Signed)
1:1 note:  Pt refusing to communicate with this nurse. Pt refusing morning PO medication, Reported by MHT that pt ate 90% of breakfast. Pt encouraged to give urine sample, specimen cup provided. Remains on 1:1 for safety. Assigned MHT present. Will continue to monitor.
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
## 2015-01-23 NOTE — Progress Notes (Signed)
1:1 note:  Pt presents laying in his assigned bed, appears to be sleeping. No s/s of acute distress noted. Breathing non labored and respirations equal. Pt ate 50% of dinner. Pt refused morning PO medication, IM medication given per MD order. Assigned nursing staff present. Will continue to monitor on 1:1 for safety.

## 2015-01-23 NOTE — Progress Notes (Signed)
1:1 note:  Pt presents laying in his assigned bed, eyes open. Continues to refuse to communicate with this nurse and other nursing staff. Pt refused PO medication, IM medication given per MD order. Assigned nursing staff at bedside. Pt remains on 1:1 for safety. Will continue to monitor.

## 2015-01-24 LAB — T3: T3 TOTAL: 100 ng/dL (ref 71–180)

## 2015-01-24 LAB — T4: T4 TOTAL: 8.7 ug/dL (ref 4.5–12.0)

## 2015-01-24 MED ORDER — ALUM & MAG HYDROXIDE-SIMETH 200-200-20 MG/5ML PO SUSP: 30.0000 mL | ORAL | Status: DC | PRN

## 2015-01-24 MED ORDER — LEVOTHYROXINE SODIUM 25 MCG PO TABS
25.0000 ug | ORAL_TABLET | Freq: Every day | ORAL | Status: DC
  Administered 2015-01-25 – 2015-01-27 (×3): 25 ug via ORAL
  Filled 2015-01-24 (×5): qty 1

## 2015-01-24 MED ORDER — ACETAMINOPHEN 325 MG PO TABS: 650.0000 mg | ORAL_TABLET | ORAL | Status: DC | PRN

## 2015-01-24 MED ORDER — ONDANSETRON HCL 4 MG PO TABS: 4.0000 mg | ORAL_TABLET | Freq: Three times a day (TID) | ORAL | Status: DC | PRN

## 2015-01-24 MED ORDER — BENZTROPINE MESYLATE 1 MG PO TABS
1.0000 mg | ORAL_TABLET | Freq: Two times a day (BID) | ORAL | Status: DC
  Administered 2015-01-24 – 2015-02-04 (×22): 1 mg via ORAL
  Filled 2015-01-24 (×3): qty 1
  Filled 2015-01-24: qty 14
  Filled 2015-01-24 (×4): qty 1
  Filled 2015-01-24: qty 14
  Filled 2015-01-24 (×17): qty 1

## 2015-01-24 MED ORDER — HALOPERIDOL LACTATE 5 MG/ML IJ SOLN
5.0000 mg | Freq: Two times a day (BID) | INTRAMUSCULAR | Status: DC
  Filled 2015-01-24 (×6): qty 1

## 2015-01-24 MED ORDER — BENZTROPINE MESYLATE 1 MG/ML IJ SOLN
1.0000 mg | Freq: Two times a day (BID) | INTRAMUSCULAR | Status: DC
  Filled 2015-01-24 (×14): qty 1
  Filled 2015-01-24: qty 2
  Filled 2015-01-24 (×12): qty 1

## 2015-01-24 MED ORDER — HALOPERIDOL 5 MG PO TABS
5.0000 mg | ORAL_TABLET | Freq: Two times a day (BID) | ORAL | Status: DC
  Administered 2015-01-24 – 2015-01-25 (×2): 5 mg via ORAL
  Filled 2015-01-24 (×6): qty 1

## 2015-01-24 NOTE — Progress Notes (Signed)
1-1 monitoring note. D. Patient remains on 1.1. For safety. He continues to be mute with Clinical research associatewriter on the unit. He has had no interaction with peers, remains isolative to his room. He is eating his meals, ate 100% of breakfast. A. 1.1 staff at arms length. R. Patient is safe

## 2015-01-24 NOTE — Progress Notes (Signed)
1:1 note:  Pt presents laying in his assigned bed, eyes open. Continues to refuse to communicate with this nurse and other nursing staff. Pt refused PO medication.  Assigned nursing staff at bedside. Pt remains on 1:1 for safety. Will continue to monitor.  

## 2015-01-24 NOTE — [facility] Group Notes (Signed)
[facility] LCS[facility] Aftercare Discharge Planning Group Note   01/24/2015 1:35 PM  Participation Quality:  Pt invited. DID NOT ATTEND. Pt continues to be selectively mute; refusing to engage verbally with CSW/staff.   Smart, Jennavie Martinek LCSW

## 2015-01-24 NOTE — Progress Notes (Signed)
Pt accepted evening medications and writer noted pt attempted to cheek putting medications under his tongue , when he was offered more water to ensure pills were swallowed he poured water all over his mouth and shirt. He appeared responding to internal stimuli, remained non verbal. He was given additional cup of water and mouth checked again with MHT no pills noted. Pt has eaten 100% of his meals and has adequate fluid intake. He continues to appear bizarre and not verbally responding to staff. Will con't to monitor as ordered. Pt is safe.

## 2015-01-24 NOTE — Progress Notes (Signed)
1-1 monitoring note. D. Patient remains on 1.1. For safety. He continues to be mute with Clinical research associatewriter on the unit. He has had no interaction with peers, remains isolative to his room.He remains non verbal with staff. He is eating his meals, ate 100% of lunch. A. 1.1 staff at arms length. R. Patient is safe

## 2015-01-24 NOTE — Progress Notes (Signed)
1:1 note:  Pt presents laying in his assigned bed, eyes open. Continues to refuse to communicate with this nurse and other nursing staff. Pt refused PO medication.  Assigned nursing staff at bedside. Pt remains on 1:1 for safety. Will continue to monitor.

## 2015-01-24 NOTE — Progress Notes (Signed)
Progress note at 12/25 at 09:28 should have been timed at 21:08.

## 2015-01-24 NOTE — Progress Notes (Signed)
LCSW attempted to contact Justin MorJohn [name] [phone], however a male answered the phone and reports that there is no one of that name connected to that number.  Male explained that she has received several phone calls [facility] over the last few days and asked that [facility] staff stop calling her as staff has the wrong number.  Tessa LernerLeslie M. Sharise Lippy, MSW, LCSW 1:04 PM 01/24/2015

## 2015-01-24 NOTE — Progress Notes (Signed)
Pt in bed supervised 1:1.  Awake, eyes open, refuses to speak.  Does not respond to verbal stimulation.  Will not take ensure,  Dinner by bed does not appear to have been eaten.
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
## 2015-01-24 NOTE — Progress Notes (Addendum)
1-1 monitoring note.  D. Patient remains 1.1. For safety. Pt continues to be selectively mute. When Clinical research associatewriter approached he was in the bed, he did not respond verbally to Clinical research associatewriter, when Clinical research associatewriter went to shake his arm to arouse pt (as his face was covered with blanket) pt jerked his arm away and then got up out of bed. He did come to medication window but would not answer any assessment questions from this Clinical research associatewriter. He appears irritable, agitated, suspicious. He started mumbling to himself at medication window. He accepted medications. A. 1.1. At arms length. R. Pt is safe, will con't to monitor 1.1. As ordered.

## 2015-01-24 NOTE — Progress Notes (Signed)
1 - 1 note D. Pt up in dayroom at this time, pt presents with minimal eye contact and no interaction with peers or staff. Pt has refused ensure this evening, did not verbalize any complaints, did however appear to be responding internally. A. 1 -1 continued at this time. R. Safety maintained, will continue to monitor.

## 2015-01-25 DIAGNOSIS — F203 Undifferentiated schizophrenia: Secondary | ICD-10-CM

## 2015-01-25 MED ORDER — HALOPERIDOL LACTATE 5 MG/ML IJ SOLN
5.0000 mg | Freq: Three times a day (TID) | INTRAMUSCULAR | Status: DC
  Filled 2015-01-25 (×14): qty 1

## 2015-01-25 MED ORDER — HALOPERIDOL 5 MG PO TABS
5.0000 mg | ORAL_TABLET | Freq: Three times a day (TID) | ORAL | Status: DC
  Administered 2015-01-25 – 2015-01-28 (×9): 5 mg via ORAL
  Filled 2015-01-25 (×14): qty 1

## 2015-01-25 NOTE — Plan of Care (Signed)
Problem: Alteration in mood Goal: STG-Patient is able to discuss feelings and issues (Patient is able to discuss feelings and issues leading to depression)  Outcome: Not Progressing Pt remains isolative, irritable and no interaction with staff or peers. Encourage patient to attend, and administer prn medications as ordered.

## 2015-01-25 NOTE — Progress Notes (Signed)
1-1 monitoring note.  D. Patient remains isolative to his room. He continues to lay in the bed, not responding verbally to staff. He is not attending to any ADL's and is very malodorous, although he is eating and drinking well. Pt remains with 1.1. Sitter . Respiration even and unlabored. Please see paper flow sheets for further 1.1. Documentation. R. Pt is safe, will continue to monitor as ordered.

## 2015-01-25 NOTE — Progress Notes (Signed)
1 - 1 note D: Pt in room and resting comfortably at this time. Respirations even and unlabored and pt does not appear to be in any acute distress. A 1 -1 continues at this time. R. Safety maintained, will continue to monitor. 

## 2015-01-25 NOTE — Progress Notes (Signed)
1-1 monitoring note.  D. Patient remains isolative to his room. He continues to lay in the bed, not responding verbally to staff. He is isolative to his room throughout shift, and has not attended any unit programming. He is not attending to any ADL's and remains very malodorous, although he continues to eat and drink well.  Pt remains with 1.1. Sitter  During medication pass at this time pt accepted medications but then threw his plastic pill cup back into the window at Emerson Electricwriter. He was given prn ativan for agitation.  Respiration even and unlabored.  R. Pt is safe, will continue to monitor as ordered.

## 2015-01-25 NOTE — [facility] Counselor (Signed)
PSA's have been attempted several times since admission but have not been completed as patient has been mute and there is no collateral contact information in chart for family.   After speaking with treatment team staff at this time, it appears that patient remains mostly mute and uncooperative. He has also been physically and verbally combative with nursing staff this morning. CSW will attempt PSA at a later time when patient is stable and cooperative. CSW continuing to follow for support and disposition planning.   Samuella BruinKristin Anasophia Pecor, MSW, [employer]CS[employer] Clinical Social Worker [facility] [phone]

## 2015-01-25 NOTE — Progress Notes (Signed)
1:1 note-  D: Pt is in his room, lying in bed with eyes closed.  Respirations are even and unlabored and pt does not appear to be in any acute distress.  A: 1:1 monitoring continues at this time.   R: Pt is safe, will continue to monitor and assess.

## 2015-01-25 NOTE — [facility] Group Notes (Signed)
[facility] Group Notes:  (Nursing/MHT/Case Management/Adjunct)  Date:  01/25/2015  Time:  10:29 AM  Type of Therapy:  Psychoeducational Skills  Participation Level:  Did Not Attend  Participation Quality:  na  Affect:  na  Cognitive:  na  Insight:  None  Engagement in Group:  na  Modes of Intervention:  na  Summary of Progress/Problems:   Justin [name], Justin [name] 01/25/2015, 10:29 AM

## 2015-01-25 NOTE — Progress Notes (Signed)
1-1 monitoring note.  D. Pt remains on 1.1. Upon approach this morning patient had his head covered with blanket. He would not respond to writers voice after several minutes of trying to rouse him to speak to him. He would not uncover his face . Writer informed pt that needed to give injection of B12 (per MD order see mar) and informed pt would turn on light. Attempted to touch pt arm to wake patient and he pulled covers from face abruptly and then stated '' Don't fucking touch me '' He was informed of the injection and agreed, turning his hip so that writer could administer. Pt informed would need to move pants for injection site and he stated '' i'll do it '' then after medication given patient attempted to swing at this writer. Informed this was inappropriate behavior, and staff here for support he stated '' you need to fucking go on bitch, you're not supposed to be in here. '' Patient remains very malodorous. 1.1. At arms length per order. R. Pt is safe, will con't to monitor as ordered.

## 2015-01-25 NOTE — Progress Notes (Signed)
1 - 1 note D: Pt in room and resting comfortably at this time. Respirations even and unlabored and pt does not appear to be in any acute distress. A 1 -1 continues at this time. R. Safety maintained, will continue to monitor.

## 2015-01-25 NOTE — [facility] Group Notes (Signed)
[facility] LCSW Group Therapy 01/25/2015  1:15 PM   Type of Therapy: Group Therapy  Participation Level: Did Not Attend. Patient invited to participate but declined.   Kalden Wanke, MSW, LCSWA Clinical Social Worker [facility] [phone]   

## 2015-01-25 NOTE — Progress Notes (Signed)
1:1 note-  D: Pt remains on 1:1 for safety.  He is still not responding to staff verbally.  He has not been completing his ADL's and he was aggressive earlier today per report.  He is laying in his bed in his room.  Pt does not appear to be in acute distress.   A: Medication administered per order.  Encouraged pt to notify staff of needs and concerns.  Pt provided with pitcher of water.  1:1 monitoring continues for safety.   R: Pt was hesitant to take HS medication, but was compliant with medication administration.  Will continue to monitor and assess for safety.
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
## 2015-01-26 LAB — BASIC METABOLIC PANEL
ANION GAP: 10 (ref 5–15)
BUN: 18 mg/dL (ref 6–20)
CALCIUM: 9.3 mg/dL (ref 8.9–10.3)
CO2: 29 mmol/L (ref 22–32)
CREATININE: 0.98 mg/dL (ref 0.61–1.24)
Chloride: 103 mmol/L (ref 101–111)
Glucose, Bld: 90 mg/dL (ref 65–99)
Potassium: 4 mmol/L (ref 3.5–5.1)
Sodium: 142 mmol/L (ref 135–145)

## 2015-01-26 NOTE — [facility] Group Notes (Signed)
[facility] LCSW Aftercare Discharge Planning Group Note  01/26/2015  8:45 AM  Participation Quality: Did Not Attend. Patient invited to participate but declined.  Gurney Balthazor, MSW, LCSWA Clinical Social Worker [facility] [phone]    

## 2015-01-26 NOTE — Progress Notes (Signed)
1:1 note:  Pt does not communicate with this nurse. Pt does look at this nurse when spoken too. Pt took morning medication without difficulty. Mental health tech at bedside. Reported that pt ate breakfast. Pt encouraged to get OOB. Remains on 1:1 for safety.

## 2015-01-26 NOTE — Progress Notes (Signed)
1:1 note-  D: Pt is in his room, lying in bed with eyes closed.  Respirations are even and unlabored and pt does not appear to be in any acute distress.  A: 1:1 monitoring continues at this time.   R: Pt is safe, will continue to monitor and assess.   

## 2015-01-26 NOTE — Progress Notes (Signed)
1:1 note-  D: Pt is resting in his room with his eyes closed.  Respirations are even and unlabored.  Pt does not appear to be in any acute distress.  When pt was awake earlier tonight, he remained non verbal with Clinical research associatewriter.  Pt has body odor and he has not completed ADL's this shift.  He has stayed in his room so far tonight.  He did follow writer's requests to sit up and take his medication.  Pt opens his eyes and looks at staff when he is addressed.    A: Medication administered per order.  PO fluids encouraged and provided.  Pt is being monitored 1:1 for safety.  Encouraged pt to inform staff of needs and concerns.    R: Pt is safe on the unit.  He was compliant with Ensure and scheduled Trazodone.  Will continue to monitor and assess for safety.

## 2015-01-26 NOTE — B[facility] Counselor (Signed)
CSW attempted to complete PSA. Patient refused to speak and did not respond to CSW interaction.   Samuella BruinKristin Eudell Julian, MSW, [employer] Clinical Social Worker [facility] [phone]
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
## 2015-01-26 NOTE — Progress Notes (Addendum)
1:1 note:  Pt continues to not communicate with this nurse. Pt does open his eyes when spoken too and blink. Pt compliant with PO medication regimen. Body odor noted. Assigned nursing staff at bedside. Reported that pt ate 50% of dinner. Will continue to monitor on 1:1 for safety.
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
## 2015-01-26 NOTE — [facility] Group Notes (Signed)
[facility] LCSW Group Therapy 01/26/2015  1:15 PM   Type of Therapy: Group Therapy  Participation Level: Did Not Attend. Patient invited to participate but declined.   Shayon Trompeter, MSW, LCSWA Clinical Social Worker [facility] [phone]   

## 2015-01-26 NOTE — Progress Notes (Signed)
1:1 DAR Note: Pt remains on 1:1 with sitter by side.  He continues to be non verbal.  His is currently resting in his bed with his eyes open.  Poor eye contact.  He would not eat supper this evening.  Poor hygiene.  1:1 remains for safety.  Justin [name] remains safe on the unit.

## 2015-01-27 DIAGNOSIS — G3184 Mild cognitive impairment, so stated: Secondary | ICD-10-CM

## 2015-01-27 LAB — CBC WITH DIFFERENTIAL/PLATELET
Basophils Absolute: 0 10*3/uL (ref 0.0–0.1)
Basophils Relative: 0 %
EOS ABS: 0.2 10*3/uL (ref 0.0–0.7)
Eosinophils Relative: 3 %
HEMATOCRIT: 36.2 % — AB (ref 39.0–52.0)
HEMOGLOBIN: 12.6 g/dL — AB (ref 13.0–17.0)
LYMPHS ABS: 3.2 10*3/uL (ref 0.7–4.0)
Lymphocytes Relative: 47 %
MCH: 29.4 pg (ref 26.0–34.0)
MCHC: 34.8 g/dL (ref 30.0–36.0)
MCV: 84.4 fL (ref 78.0–100.0)
MONOS PCT: 7 %
Monocytes Absolute: 0.5 10*3/uL (ref 0.1–1.0)
NEUTROS ABS: 3 10*3/uL (ref 1.7–7.7)
NEUTROS PCT: 43 %
Platelets: 228 10*3/uL (ref 150–400)
RBC: 4.29 MIL/uL (ref 4.22–5.81)
RDW: 13.4 % (ref 11.5–15.5)
WBC: 6.8 10*3/uL (ref 4.0–10.5)

## 2015-01-27 LAB — CK: Total CK: 195 U/L (ref 49–397)

## 2015-01-27 NOTE — Progress Notes (Signed)
1:1 note  Pt compliant with morning medication regimen. Pt continues to not communicate with nursing staff, but responds to simple request from staff. Pt ate 80% of breakfast. Pt invited to morning nursing group, but did not attend. Assigned MHT at bedside. Will continue to monitor on 1:1 for safety.

## 2015-01-27 NOTE — Progress Notes (Signed)
Patient ID: Justin [name], male   DOB: [birth year], 24 y.o.   MRN: [id number] [facility] MD Progress Note  01/27/2015 10:00 AM Justin [name]  MRN:  [id number] Subjective: Patient  Remains selectively mute.  Objective: I have discussed case with treatment team and have met with patient along with RN. As discussed with staff, patient remains selectively mute, but there has been improvement compared to admission. He has not exhibited any further aggressive or assaultive behaviors , he has been compliant with medications, and today he allowed blood draw. He has been eating his meals , drinking fluids, and has been up from bed to go to bathroom. He refuses to answer any questions and keeps eyes closed, although he does open them at times. He appears to understand what is spoken as he sometimes appears to shake his head when asked questions. I asked for him to allow me to examine  His forearm, arm  for cog-wheeling or rigidity as he is on Haldol, and he verbalized " don't touch me". This was his only vocalization. Does not appear stiff, rigid, and vitals are stable, no fever. Labs  BMP unremarkable. Of note, recent Head CT negative . Recent TSH low, but T3/T4 WNL.    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 Vitamin B12 deficiency [E53.8] 01/22/2015  . 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 Psychosis [F29] 01/21/2015  . Cognitive disorder [F09] 01/21/2015  . Epistaxis [R04.0] 01/21/2015  . Altered mental status [R41.82]    Total Time spent with patient: 20 minutes  Past Psychiatric History: unknown - please see H&P  Past Medical History: History reviewed. No pertinent past medical history. History reviewed. No pertinent past surgical history. Family History:  Family History  Problem Relation Age of Onset  . Family history unknown: Yes   Family Psychiatric   History: unknown mental health hx Social History: Pt is homeless, did stay at [facility] in the past.  History  Alcohol Use: Not on file     History  Drug Use Not on file    Social History   Social History  . Marital Status: Single    Spouse Name: N/A  . Number of Children: N/A  . Years of Education: N/A   Social History Main Topics  . Smoking status: Unknown If Ever Smoked  . Smokeless tobacco: None  . Alcohol Use: None  . Drug Use: None  . Sexual Activity: Not Asked   Other Topics Concern  . None   Social History Narrative   Additional Social History:   Sleep:  Improved  as observed  Appetite:  improving Current Medications: Current Facility-Administered Medications  Medication Dose Route Frequency Provider Last Rate Last Dose  . acetaminophen (TYLENOL) tablet 650 mg  650 mg Oral Q4H PRN Delfin Gant, NP      . alum & mag hydroxide-simeth (MAALOX/MYLANTA) 200-200-20 MG/5ML suspension 30 mL  30 mL Oral PRN Delfin Gant, NP      . benztropine (COGENTIN) tablet 1 mg  1 mg Oral BID Benjamine Mola, FNP   1 mg at 01/27/15 0745   Or  . benztropine mesylate (COGENTIN) injection 1 mg  1 mg Intramuscular BID Benjamine Mola, FNP      .  cyanocobalamin ((VITAMIN B-12)) injection 1,000 mcg  1,000 mcg Intramuscular Daily Ursula Alert, MD   1,000 mcg at 01/27/15 0752   Followed by  . [START ON 01/29/2015] cyanocobalamin ((VITAMIN B-12)) injection 1,000 mcg  1,000 mcg Intramuscular Q7 days Ursula Alert, MD       Followed by  . [START ON 02/26/2015] cyanocobalamin ((VITAMIN B-12)) injection 1,000 mcg  1,000 mcg Intramuscular Q30 days Saramma Eappen, MD      . feeding supplement (ENSURE ENLIVE) (ENSURE ENLIVE) liquid 237 mL  237 mL Oral TID BM Saramma Eappen, MD   237 mL at 01/27/15 1403  . haloperidol (HALDOL) tablet 5 mg  5 mg Oral TID Niel Hummer, NP   5 mg at 01/27/15 1132   Or  . haloperidol lactate (HALDOL) injection 5 mg  5 mg Intramuscular TID Niel Hummer, NP       . levothyroxine (SYNTHROID, LEVOTHROID) tablet 25 mcg  25 mcg Oral QAC breakfast Benjamine Mola, FNP   25 mcg at 01/27/15 6283  . LORazepam (ATIVAN) tablet 1 mg  1 mg Oral Q6H PRN Ursula Alert, MD   1 mg at 01/25/15 1602   Or  . LORazepam (ATIVAN) injection 1 mg  1 mg Intramuscular Q6H PRN Ursula Alert, MD      . ondansetron (ZOFRAN) tablet 4 mg  4 mg Oral Q8H PRN Delfin Gant, NP      . traZODone (DESYREL) tablet 50 mg  50 mg Oral QHS Delfin Gant, NP   50 mg at 01/26/15 2100    Lab Results:  Results for orders placed or performed during the hospital encounter of 01/20/15 (from the past 48 hour(s))  Basic metabolic panel     Status: None   Collection Time: 01/26/15  6:27 PM  Result Value Ref Range   Sodium 142 135 - 145 mmol/L   Potassium 4.0 3.5 - 5.1 mmol/L   Chloride 103 101 - 111 mmol/L   CO2 29 22 - 32 mmol/L   Glucose, Bld 90 65 - 99 mg/dL   BUN 18 6 - 20 mg/dL   Creatinine, Ser 0.98 0.61 - 1.24 mg/dL   Calcium 9.3 8.9 - 10.3 mg/dL   GFR calc non Af Amer >60 >60 mL/min   GFR calc Af Amer >60 >60 mL/min    Comment: (NOTE) The eGFR has been calculated using the CKD EPI equation. This calculation has not been validated in all clinical situations. eGFR's persistently <60 mL/min signify possible Chronic Kidney Disease.    Anion gap 10 5 - 15    Comment: Performed at [facility]    Physical Findings: AIMS: Facial and Oral Movements Muscles of Facial Expression: None, normal Lips and Perioral Area: None, normal Jaw: None, normal Tongue: None, normal,Extremity Movements Upper (arms, wrists, hands, fingers): None, normal Lower (legs, knees, ankles, toes): None, normal, Trunk Movements Neck, shoulders, hips: None, normal, Overall Severity Severity of abnormal movements (highest score from questions above): None, normal Incapacitation due to abnormal movements: None, normal Patient's awareness of abnormal movements (rate only patient's  report): No Awareness, Dental Status Current problems with teeth and/or dentures?: No Does patient usually wear dentures?: No  CIWA:    COWS:     Musculoskeletal: Strength & Muscle Tone: within normal limits Gait & Station: normal Patient leans: N/A  Psychiatric Specialty Exam: Review of Systems  Unable to perform ROS: mental acuity  Psychiatric/Behavioral: Positive for hallucinations.       Pt is  mute, psychotic, will not respond   All other systems reviewed and are negative.   Blood pressure 99/53, pulse 83, temperature 97.7 F (36.5 C), temperature source Oral, resp. rate 16, height 5' 5.75" (1.67 m), weight 131 lb (59.421 kg), SpO2 99 %.Body mass index is 21.31 kg/(m^2).  General Appearance: Disheveled  Eye Contact::  Minimal  Speech:  mute   Volume:  mute  Mood:  does not repsond  Affect:  irritable  Thought Process:  Difficult to assess as selectively mute   Orientation:  Other:  mute  Thought Content:  is mute- appears to be responding to internal stimuli  Suicidal Thoughts:  does not respond- has not exhibited any self injurious behaviors   Homicidal Thoughts:  does not respond  Memory:  unable to assess  Judgement:  Impaired  Insight:  Lacking  Psychomotor Activity:  Decreased- but getting out of bed for bathroom   Concentration:  Poor  Recall:  Poor  Fund of Knowledge:Poor  Language: Poor  Akathisia:  No    AIMS (if indicated):     Assets:  Others:  access to health care  ADL's:  Impaired  Cognition: Impaired,  Moderate  Sleep:  Number of Hours: 6.75   Assessment - patient selectively mute, negativistic, but improving as noted by ability to cooperate with vitals signs, blood draw, improved PO intake . No evidence of rigidity or stiffness, but does not allow physical examination. Vitals stable, no fever, and BMP results within normal. Tolerating Haldol titration well thus far .  Daily contact with patient to assess and evaluate symptoms and progress in  treatment and Medication management  Continue Haldol 5 mg IM tid /po tid - PATIENT IS CURRENTLY ON FORCED MEDICATIONS- PLEASE SEE DR.JOnnalagada's notes in chart. Continue  Cogentin 1 mg po bid for EPS. D/C Levothyroxine  As thyroid function tests not suggestive of hypothyroidism at this time Will continue Ativan 1 mg po q6h prn po/IM for severe anxiety/agitation. Continue 1;1 for patient safety. Continue B12 supplementation     Neita Garnet,  MD  01/27/2015, 15:25

## 2015-01-27 NOTE — [facility] Group Notes (Signed)
[facility] LCSW Group Therapy 01/27/2015  1:15 PM   Type of Therapy: Group Therapy  Participation Level: Did Not Attend. Patient invited to participate but declined.   Gennette Shadix, MSW, LCSWA Clinical Social Worker [facility] [phone]   

## 2015-01-27 NOTE — Progress Notes (Signed)
Psychoeducational Group Note  Date:  01/27/2015 Time:  2113  Group Topic/Focus:  Wrap-Up Group:   The focus of this group is to help patients review their daily goal of treatment and discuss progress on daily workbooks.  Participation Level: Did Not Attend  Participation Quality:  Not Applicable  Affect:  Not Applicable  Cognitive:  Not Applicable  Insight:  Not Applicable  Engagement in Group: Not Applicable  Additional Comments:  The patient did not attend group this evening.   Hazle CocaGOODMAN, Shayonna Ocampo S 01/27/2015, 9:13 PM

## 2015-01-27 NOTE — Progress Notes (Signed)
1:1 note-  D: Pt is currently using the restroom.  Prior to this, he has been resting in his bed.  Pt does not appear to be in acute distress.  Pt is still not communicating with staff.   A: 1:1 monitoring continues at this time.   R: Pt is safe.  Will continue to monitor and assess.

## 2015-01-27 NOTE — Progress Notes (Signed)
1:1 note:  Pt presents lying in his assigned bed, eyes open. Continues to not communicate with this nurse, but follows simple commands. Pt compliant with prescribed medication regimen. MHT at bedside. MHT reports pt ate 100% of dinner. Pt in bed this shift, encouraged to get OOB. Remains on 1:1 for safety. Will continue to monitor.

## 2015-01-27 NOTE — Progress Notes (Addendum)
2200 1:1 Progress Note D: Patient in his room in bed on approach.  Patient still electively mute.  Patient did shake his head no to snack.  Patient did follow all prompts from Clinical research associatewriter.  Patient did take medications and shook his head yes to his birthdate.  Patient took all snacks from Clinical research associatewriter and ate them.  Patient did not answer when writer asked about SI/HI or AVH.   A: Staff to monitor Q 15 mins for safety.  Encouragement and support offered.  Scheduled medications administered per orders.  No prn medications administered.  Patient remains on 1:! For safety. R: Patient remains safe on the unit.  Patient did not attend group tonight.  Patient not visible on the unit.  Patient taking administered medications

## 2015-01-27 NOTE — Progress Notes (Signed)
1:1 note-  D: Pt has been resting in his bed since last note.  He opens his eyes when writer calls his name.  Pt responds to requests by Clinical research associatewriter but he is still not speaking to staff.    A: Morning medication administered per order.  1:1 monitoring continues at this time.    R: Pt is compliant with morning medication.  He is safe on the unit.  Will continue to monitor and assess.

## 2015-01-27 NOTE — BHH Counselor (Signed)
Patient continues to be mute at this time.   Samuella BruinKristin Lanee Chain, MSW, [employer]CSWA Clinical Social Worker [facility] [phone]

## 2015-01-27 NOTE — Progress Notes (Signed)
1:1 note  Pt presents laying in his assigned bed, eyes open. Pt continues to not communicate with nursing staff, but follows simple directions. Compliant with medication regimen. MHT reports pt ate 25% of lunch. Ensure given as ordered and pt drinking. Assigned MHT at bedside. Remains on 1:1 for safety. Will continue to monitor.

## 2015-01-28 MED ORDER — HALOPERIDOL 5 MG PO TABS
10.0000 mg | ORAL_TABLET | Freq: Two times a day (BID) | ORAL | Status: DC
  Administered 2015-01-29 – 2015-02-04 (×13): 10 mg via ORAL
  Filled 2015-01-28 (×8): qty 2
  Filled 2015-01-28: qty 28
  Filled 2015-01-28 (×4): qty 2
  Filled 2015-01-28: qty 28
  Filled 2015-01-28 (×3): qty 2

## 2015-01-28 NOTE — Clinical Social Work Note (Signed)
PASARR submitted for possible ALF placement.  [facility].  APS report initiated today via after hours worker [phone]). Awaiting call back. Per [facility], patient's SSN is [id number]. Online demographics completed w [facility].  Santa GeneraAnne Francely Craw, LCSW Lead Clinical Social Worker Phone:  [phone]

## 2015-01-28 NOTE — BHH Group Notes (Signed)
Patient did not attend group.  Angle Dirusso, MHT 

## 2015-01-28 NOTE — NC FL2 (Signed)
[place] MEDICAID FL2 LEVEL OF CARE SCREENING TOOL     IDENTIFICATION  Patient Name: Justin BartersRobert E Caradonna Birthdate: Jun 20, 1990 Sex: male Admission Date (Current Location): 01/20/2015  [facility] [place]   Facility and Address:   [facility], [address], [place]  [id number]      Provider Number: [id number]  Attending Physician Name and Address:  No att. providers found  Relative Name and Phone Number:  none    Current Level of Care: Hospital Recommended Level of Care: Assisted Living Facility Prior Approval Number:    Date Approved/Denied:   PASRR Number:    Discharge Plan: Domiciliary (Rest home)    Current Diagnoses: Patient Active Problem List   Diagnosis Date Noted  . Vitamin B12 deficiency 01/22/2015  . 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 Psychosis 01/21/2015  . Cognitive disorder 01/21/2015  . Epistaxis 01/21/2015  . Altered mental status     Orientation RESPIRATION BLADDER Height & Weight    Self, Time, Situation, Place  Normal Continent 5\' 5"  (165.1 cm) 131 lbs.  BEHAVIORAL SYMPTOMS/MOOD NEUROLOGICAL BOWEL NUTRITION STATUS  Other (Comment) (selectively mute, uncommunicative)   Continent Diet  AMBULATORY STATUS COMMUNICATION OF NEEDS Skin   Supervision Does not communicate Normal                       Personal Care Assistance Level of Assistance  Bathing, Feeding, Dressing Bathing Assistance: Limited assistance Feeding assistance: Limited assistance Dressing Assistance: Limited assistance     Functional Limitations Info  Sight, Hearing, Speech Sight Info: Adequate Hearing Info: Adequate Speech Info: Impaired    SPECIAL CARE FACTORS FREQUENCY                       Contractures Contractures Info: Not present    Additional Factors Info  Psychotropic     Psychotropic Info: See MAR         Current Medications (01/28/2015):  This is the current hospital active medication list Current  Facility-Administered Medications  Medication Dose Route Frequency Provider Last Rate Last Dose  . acetaminophen (TYLENOL) tablet 650 mg  650 mg Oral Q4H PRN Earney NavyJosephine C Onuoha, NP      . alum & mag hydroxide-simeth (MAALOX/MYLANTA) 200-200-20 MG/5ML suspension 30 mL  30 mL Oral PRN Earney NavyJosephine C Onuoha, NP      . benztropine (COGENTIN) tablet 1 mg  1 mg Oral BID Beau FannyJohn C Withrow, FNP   1 mg at 01/28/15 1658   Or  . benztropine mesylate (COGENTIN) injection 1 mg  1 mg Intramuscular BID Beau FannyJohn C Withrow, FNP      . cyanocobalamin ((VITAMIN B-12)) injection 1,000 mcg  1,000 mcg Intramuscular Daily Jomarie LongsSaramma Eappen, MD   1,000 mcg at 01/28/15 0817   Followed by  . [START ON 01/29/2015] cyanocobalamin ((VITAMIN B-12)) injection 1,000 mcg  1,000 mcg Intramuscular Q7 days Jomarie LongsSaramma Eappen, MD       Followed by  . [START ON 02/26/2015] cyanocobalamin ((VITAMIN B-12)) injection 1,000 mcg  1,000 mcg Intramuscular Q30 days Jomarie LongsSaramma Eappen, MD      . feeding supplement (ENSURE ENLIVE) (ENSURE ENLIVE) liquid 237 mL  237 mL Oral TID BM Saramma Eappen, MD   237 mL at 01/28/15 1332  . [START ON 01/29/2015] haloperidol (HALDOL) tablet 10 mg  10 mg Oral BID Rockey SituFernando A Cobos, MD      . LORazepam (ATIVAN) tablet 1 mg  1 mg Oral Q6H PRN Saramma  Eappen, MD   1 mg at 01/25/15 1602   Or  . LORazepam (ATIVAN) injection 1 mg  1 mg Intramuscular Q6H PRN Saramma Eappen, MD      . ondansetron (ZOFRAN) tablet 4 mg  4 mg Oral Q8H PRN Earney Navy, NP      . traZODone (DESYREL) tablet 50 mg  50 mg Oral QHS Earney Navy, NP   50 mg at 01/27/15 2145     Discharge Medications: Please see discharge summary for a list of discharge medications.  Relevant Imaging Results:  Relevant Lab Results:   Additional Information No allergies on file  Sallee Lange, [place]

## 2015-01-28 NOTE — Clinical Social Work Note (Signed)
CS[facility] spoke w [facility] - pt has no service history w them other than hospitalization approx 2 years ago [facility].  Had care coordinator at one point, but not currently.  No further information available.  [facility], [facility].    Santa GeneraAnne Kaylee Wombles, LCSW Lead Clinical Social Worker Phone:  [phone]

## 2015-01-28 NOTE — Progress Notes (Signed)
Nursing Note: 0700-1900  D:  Pt remains selectively mute, has lied in bed throughout shift until dinner time, when he walked down the hall to the group room to watch tv.  Pt is cooperative with taking all meds offered, ensure shakes taken and partial meals consumed.  Pt does not answer any questions asked at this time  A:  Encouraged to verbalize needs and concerns, active listening and support provided.  Continued 1:1 safety sitter along w Q 15 minute safety checks.  Observed active participation in group settings.  R:  Pt. remains non-verbal, unable to assess hallucinations or contract for safety at this time.

## 2015-01-28 NOTE — Progress Notes (Addendum)
Patient 1:1 Note D:Patient in the dayroom watching television on first approach.  Patient did look at Clinical research associatewriter as Clinical research associatewriter spoke to him but he did not verbally respond.  Writer introduced self along with tonights plan and mediations scheduled for tonight.  Patient left th dayroom after Clinical research associatewriter spoke.  Patient did accept his medications but would not shake his head yes or no to questions asked.  Patient did take snacks and medications from nurse.  Writer asked patient about SI/HI and AVH burt patient did not respond.  Same response when asked about pain.   A: Staff to monitor Q 15 mins for safety.  Encouragement and support offered.  Scheduled medications administered per orders. R: Patient remains safe on the unit.  Patient did not attend group tonight. Patient visible on the unit until group was about to start.  Patient taking administered medications

## 2015-01-28 NOTE — BHH Group Notes (Signed)
BHH LCSW Group Therapy 01/28/2015  1:15 PM   Type of Therapy: Group Therapy  Participation Level: Did Not Attend. Patient invited to participate but declined.   Annaston Upham, MSW, LCSWA Clinical Social Worker [facility] [phone]   

## 2015-01-28 NOTE — Progress Notes (Signed)
Nursing 1:1 Note D: Patient resting in bed with eyes closed.  Respirations even and unlabored.  Patient appears to be in no apparent distress. A: Staff to monitor Q 15 mins for safety.  Patient remains on 1:1 for safety. R:Patient remains safe on the unit.  

## 2015-01-28 NOTE — BHH Group Notes (Signed)
BHH Group Notes:  (Nursing/MHT/Case Management/Adjunct)  Date:  01/28/2015  Time:  12:07 PM  Type of Therapy:  Nurse Education  Participation Level:  Did Not Attend  Participation Quality:  n/a  Affect:  n/a  Cognitive:  n/a  Insight:  n/a  Engagement in Group:  n/a  Modes of Intervention:  n/a  Summary of Progress/Problems: Nursing psychoeducational group discussion of relapse prevention and healthy coping skills.  Karren BurlyMain, Tommy Goostree Katherine 01/28/2015, 12:07 PM

## 2015-01-28 NOTE — Progress Notes (Addendum)
Patient ID: Justin [name], male   DOB: [birth year], 24 y.o.   MRN: [id number] [facility] MD Progress Note  01/28/2015 10:00 AM BRADIN MCADORY  MRN:  [id number] Subjective: Patient remains selectively mute.   Objective: I have discussed case with treatment team and have met with patient along with RN. Improvement compared to admission- as reported by nursing staff eating meals, drinking fluids, up from bed to go to bathroom, allowing vitals to be taken and labs to be drawn without becoming agitated or irritable. However, remains selectively mute, and  Continues to spend most time in bed, except for going to bathroom. Today eyes are open , but moves head to avoid eye contact. Does seem to be listening to what is asked, said, and has some non verbal responses- for example, when asked if he was feeling better today, he shrugged. Not allowing physical examination but does not appear stiff or  rigid, and vitals remain stable, no fever.  CK level within normal limits. Repeat EKG within normal limits, no QTc prolongation. Recent   BMP unremarkable. BP 99/53, pulse 83.  I have discussed case with Dr. Sabra Heck, colleague, and Dr. Sabra Heck has also seen patient. Agrees with ongoing Haldol management / titration .  Of note, staff /CSW have made attempts to obtain collateral information, to include calling  Contact numbers listed in chart- CSW stated number was incorrect, person who answered did not know patient.  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 Vitamin B12 deficiency [E53.8] 01/22/2015  . 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 Psychosis [F29] 01/21/2015  . Cognitive disorder [F09] 01/21/2015  . Epistaxis [R04.0] 01/21/2015  . Altered mental status [R41.82]    Total Time spent with patient: 20 minutes  Past Psychiatric History: unknown - please see H&P  Past Medical  History: History reviewed. No pertinent past medical history. History reviewed. No pertinent past surgical history. Family History:  Family History  Problem Relation Age of Onset  . Family history unknown: Yes   Family Psychiatric  History: unknown mental health hx Social History: Pt is homeless, did stay at [facility] in the past.  History  Alcohol Use: Not on file     History  Drug Use Not on file    Social History   Social History  . Marital Status: Single    Spouse Name: N/A  . Number of Children: N/A  . Years of Education: N/A   Social History Main Topics  . Smoking status: Unknown If Ever Smoked  . Smokeless tobacco: None  . Alcohol Use: None  . Drug Use: None  . Sexual Activity: Not Asked   Other Topics Concern  . None   Social History Narrative   Additional Social History:   Sleep:  Improved  as observed  Appetite:  improving Current Medications: Current Facility-Administered Medications  Medication Dose Route Frequency Provider Last Rate Last Dose  . acetaminophen (TYLENOL) tablet 650 mg  650 mg Oral Q4H PRN Delfin Gant, NP      . alum & mag hydroxide-simeth (MAALOX/MYLANTA) 200-200-20 MG/5ML suspension 30 mL  30 mL Oral PRN Delfin Gant, NP      . benztropine (COGENTIN) tablet 1 mg  1 mg  Oral BID John C Withrow, FNP   1 mg at 01/28/15 0815   Or  . benztropine mesylate (COGENTIN) injection 1 mg  1 mg Intramuscular BID John C Withrow, FNP      . cyanocobalamin ((VITAMIN B-12)) injection 1,000 mcg  1,000 mcg Intramuscular Daily Saramma Eappen, MD   1,000 mcg at 01/28/15 0817   Followed by  . [START ON 01/29/2015] cyanocobalamin ((VITAMIN B-12)) injection 1,000 mcg  1,000 mcg Intramuscular Q7 days Saramma Eappen, MD       Followed by  . [START ON 02/26/2015] cyanocobalamin ((VITAMIN B-12)) injection 1,000 mcg  1,000 mcg Intramuscular Q30 days Saramma Eappen, MD      . feeding supplement (ENSURE ENLIVE) (ENSURE ENLIVE) liquid 237 mL  237 mL Oral TID BM  Saramma Eappen, MD   237 mL at 01/28/15 0817  . haloperidol (HALDOL) tablet 5 mg  5 mg Oral TID Laura A Davis, NP   5 mg at 01/28/15 0816   Or  . haloperidol lactate (HALDOL) injection 5 mg  5 mg Intramuscular TID Laura A Davis, NP      . LORazepam (ATIVAN) tablet 1 mg  1 mg Oral Q6H PRN Saramma Eappen, MD   1 mg at 01/25/15 1602   Or  . LORazepam (ATIVAN) injection 1 mg  1 mg Intramuscular Q6H PRN Saramma Eappen, MD      . ondansetron (ZOFRAN) tablet 4 mg  4 mg Oral Q8H PRN Josephine C Onuoha, NP      . traZODone (DESYREL) tablet 50 mg  50 mg Oral QHS Josephine C Onuoha, NP   50 mg at 01/27/15 2145    Lab Results:  Results for orders placed or performed during the hospital encounter of 01/20/15 (from the past 48 hour(s))  Basic metabolic panel     Status: None   Collection Time: 01/26/15  6:27 PM  Result Value Ref Range   Sodium 142 135 - 145 mmol/L   Potassium 4.0 3.5 - 5.1 mmol/L   Chloride 103 101 - 111 mmol/L   CO2 29 22 - 32 mmol/L   Glucose, Bld 90 65 - 99 mg/dL   BUN 18 6 - 20 mg/dL   Creatinine, Ser 0.98 0.61 - 1.24 mg/dL   Calcium 9.3 8.9 - 10.3 mg/dL   GFR calc non Af Amer >60 >60 mL/min   GFR calc Af Amer >60 >60 mL/min    Comment: (NOTE) The eGFR has been calculated using the CKD EPI equation. This calculation has not been validated in all clinical situations. eGFR's persistently <60 mL/min signify possible Chronic Kidney Disease.    Anion gap 10 5 - 15    Comment: Performed at [facility]  CK     Status: None   Collection Time: 01/27/15  6:40 PM  Result Value Ref Range   Total CK 195 49 - 397 U/L    Comment: Performed at [facility]  CBC with Differential/Platelet     Status: Abnormal   Collection Time: 01/27/15  6:40 PM  Result Value Ref Range   WBC 6.8 4.0 - 10.5 K/uL   RBC 4.29 4.22 - 5.81 MIL/uL   Hemoglobin 12.6 (L) 13.0 - 17.0 g/dL   HCT 36.2 (L) 39.0 - 52.0 %   MCV 84.4 78.0 - 100.0 fL   MCH 29.4 26.0 - 34.0 pg    MCHC 34.8 30.0 - 36.0 g/dL   RDW 13.4 11.5 - 15.5 %   Platelets 228 150 -   400 K/uL   Neutrophils Relative % 43 %   Neutro Abs 3.0 1.7 - 7.7 K/uL   Lymphocytes Relative 47 %   Lymphs Abs 3.2 0.7 - 4.0 K/uL   Monocytes Relative 7 %   Monocytes Absolute 0.5 0.1 - 1.0 K/uL   Eosinophils Relative 3 %   Eosinophils Absolute 0.2 0.0 - 0.7 K/uL   Basophils Relative 0 %   Basophils Absolute 0.0 0.0 - 0.1 K/uL    Comment: Performed at [facility]    Physical Findings: AIMS: Facial and Oral Movements Muscles of Facial Expression: None, normal Lips and Perioral Area: None, normal Jaw: None, normal Tongue: None, normal,Extremity Movements Upper (arms, wrists, hands, fingers): None, normal Lower (legs, knees, ankles, toes): None, normal, Trunk Movements Neck, shoulders, hips: None, normal, Overall Severity Severity of abnormal movements (highest score from questions above): None, normal Incapacitation due to abnormal movements: None, normal Patient's awareness of abnormal movements (rate only patient's report): No Awareness, Dental Status Current problems with teeth and/or dentures?: No Does patient usually wear dentures?: No  CIWA:    COWS:     Musculoskeletal: Strength & Muscle Tone: within normal limits Gait & Station: normal Patient leans: N/A  Psychiatric Specialty Exam: Review of Systems  Unable to perform ROS: mental acuity  Psychiatric/Behavioral: Positive for hallucinations.       Pt is mute, psychotic, will not respond   All other systems reviewed and are negative.   Blood pressure 99/53, pulse 83, temperature 97.7 F (36.5 C), temperature source Oral, resp. rate 16, height 5' 5.75" (1.67 m), weight 131 lb (59.421 kg), SpO2 99 %.Body mass index is 21.31 kg/(m^2).  General Appearance:  Grooming is somewhat better   Eye Contact::  Minimal, but today eyes are open rather than keeping them shut   Speech:  mute   Volume:  mute  Mood:  does not repsond   Affect:  Hard to assess in the absence of speech, but today seems less irritable  Thought Process:  Difficult to assess as selectively mute   Orientation:  Other:  mute  Thought Content:  is mute- appears to be responding to internal stimuli  Suicidal Thoughts:  does not respond- has not exhibited any self injurious behaviors   Homicidal Thoughts:  does not respond  Memory:  unable to assess  Judgement:  Impaired  Insight:  Lacking  Psychomotor Activity:  Decreased- but getting out of bed for bathroom   Concentration:  Poor  Recall:  Poor  Fund of Knowledge:Poor  Language: Poor  Akathisia:  No    AIMS (if indicated):     Assets:  Others:  access to health care  ADL's:  Impaired  Cognition: Impaired,  Moderate  Sleep:  Number of Hours: 6.5   Assessment - patient  Remains selectively mute, negativistic, but  As noted above, improved compared to admission. Preserved PO intake, vitals stable, labs unremarkable, no evidence of muscular rigidity, no fever, CK within normal. Tolerating Haldol well. Dr. Lugo also has seen, evaluated patient and agrees with current management and increasing haldol dose .  Daily contact with patient to assess and evaluate symptoms and progress in treatment and Medication management  Increase  Haldol 10  mg IM bid  /po bid  - PATIENT IS CURRENTLY ON FORCED MEDICATIONS- PLEASE SEE DR.JOnnalagada's notes in chart. Continue  Cogentin 1 mg po bid for EPS. Will continue Ativan 1 mg po q6h prn po/IM for severe anxiety/agitation. Continue 1;1 for patient safety.   Continue B12 supplementation   CSW / team continuing to try to get collateral information.   , ,  MD  01/28/2015, 15:25  

## 2015-01-28 NOTE — Progress Notes (Signed)
1:1 Nursing Note 0200 D: Patient resting in bed with eyes closed.  Respirations even and unlabored.  Patient appears to be in no apparent distress. A: Staff to monitor Q 15 mins for safety.  Patient remains on 1:1 for safety. R:Patient remains safe on the unit.

## 2015-01-28 NOTE — Progress Notes (Signed)
Nursing 1:1 Note: D:  Pt resting in bed, remains non-verbal but cooperative (taking medications as ordered).  A:  Continued Q 15 minute checks for safety, also remains 1:1 for safety. R:  Pt remains safe on the unit.

## 2015-01-28 NOTE — Progress Notes (Signed)
D: Pt. lying in bed with eyes closed, even and non-labored respirations noted. A: 1:1 sitter at bedside for safety. 12 lead EKG obtained. Encouraged pt to void in specimen cup, left in bathroom to remind. R: Pt. remains safe at this time. Pt stated, "Last time they did not take off my shirt."  when this RN was obtaining 12 lead.  Pt is walking to bathroom, but remains in room so far today.

## 2015-01-29 DIAGNOSIS — F29 Unspecified psychosis not due to a substance or known physiological condition: Secondary | ICD-10-CM

## 2015-01-29 NOTE — Progress Notes (Signed)
1:1 Note D:  Pt resting in bed, eyes are closed.  Even and non-labored respirations noted.  Remains non-verbal. Pt cooperative and taking meds as offered. A:  Pt remains 1:1 for safety.  Continued Q 15 minute checks as well.  R:  Pt remain safe on unit.

## 2015-01-29 NOTE — Plan of Care (Signed)
Problem: Alteration in mood Goal: LTG-Patient reports reduction in suicidal thoughts (Patient reports reduction in suicidal thoughts and is able to verbalize a safety plan for whenever patient is feeling suicidal)  Outcome: Not Progressing Patient continues not to varbally speak with staff.

## 2015-01-29 NOTE — Progress Notes (Addendum)
1:1 Note D: Patient resting in bed with eyes closed.  Respirations even and unlabored.  Patient appears to be in no apparent distress. A: Staff to monitor Q 15 mins for safety.  Patient remains on 1:1 for safety. R:Patient remains safe on the unit.  

## 2015-01-29 NOTE — Progress Notes (Signed)
1:1 note D: Patient resting in bed with eyes closed.  Respirations even and unlabored.  Patient appears to be in no apparent distress. A: Staff to monitor Q 15 mins for safety.  Patient remains on 1:1 for safety. R:Patient remains safe on the unit.  

## 2015-01-29 NOTE — BHH Counselor (Signed)
Clinical Social Work Note  Confirmed that pt is on [facility] wait list at this time.  Aithan Farrelly Grossman-Orr, LCSW 01/29/2015, 1:41 PM 

## 2015-01-29 NOTE — Progress Notes (Signed)
Patient 1:1 note:  D: Pt. ate less than 50% of dinner in Dayroom, then returned to bedroom.  Currently lying in bed with eyes closed. A: Continued close monitoring with 1:1 safety attendant and Q 15 minute checks.  Encouragement and support provided. R: Pt. remains safe. No further talking noted.

## 2015-01-29 NOTE — BHH Group Notes (Signed)
BHH Group Notes: (Clinical Social Work)   01/29/2015      Type of Therapy:  Group Therapy   Participation Level:  Did Not Attend despite MHT prompting   Ambrose MantleMareida Grossman-Orr, LCSW 01/29/2015, 12:24 PM

## 2015-01-29 NOTE — Progress Notes (Signed)
Pt approached this RN asking, "When do you think I can go home?"  [facility]  He states that there is no family to contact. Does not remember why he came to the hospital or what brought him in to the hospital. Pt denies AV hallucinations and currently is able to contract for safety.  Continued close monitoring for safety and stability.  Ok EdwardsSheila Xela Oregel, RN

## 2015-01-29 NOTE — Progress Notes (Signed)
D: Patient resting in bed with eyes closed.  Respirations even and unlabored.  Patient appears to be in no apparent distress. A: Staff to monitor Q 15 mins for safety.  Patient remains on 1:1 for safety. R:  Patient remains safe on the unit.  

## 2015-01-29 NOTE — Progress Notes (Signed)
Patient 1:1 note:  D: Pt sitting in Dayroom to watching tv, does not answer questions or look at this RN when asked questions.  No interaction with others in Dayroom. A: Continued close monitoring with 1:1 and Q 15 minute checks.  Continued gentle interactactions with patient to observe for any improvements. R: Pt remains safe.  He did not attend group today.

## 2015-01-29 NOTE — Progress Notes (Signed)
D:Patient in his room on first approach.  Patient refused to verbally communicate with writer on first approach.  Patient did take snack from writer and medications.  Patient is on 1:1 for safety and when sitter left from the room.  Patient did answer some questions writer had.  Patient verbally denied SI/HI and denies AVH.  Patient asked for milk to drink.  Writer shook his head not to a goal.  Patient did states, "When am I leaving."  Writer tried to inquire why patient chooses not to speak and patient stated, "I didn't say any of this."  At that time the sitter was walking back into the room.   A: Staff to monitor Q 15 mins for safety.  Encouragement and support offered.  Scheduled medications administered per orders. R: Patient remains safe on the unit.  Patient did not attend group tonight.  Patient not visible on the unit tonight.  Patient taking administered medications.

## 2015-01-29 NOTE — Progress Notes (Signed)
Patient ID: HADEN SUDER, male   DOB: 01-07-1991, 24 y.o.   MRN: [id number] [facility] MD Progress Note  01/29/2015  AUSTEN OYSTER  MRN:  [id number] Subjective: Patient remains selectively mute. He would not make eye contact or communicate for the assessment.   Objective: Pt seen and chart reviewed. Cannot accurately assess his orientation due to lack of cooperation; may be selective. Improvement compared to admission- as reported by nursing staff eating meals, drinking fluids, up from bed to go to bathroom, allowing vitals to be taken and labs to be drawn without becoming agitated or irritable. However, remains selectively mute, and  Continues to spend most time in bed, except for going to bathroom. Today eyes are open , but moves head to avoid eye contact. Does seem to be listening to what is asked. Not allowing physical examination but does not appear stiff or  rigid, and vitals remain stable, no fever.    Of note, staff /CSW have made attempts to obtain collateral information, to include calling  Contact numbers listed in chart- CSW stated number was incorrect, person who answered did not know patient.  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 Psychoses [F29]   . Vitamin B12 deficiency [E53.8] 01/22/2015  . 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 Psychosis [F29] 01/21/2015  . Cognitive disorder [F09] 01/21/2015  . Epistaxis [R04.0] 01/21/2015  . Altered mental status [R41.82]    Total Time spent with patient: 20 minutes  Past Psychiatric History: unknown - please see H&P  Past Medical History: History reviewed. No pertinent past medical history. History reviewed. No pertinent past surgical history. Family History:  Family History  Problem Relation Age of Onset  . Family history unknown: Yes   Family Psychiatric  History: unknown mental health  hx Social History: Pt is homeless, did stay at [facility] in the past.  History  Alcohol Use: Not on file     History  Drug Use Not on file    Social History   Social History  . Marital Status: Single    Spouse Name: N/A  . Number of Children: N/A  . Years of Education: N/A   Social History Main Topics  . Smoking status: Unknown If Ever Smoked  . Smokeless tobacco: None  . Alcohol Use: None  . Drug Use: None  . Sexual Activity: Not Asked   Other Topics Concern  . None   Social History Narrative   Additional Social History:   Sleep:  Improved  as observed  Appetite:  improving Current Medications: Current Facility-Administered Medications  Medication Dose Route Frequency Provider Last Rate Last Dose  . acetaminophen (TYLENOL) tablet 650 mg  650 mg Oral Q4H PRN Earney Navy, NP      . alum & mag hydroxide-simeth (MAALOX/MYLANTA) 200-200-20 MG/5ML suspension 30 mL  30 mL Oral PRN Earney Navy, NP      . benztropine (COGENTIN) tablet 1 mg  1 mg Oral BID Beau Fanny, FNP   1 mg at 01/29/15 0801   Or  . benztropine mesylate (COGENTIN) injection 1 mg  1 mg Intramuscular BID Beau Fanny, FNP      . cyanocobalamin ((VITAMIN B-12)) injection 1,000 mcg  1,000 mcg Intramuscular Q7 days Jomarie Longs, MD   1,000 mcg at 01/29/15 0803   Followed by  . [START ON 02/26/2015] cyanocobalamin ((VITAMIN B-12)) injection 1,000 mcg  1,000 mcg Intramuscular Q30 days Jomarie Longs, MD      . feeding supplement (ENSURE ENLIVE) (ENSURE ENLIVE) liquid 237 mL  237 mL Oral TID BM Saramma Eappen, MD   237 mL at 01/29/15 1400  . haloperidol (HALDOL) tablet 10 mg  10 mg Oral BID Craige Cotta, MD   10 mg at 01/29/15 0801  . LORazepam (ATIVAN) tablet 1 mg  1 mg Oral Q6H PRN Jomarie Longs, MD   1 mg at 01/25/15 1602   Or  . LORazepam (ATIVAN) injection 1 mg  1 mg Intramuscular Q6H PRN Jomarie Longs, MD      . ondansetron (ZOFRAN) tablet 4 mg  4 mg Oral Q8H PRN Earney Navy, NP       . traZODone (DESYREL) tablet 50 mg  50 mg Oral QHS Earney Navy, NP   50 mg at 01/28/15 2143    Lab Results:  Results for orders placed or performed during the hospital encounter of 01/20/15 (from the past 48 hour(s))  CK     Status: None   Collection Time: 01/27/15  6:40 PM  Result Value Ref Range   Total CK 195 49 - 397 U/L    Comment: Performed at [facility]  CBC with Differential/Platelet     Status: Abnormal   Collection Time: 01/27/15  6:40 PM  Result Value Ref Range   WBC 6.8 4.0 - 10.5 K/uL   RBC 4.29 4.22 - 5.81 MIL/uL   Hemoglobin 12.6 (L) 13.0 - 17.0 g/dL   HCT 16.1 (L) 09.6 - 04.5 %   MCV 84.4 78.0 - 100.0 fL   MCH 29.4 26.0 - 34.0 pg   MCHC 34.8 30.0 - 36.0 g/dL   RDW 40.9 81.1 - 91.4 %   Platelets 228 150 - 400 K/uL   Neutrophils Relative % 43 %   Neutro Abs 3.0 1.7 - 7.7 K/uL   Lymphocytes Relative 47 %   Lymphs Abs 3.2 0.7 - 4.0 K/uL   Monocytes Relative 7 %   Monocytes Absolute 0.5 0.1 - 1.0 K/uL   Eosinophils Relative 3 %   Eosinophils Absolute 0.2 0.0 - 0.7 K/uL   Basophils Relative 0 %   Basophils Absolute 0.0 0.0 - 0.1 K/uL    Comment: Performed at [facility]    Physical Findings: AIMS: Facial and Oral Movements Muscles of Facial Expression: None, normal Lips and Perioral Area: None, normal Jaw: None, normal Tongue: None, normal,Extremity Movements Upper (arms, wrists, hands, fingers): None, normal Lower (legs, knees, ankles, toes): None, normal, Trunk Movements Neck, shoulders, hips: None, normal, Overall Severity Severity of abnormal movements (highest score from questions above): None, normal Incapacitation due to abnormal movements: None, normal Patient's awareness of abnormal movements (rate only patient's report): No Awareness, Dental Status Current problems with teeth and/or dentures?: No Does patient usually wear dentures?: No  CIWA:    COWS:     Musculoskeletal: Strength & Muscle Tone:  within normal limits Gait & Station: normal Patient leans: N/A  Psychiatric Specialty Exam: Review of Systems  Unable to perform ROS: mental acuity  Psychiatric/Behavioral: Positive for hallucinations. The patient has insomnia.        Pt is mute, psychotic, will not respond   All other systems reviewed and are negative.  Blood pressure 108/60, pulse 101, temperature 97.8 F (36.6 C), temperature source Oral, resp. rate 20, height 5' 5.75" (1.67 m), weight 59.421 kg (131 lb), SpO2 99 %.Body mass index is 21.31 kg/(m^2).  General Appearance:  Grooming is somewhat better   Eye Contact::  Minimal, but today eyes are open rather than keeping them shut   Speech:  mute   Volume:  mute  Mood:  does not repsond  Affect:  Hard to assess in the absence of speech, but today seems less irritable  Thought Process:  Difficult to assess as selectively mute   Orientation:  Other:  mute  Thought Content:  is mute- appears to be responding to internal stimuli  Suicidal Thoughts:  does not respond- has not exhibited any self injurious behaviors   Homicidal Thoughts:  does not respond  Memory:  unable to assess  Judgement:  Impaired  Insight:  Lacking  Psychomotor Activity:  Decreased- but getting out of bed for bathroom   Concentration:  Poor  Recall:  Poor  Fund of Knowledge:Poor  Language: Poor  Akathisia:  No    AIMS (if indicated):     Assets:  Others:  access to health care  ADL's:  Impaired  Cognition: Impaired,  Moderate  Sleep:  Number of Hours: 6.75   Assessment - patient  Remains selectively mute, negativistic, but  As noted above, improved compared to admission. Preserved PO intake, vitals stable, labs unremarkable, no evidence of muscular rigidity, no fever, CK within normal. Tolerating Haldol well. Dr. Dub MikesLugo also has seen, evaluated patient and agrees with current management and increasing haldol dose. On 01/29/2015, pt continues to be selectively mute yet is improving in terms of  self-care, although mildly. No medication changes at this time.    Daily contact with patient to assess and evaluate symptoms and progress in treatment and Medication management  -Continue Haldol 10  mg IM bid  /po bid  - PATIENT IS CURRENTLY ON FORCED MEDICATIONS- PLEASE SEE DR.JOnnalagada's notes in chart. Continue  Cogentin 1 mg po bid for EPS. Will continue Ativan 1 mg po q6h prn po/IM for severe anxiety/agitation. Continue 1;1 for patient safety. Continue B12 supplementation   CSW / team continuing to try to get collateral information.  Beau FannyWithrow, John C, FNP-BC  01/29/2015, 10:30 AM I agree with assessment and plan Madie Renorving A. Dub MikesLugo, M.D.

## 2015-01-30 NOTE — Progress Notes (Signed)
Adult Psychoeducational Group Note  Date:  01/30/2015 Time:  8:25 PM  Group Topic/Focus:  Wrap-Up Group:   The focus of this group is to help patients review their daily goal of treatment and discuss progress on daily workbooks.  Participation Level:  Did Not Attend  Pt was asleep during wrap-up group.    Justin [name] 01/30/2015, 8:25 PM

## 2015-01-30 NOTE — Progress Notes (Signed)
1:1 Note D: Patient resting in bed with eyes closed.  Respirations even and unlabored.  Patient appears to be in no apparent distress. A: Staff to monitor Q 15 mins for safety.  Patient remains on 1:1 for safety. R:Patient remains safe on the unit.  

## 2015-01-30 NOTE — Progress Notes (Signed)
1:1 note:  Pt presents laying in his assigned bed, eyes open. Pt continues to not communicate with this nurse. MHT reports pt ate 100% of dinner in the day room. Pt taking PO medication as prescribed. No s/s of distress. Remains on 1:1 for safety. Will continue to monitor.

## 2015-01-30 NOTE — Progress Notes (Signed)
D: Pt is resting in his room with eyes closed. No distress noted. Respirations are even and unlabored. A: 1:1 staff remains with pt for safety.  R: Pt remains safe on the unit.   

## 2015-01-30 NOTE — Progress Notes (Signed)
Patient ID: Justin BartersRobert E [name], male   DOB: [birth year], 25 y.o.   MRN: [id number] [facility]  01/30/2015  Justin BartersRobert E [name]  MRN:  [id number] Subjective: Patient continues to present as selectively mut and did not answer my questions.   Objective: Pt seen and chart reviewed. Cannot accurately assess his orientation due to lack of cooperation; may be selective. Improvement compared to admission- as reported by nursing staff eating meals, drinking fluids, up from bed to go to bathroom, allowing vitals to be taken and labs to be drawn without becoming agitated or irritable. However, remains selectively mute, and  Continues to spend most time in bed, except for going to bathroom. Today eyes are open , but moves head to avoid eye contact.Does seem to be listening to what is asked. Not allowing physical examination but does not appear stiff or  rigid, and vitals remain stable, no fever. Of note, staff /CSW have made attempts to obtain collateral information, to include calling  Contact numbers listed in chart- CSW stated number was incorrect, person who answered did not know patient.  Today, on 01/30/2015, pt continues to present the same as above, non-verbal, but has improved dramatically in regard to leaving his room, bathing, eating, and speaking as of last night per nursing staff statements.    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 Psychoses [F29]   . Vitamin B12 deficiency [E53.8] 01/22/2015  . 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 Psychosis [F29] 01/21/2015  . Cognitive disorder [F09] 01/21/2015  . Epistaxis [R04.0] 01/21/2015  . Altered mental status [R41.82]    Total Time spent with patient: 20 minutes  Past Psychiatric History: unknown - please see H&P  Past Medical History: History reviewed. No pertinent past medical history. History reviewed. No  pertinent past surgical history. Family History:  Family History  Problem Relation Age of Onset  . Family history unknown: Yes   Family Psychiatric  History: unknown mental health hx Social History: Pt is homeless, [facility].  History  Alcohol Use: Not on file     History  Drug Use Not on file    Social History   Social History  . Marital Status: Single    Spouse Name: N/A  . Number of Children: N/A  . Years of Education: N/A   Social History Main Topics  . Smoking status: Unknown If Ever Smoked  . Smokeless tobacco: None  . Alcohol Use: None  . Drug Use: None  . Sexual Activity: Not Asked   Other Topics Concern  . None   Social History Narrative   Additional Social History:   Sleep:  Improved  as observed  Appetite:  improving Current Medications: Current Facility-Administered Medications  Medication Dose Route Frequency Provider Last Rate Last Dose  . acetaminophen (TYLENOL) tablet 650 mg  650 mg Oral Q4H PRN Earney NavyJosephine C Onuoha, NP      . alum & mag hydroxide-simeth (MAALOX/MYLANTA) 200-200-20 MG/5ML suspension 30 mL  30 mL Oral PRN Earney NavyJosephine C Onuoha, NP      . benztropine (COGENTIN) tablet 1 mg  1 mg Oral BID Beau FannyJohn C Withrow, FNP   1 mg at 01/30/15 [phone]  Or  . benztropine mesylate (COGENTIN) injection 1 mg  1 mg Intramuscular BID Beau Fanny, FNP      . cyanocobalamin ((VITAMIN B-12)) injection 1,000 mcg  1,000 mcg Intramuscular Q7 days Jomarie Longs, MD   1,000 mcg at 01/29/15 0803   Followed by  . [START ON 02/26/2015] cyanocobalamin ((VITAMIN B-12)) injection 1,000 mcg  1,000 mcg Intramuscular Q30 days Saramma Eappen, MD      . feeding supplement (ENSURE ENLIVE) (ENSURE ENLIVE) liquid 237 mL  237 mL Oral TID BM Saramma Eappen, MD   237 mL at 01/30/15 0958  . haloperidol (HALDOL) tablet 10 mg  10 mg Oral BID Craige Cotta, MD   10 mg at 01/30/15 1610  . LORazepam (ATIVAN) tablet 1 mg  1 mg Oral Q6H PRN Jomarie Longs, MD   1 mg at  01/25/15 1602   Or  . LORazepam (ATIVAN) injection 1 mg  1 mg Intramuscular Q6H PRN Saramma Eappen, MD      . ondansetron (ZOFRAN) tablet 4 mg  4 mg Oral Q8H PRN Earney Navy, NP      . traZODone (DESYREL) tablet 50 mg  50 mg Oral QHS Earney Navy, NP   50 mg at 01/29/15 2124    Lab Results:  No results found for this or any previous visit (from the past 48 hour(s)).  Physical Findings: AIMS: Facial and Oral Movements Muscles of Facial Expression: None, normal Lips and Perioral Area: None, normal Jaw: None, normal Tongue: None, normal,Extremity Movements Upper (arms, wrists, hands, fingers): None, normal Lower (legs, knees, ankles, toes): None, normal, Trunk Movements Neck, shoulders, hips: None, normal, Overall Severity Severity of abnormal movements (highest score from questions above): None, normal Incapacitation due to abnormal movements: None, normal Patient's awareness of abnormal movements (rate only patient's report): No Awareness, Dental Status Current problems with teeth and/or dentures?: No Does patient usually wear dentures?: No  CIWA:    COWS:     Musculoskeletal: Strength & Muscle Tone: within normal limits Gait & Station: normal Patient leans: N/A  Psychiatric Specialty Exam: Review of Systems  Unable to perform ROS: mental acuity  Psychiatric/Behavioral: Positive for hallucinations. The patient has insomnia.        Pt is mute, psychotic, will not respond   All other systems reviewed and are negative.   Blood pressure 113/74, pulse 84, temperature 97.8 F (36.6 C), temperature source Oral, resp. rate 18, height 5' 5.75" (1.67 m), weight 59.421 kg (131 lb), SpO2 99 %.Body mass index is 21.31 kg/(m^2).  General Appearance:  Grooming is somewhat better   Eye Contact::  Minimal, but today eyes are open rather than keeping them shut   Speech:  mute   Volume:  mute  Mood:  does not repsond  Affect:  Hard to assess in the absence of speech, but today  seems less irritable  Thought Process:  Difficult to assess as selectively mute   Orientation:  Other:  mute  Thought Content:  is mute- appears to be responding to internal stimuli  Suicidal Thoughts:  does not respond- has not exhibited any self injurious behaviors   Homicidal Thoughts:  does not respond  Memory:  unable to assess  Judgement:  Impaired  Insight:  Lacking  Psychomotor Activity:  Decreased- but getting out of bed for bathroom   Concentration:  Poor  Recall:  Poor  Fund of Knowledge:Poor  Language: Poor  Akathisia:  No    AIMS (if indicated):  Assets:  Others:  access to health care  ADL's:  Impaired  Cognition: Impaired,  Moderate  Sleep:  Number of Hours: 6.75   Assessment - patient  Remains selectively mute, negativistic, but  As noted above, improved compared to admission. Preserved PO intake, vitals stable, labs unremarkable, no evidence of muscular rigidity, no fever, CK within normal. Tolerating Haldol well. Dr. Dub Mikes also has seen, evaluated patient and agrees with current management and increasing haldol dose. On 01/30/2015, pt continues to be selectively mute yet is improving in terms of self-care, although mildly. No medication changes at this time as they do report he is improving (nursing staff have observed), in terms of self-care, bathing, interaction with others, and speaking some last night.   Daily contact with patient to assess and evaluate symptoms and progress in treatment and Medication management  -Continue Haldol 10  mg IM bid  /po bid  - PATIENT IS CURRENTLY ON FORCED MEDICATIONS- PLEASE SEE DR.JOnnalagada's notes in chart. Continue  Cogentin 1 mg po bid for EPS. Will continue Ativan 1 mg po q6h prn po/IM for severe anxiety/agitation. Continue 1;1 for patient safety. Continue B12 supplementation   CSW / team continuing to try to get collateral information.  Beau Fanny, FNP-BC  01/30/2015, 11:15 AM I agree with assessment and plan Madie Reno  A. Dub Mikes, M.D.

## 2015-01-30 NOTE — Progress Notes (Signed)
1:1 note:  Pt presents laying in his assigned bed, eyes open. Continues to not answer my questions/communicate with me. Pt ate 50% of lunch, and drank scheduled Ensure. Pt encouraged to be active in the milieu. Remains on 1:1 for safety. Assigned MHT present. Will continue to monitor.

## 2015-01-30 NOTE — [facility] Group Notes (Signed)
[facility] Group Notes: (Clinical Social Work)   01/30/2015      Type of Therapy:  Group Therapy   Participation Level:  Did Not Attend despite MHT prompting   Ambrose MantleMareida Grossman-Orr, LCSW 01/30/2015, 12:51 PM

## 2015-01-30 NOTE — Progress Notes (Signed)
1:1 note:  Pt presents laying in his assigned bed, eyes open. Continues to forward little with this nurse. Noted OOB in day room at start of shift. When asked if having thoughts of hurting himself pt reports "no" and shakes head. When asked what flavor Ensure he would like pt reports "doesn't matter"  No other communication with this nurse. Pt ate 75% of breakfast. Assigned nursing staff with pt. Remains on 1:1 for safety. Will continue to monitor.

## 2015-01-30 NOTE — Progress Notes (Signed)
D: Patient resting in bed with eyes closed.  Respirations even and unlabored.  Patient appears to be in no apparent distress. A: Staff to monitor Q 15 mins for safety.  Patient remains on 1:1 for safety. R:  Patient remains safe on the unit.  

## 2015-01-31 DIAGNOSIS — F209 Schizophrenia, unspecified: Secondary | ICD-10-CM | POA: Diagnosis present

## 2015-01-31 MED ORDER — CITALOPRAM HYDROBROMIDE 10 MG PO TABS
10.0000 mg | ORAL_TABLET | Freq: Every day | ORAL | Status: DC
  Administered 2015-02-01 – 2015-02-02 (×2): 10 mg via ORAL
  Filled 2015-01-31 (×5): qty 1

## 2015-01-31 MED ORDER — CITALOPRAM HYDROBROMIDE 10 MG PO TABS: 10.0000 mg | ORAL_TABLET | Freq: Every day | ORAL | Status: DC

## 2015-01-31 NOTE — Progress Notes (Signed)
[facility] Post 1:1 Observation Documentation  For the first (8) hours following discontinuation of 1:1 precautions, a progress note entry by nursing staff should be documented at least every 2 hours, reflecting the patient's behavior, condition, mood, and conversation.  Use the progress notes for additional entries.  Time 1:1 discontinued:  2 p.m.  Patient's Behavior:  Isolative in room most of shift during awake hours, patient did go to day room for snack  Patient's Condition:  Alert and oriented x 4  Patient's Conversation:  Patient stated yes and no for assessment questions  Wandra MannanKimberly R Justun Anaya 01/31/2015, 8:53 PM

## 2015-01-31 NOTE — Progress Notes (Signed)
[facility] Post 1:1 Observation Documentation  For the first (8) hours following discontinuation of 1:1 precautions, a progress note entry by nursing staff should be documented at least every 2 hours, reflecting the patient's behavior, condition, mood, and conversation.  Use the progress notes for additional entries.  Time 1:1 discontinued:  2pm  Patient's Behavior:  Patient is alert and oriented to person.  Patient in the dayroom watching TV.  No behavioral issues noted or reported.  Patient's Condition:  Patient is calm and quiet.  Patient's Conversation:  No interaction with staff or other patient.  Mickie Baillizabeth O Iwenekha 01/31/2015, 2 PM.

## 2015-01-31 NOTE — Progress Notes (Signed)
[facility] Post 1:1 Observation Documentation  For the first (8) hours following discontinuation of 1:1 precautions, a progress note entry by nursing staff should be documented at least every 2 hours, reflecting the patient's behavior, condition, mood, and conversation.  Use the progress notes for additional entries.  Time 1:1 discontinued:  2 pm  Patient's Behavior:  Patient is calm and quiet.  Patient is in his room sleeping.  Patient's Condition:  Patient is stable.  Patient's Conversation:  No conversation with staff or peers.  Mickie Baillizabeth O Iwenekha 01/31/2015, 4:14 PM
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
## 2015-01-31 NOTE — Clinical Social Work Note (Signed)
Adult Protective Services report made to [employer] [facility] [phone]).    Santa GeneraAnne Otniel Hoe, LCSW Lead Clinical Social Worker Phone:  [phone]

## 2015-01-31 NOTE — Progress Notes (Signed)
[facility] Post 1:1 Observation Documentation  For the first (8) hours following discontinuation of 1:1 precautions, a progress note entry by nursing staff should be documented at least every 2 hours, reflecting the patient's behavior, condition, mood, and conversation.  Use the progress notes for additional entries.  Time 1:1 discontinued:  2 pm   Patient's Behavior:  Patient in the dayroom having his meal.  No interaction with staff or peers.  Patient's Condition:  Patient is alert, calm and quiet.  Patient's Conversation:  No interaction.  Mickie Baillizabeth O Iwenekha 01/31/2015, 6:45 PM

## 2015-01-31 NOTE — Progress Notes (Signed)
D: Unable to assess patient. Patient would not not speak with Clinical research associatewriter when asking assessment questions. Patient was given scheduled ensure and patient drank. Patient would not wake up to take scheduled Trazodone at 2200.  A: Staff to monitor Q 15 mins for safety. Encouragement and support offered. Scheduled medications administered per orders. R: Patient remains safe on the unit.  Patient visible on the unit. Patient taking administered medications.

## 2015-01-31 NOTE — Progress Notes (Signed)
1:1 Note: Patient maintained on constant supervision for safety.  Patient compliant with medication regime.  No behavioral issues noted or reported.  Patient in his room in bed sleeping.
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
## 2015-01-31 NOTE — B[facility] Group Notes (Signed)
[facility]   01/31/2015 4:12 PM  Participation Quality:  Invited.  Chose to not attend.    Daryel GeraldNorth, Blandina Renaldo B
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
## 2015-01-31 NOTE — Progress Notes (Signed)
D: Pt is resting in his room with eyes closed. No distress noted. Respirations are even and unlabored. A: 1:1 staff remains with pt for safety.  R: Pt remains safe on the unit.   
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
## 2015-02-01 DIAGNOSIS — F09 Unspecified mental disorder due to known physiological condition: Secondary | ICD-10-CM | POA: Clinically undetermined

## 2015-02-01 DIAGNOSIS — Z87898 Personal history of other specified conditions: Secondary | ICD-10-CM

## 2015-02-01 NOTE — Clinical Social Work Note (Signed)
CSW observed while [facility] talked w patient about their programs/services.  Pt agreed to work w [employer] program, Investment banker, corporateATH worker will pick up patient at discharge and [facility] to [facility] - will seek shelter bed for patient.  [facility] has accepted patients case, has been assigned to Deberah PeltonJoanne Otunye [phone]) who will further evaluate patient's needs.  Patient was able to respond appropriately to questions from [facility] worker, stated [facility], has no stable address, has no medical care or any supports ("I have no family").  Initially told PATH worker his name was "no name" and would not give birth date - then responded "they say my name is Gwynneth MacleodRobert Andrus."  Says he has no connections w supports on the street, is isolated from others in the homeless community, [facility] and remains on the street nearby during the day.  Santa GeneraAnne Astor Gentle, LCSW Lead Clinical Social Worker Phone:  [phone]

## 2015-02-01 NOTE — BHH Group Notes (Signed)
BHH LCSW Group Therapy  02/01/2015 , 2:46 PM   Type of Therapy:  Group Therapy  Participation Level:  Invited.  Chose to not participate.  Summary of Progress/Problems: Today's group focused on the term Diagnosis.  Participants were asked to define the term, and then pronounce whether it is a negative, positive or neutral term.  Daryel Geraldorth, Tabby Beaston B 02/01/2015 , 2:46 PM

## 2015-02-01 NOTE — BHH Group Notes (Signed)
BHH Group Notes:  (Nursing/MHT/Case Management/Adjunct)  Date:  02/01/2015  Time:  0945 Type of Therapy:  Nurse Education  Participation Level:  Did Not Attend   Justin [name] 02/01/2015, 10:58 AM

## 2015-02-01 NOTE — Progress Notes (Signed)
DAR NOTE: Patient affect remained flat and mood is depressed.  Denies suicidal thoughts, pain, auditory and visual hallucinations on assessment.  Rates depression at 0, hopelessness at 0, and anxiety at 0.  described energy level as high and concentration as good.  Maintained on routine safety checks.  Medications given as prescribed.  Support and encouragement offered as needed.   Patient remained isolative to his room.  Patient in bed sleeping most of the shift.  Refused to attend group or milieu.
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
## 2015-02-01 NOTE — Progress Notes (Signed)
Adult Psychoeducational Group Note  Date:  02/01/2015 Time:  12:31 AM  Group Topic/Focus:  Wrap-Up Group:   The focus of this group is to help patients review their daily goal of treatment and discuss progress on daily workbooks.  Participation Level:  Did Not Attend   Additional Comments:  Did not attend. RN notified.   Burman FreestoneCraddock, Alvie Fowles L 02/01/2015, 12:31 AM

## 2015-02-01 NOTE — Progress Notes (Signed)
D: Patient alert and oriented x 4. Patient denies pain/SI/Hi/AVH.  Patient would answer questions with yes or no answers or nod his head.  A: Staff to monitor Q 15 mins for safety. Encouragement and support offered. Scheduled medications administered per orders. R: Patient remains safe on the unit. Patient attended group tonight. Patient visible on hte unit and interacting with peers. Patient taking administered medications.

## 2015-02-02 MED ORDER — HALOPERIDOL DECANOATE 100 MG/ML IM SOLN
100.0000 mg | INTRAMUSCULAR | Status: DC
  Administered 2015-02-02: 100 mg via INTRAMUSCULAR
  Filled 2015-02-02: qty 1

## 2015-02-02 MED ORDER — CYANOCOBALAMIN 1000 MCG/ML IJ SOLN: 1000.0000 ug | INTRAMUSCULAR | Status: DC

## 2015-02-02 MED ORDER — BENZTROPINE MESYLATE 1 MG/ML IJ SOLN
1.0000 mg | Freq: Once | INTRAMUSCULAR | Status: AC
  Administered 2015-02-02: 1 mg via INTRAMUSCULAR
  Filled 2015-02-02: qty 1

## 2015-02-02 MED ORDER — CYANOCOBALAMIN 1000 MCG/ML IJ SOLN
1000.0000 ug | INTRAMUSCULAR | Status: DC
  Administered 2015-02-04: 1000 ug via INTRAMUSCULAR
  Filled 2015-02-02: qty 1

## 2015-02-02 MED ORDER — CITALOPRAM HYDROBROMIDE 10 MG PO TABS
15.0000 mg | ORAL_TABLET | Freq: Every day | ORAL | Status: DC
  Administered 2015-02-03 – 2015-02-04 (×2): 15 mg via ORAL
  Filled 2015-02-02: qty 11
  Filled 2015-02-02 (×3): qty 2

## 2015-02-02 NOTE — Progress Notes (Signed)
D: Patient alert and oriented x 4. Patient denies pain/SI/HI/AVH.  Patient was a little more verbal with this Clinical research associatewriter. Patient would answer assessment questions with yes and no answers. Patient refused Trazodone. Patient is currently in bed resting with eyes closed. Will continue to monitor. A: Staff to monitor Q 15 mins for safety. Encouragement and support offered. Scheduled medications administered per orders. R: Patient remains safe on the unit. Patient attended group tonight. Patient visible on the unit and interacting with peers. Patient taking administered medications.

## 2015-02-02 NOTE — Progress Notes (Signed)
Patient ID: Justin BartersRobert E [name], male   DOB: 1990/02/29, 25 y.o.   MRN: [id number] Woman here from the hospital to complete a PASAR on patient for possible placement. [facility] asked where he would [facility] to [facility]. He was cooperative with the interview process which was fairly short in time.

## 2015-02-02 NOTE — [facility] Group Notes (Signed)
[facility]  02/02/2015 , 1:59 PM    Type of Therapy:  [employer] Presentation  Participation Level:  Active  Participation Quality:  Attentive  Affect:  Blunted  Cognitive:  Oriented  Insight:  Limited  Engagement in Therapy:  Engaged  Modes of Intervention:  Discussion, Education and Socialization  Summary of Progress/Problems:  Onalee HuaDavid from [employer] came to present his recovery story and play the guitar.  Stayed the entire time.  Engaged throughout. Sat quietly.  Daryel Geraldorth, Jeric Slagel B 02/02/2015 , 1:59 PM

## 2015-02-02 NOTE — [facility] Group Notes (Signed)
[facility] L[facility]charge Planning Group Note   02/02/2015 1:56 PM  Participation Quality:  Engaged  Mood/Affect:  Flat  Depression Rating:    Anxiety Rating:    Thoughts of Suicide:  No Will you contract for safety?   NA  Current AVH:  No  Plan for Discharge/Comments:  Stated both his sleep and appetite are good.  No other questions nor concerns.  Acknowledged that [facility], and I let him know that we had a ride lined up to take him.  Transportation Means:   Supports:  Daryel GeraldNorth, Amani Marseille B

## 2015-02-02 NOTE — Progress Notes (Signed)
Patient did not attend group, was sleeping. 
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
## 2015-02-02 NOTE — Progress Notes (Signed)
Patient ID: Justin BartersRobert E [name], male   DOB: 12-13-1990, 25 y.o.   MRN: [id number] D-Isolative to self and room when not attending a group on the unit. Offers little but will respond to questions asked in short answers. His affect is flat and when asked denies any thoughts to hurt self or others and denies psychosis. A-Support offered. Monitored for safety and medications as ordered.Gave him Haldol Dec and Cogentin IM today as ordered. Urine specimen  Requested waiting on sample. R-No complaints offered.

## 2015-02-03 NOTE — [facility] Group Notes (Signed)
[facility] Group Notes:  (Nursing/MHT/Case Management/Adjunct)  Date:  02/03/2015  Time:  10:33 AM  Type of Therapy:  Nurse Education  Participation Level:  Active  Participation Quality:  Appropriate and Attentive  Affect:  Appropriate  Cognitive:  Alert and Appropriate  Insight:  Appropriate and Good  Engagement in Group:  Engaged  Modes of Intervention:  Activity, Discussion, Education and Exploration  Summary of Progress/Problems: Topic was on leisure and lifestyle changes. Discussed the importance of choosing a healthy leisure activities. Group encouraged to surround themselves with positive and healthy group/support system when changing to a healthy lifestyle. Patient was receptive. Patient states goal is to get better.   Justin [name] 02/03/2015, 10:33 AM

## 2015-02-03 NOTE — Progress Notes (Signed)
DAR NOTE: Patient affect is flat and mood is depressed. Denies pain, auditory and visual hallucinations.  Describes energy level as normal and concentration as good.  Rates depression at 0, hopelessness at 0, and anxiety at 0.  Maintained on routine safety checks.  Medications given as prescribed.  Support and encouragement offered as needed.  Attended group but did not participate. States goal for today is "getting better."  Patient up and in the dayroom for a period of time.  No interaction with staff or peers.

## 2015-02-03 NOTE — [facility] Group Notes (Signed)
[facility] LCSW Group Therapy  02/03/2015 1:15 pm  Type of Therapy: Process Group Therapy  Participation Level:  Active  Participation Quality:  Appropriate  Affect:  Flat  Cognitive:  Oriented  Insight:  Improving  Engagement in Group:  Limited  Engagement in Therapy:  Limited  Modes of Intervention:  Activity, Clarification, Education, Problem-solving and Support  Summary of Progress/Problems: Today's group addressed the issue of overcoming obstacles.  Patients were asked to identify their biggest obstacle post d/c that stands in the way of their on-going success, and then problem solve as to how to manage this.  Justin MaduroRobert stayed the entire time.  Unable to identify any obstacles.  I reframed this as he is someone who is able to roll with the changes and does not allow things to upset him.  He liked the reframe, but was unable to identify from where he gets that strength.  Justin [name], Justin [name] 02/03/2015   4:33 PM
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
## 2015-02-03 NOTE — Progress Notes (Signed)
D: Pt was practically in bed resting with eyes close and snoring through the night. Pt denies depression, SI/HI pain, AVH and pain. Pt is flat with minimal eye contact and interaction. A: Medications offered as prescribed.  Support, encouragement, and safe environment provided.  15-minute safety checks continue. R: Pt was med compliant.  Pt did not attend group. Safety checks continue.

## 2015-02-03 NOTE — Progress Notes (Addendum)
Patient did not attend wrap-up group. Patient was sleep.  
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
## 2015-02-04 DIAGNOSIS — F068 Other specified mental disorders due to known physiological condition: Secondary | ICD-10-CM

## 2015-02-04 DIAGNOSIS — E538 Deficiency of other specified B group vitamins: Secondary | ICD-10-CM

## 2015-02-04 DIAGNOSIS — E038 Other specified hypothyroidism: Secondary | ICD-10-CM

## 2015-02-04 DIAGNOSIS — Z8709 Personal history of other diseases of the respiratory system: Secondary | ICD-10-CM

## 2015-02-04 MED ORDER — BENZTROPINE MESYLATE 1 MG PO TABS: 1.0000 mg | ORAL_TABLET | Freq: Two times a day (BID) | ORAL | Status: DC

## 2015-02-04 MED ORDER — CITALOPRAM HYDROBROMIDE 10 MG PO TABS: 15.0000 mg | ORAL_TABLET | Freq: Every day | ORAL | Status: DC

## 2015-02-04 MED ORDER — HALOPERIDOL 10 MG PO TABS: 10.0000 mg | ORAL_TABLET | Freq: Two times a day (BID) | ORAL | Status: DC

## 2015-02-04 MED ORDER — CYANOCOBALAMIN 1000 MCG/ML IJ SOLN: 1000.0000 ug | INTRAMUSCULAR | Status: DC

## 2015-02-04 MED ORDER — HALOPERIDOL DECANOATE 100 MG/ML IM SOLN: 100.0000 mg | INTRAMUSCULAR | Status: DC

## 2015-02-04 MED ORDER — TRAZODONE HCL 50 MG PO TABS: 50.0000 mg | ORAL_TABLET | Freq: Every day | ORAL | Status: DC
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
## 2015-02-04 NOTE — Progress Notes (Signed)
  [facility] Discharge [facility] :  Will you be returning to the same living situation after discharge:  No. At discharge, do you have transportation home?: Yes,  [facility] staff Do you have the ability to pay for your medications: Yes,  mental health  Release of information consent forms completed and in the chart;  Patient's signature needed at discharge.  Patient to Follow up at: Follow-up Information    Schedule an appointment as soon as possible for a visit with Flo ShanksWOLICKI, KAROL, MD.   Specialty:  Otolaryngology   Why:  As needed   Contact information:   [address] [place] [address] [phone]       [facility] with [facility].   Why:  Contact Reggie King at this number to find out when your appointment is at mental health.  He will arrange for transportation as well.   Contact information:   [address]  [place]  [[phone]      Next level of care provider [facility] Link:no  Safety Planning and Suicide Prevention discussed: Yes,  yes     Has patient been referred to the Quitline?: N/A patient is not a smoker  Patient has been referred for addiction treatment: N/A  Ida Rogueorth, Maura Braaten B 02/04/2015, 11:25 AM

## 2015-02-04 NOTE — Progress Notes (Signed)
D: Pt is very isolative; was practically in bed resting with eyes close through the night. Pt is electively mute; denies depression, SI/HI pain, AVH and pain only by shaking his head from side to side when asked about each. Pt is flat with minimal eye contact. A: Medications offered as prescribed.  Support, encouragement, and safe environment provided.  15-minute safety checks continue. R: Pt was med compliant.  Pt did not attend group. Safety checks continue.

## 2015-02-04 NOTE — [facility] Suicide Risk Assessment (Signed)
[facility] INPATIENT:  Family/Significant Other Suicide Prevention Education  Suicide Prevention Education:  Education Completed; No one has been identified by the patient as the family member/significant other with whom the patient will be residing, and identified as the person(s) who will aid the patient in the event of a mental health crisis (suicidal ideations/suicide attempt).  With written consent from the patient, the family member/significant other has been provided the following suicide prevention education, prior to the and/or following the discharge of the patient.  The suicide prevention education provided includes the following:  Suicide risk factors  Suicide prevention and interventions  National Suicide Hotline telephone number  [facility] assessment telephone number  [facility] Emergency Assistance 911  [facility] and/or Residential Mobile Crisis Unit telephone number  Request made of family/significant other to:  Remove weapons (e.g., guns, rifles, knives), all items previously/currently identified as safety concern.    Remove drugs/medications (over-the-counter, prescriptions, illicit drugs), all items previously/currently identified as a safety concern.  The family member/significant other verbalizes understanding of the suicide prevention education information provided.  The family member/significant other agrees to remove the items of safety concern listed above. The patient did not endorse SI at the time of admission, nor did the patient c/o SI during the stay here.  SPE not required.   Daryel Geraldorth, Dugan Vanhoesen B 02/04/2015, 11:24 AM

## 2015-02-04 NOTE — Progress Notes (Signed)
[facility] discharged home with prescriptions and samples. Patient was stable and appreciative at that time. All papers and prescriptions were given and valuables returned. Verbal understanding expressed. Denies SI/HI and A/VH. Patient given opportunity to express concerns and ask questions.
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
## 2015-02-04 NOTE — Clinical Social Work Note (Signed)
Patient issued [facility] - [id number] K  Santa GeneraAnne Cunningham, LCSW Lead Clinical Social Worker Phone:  [phone]

## 2015-02-10 ENCOUNTER — Encounter (HOSPITAL_COMMUNITY): Payer: Self-pay

## 2015-07-07 ENCOUNTER — Emergency Department (HOSPITAL_COMMUNITY)
Admission: EM | Admit: 2015-07-07 | Discharge: 2015-07-18 | Disposition: A | Discharge status: Other Acute Inpatient Hospital | Payer: No Typology Code available for payment source | Attending: Emergency Medicine | Admitting: Emergency Medicine

## 2015-07-07 ENCOUNTER — Encounter (HOSPITAL_COMMUNITY): Payer: Self-pay | Admitting: Emergency Medicine

## 2015-07-07 DIAGNOSIS — F23 Brief psychotic disorder: Secondary | ICD-10-CM

## 2015-07-07 DIAGNOSIS — F200 Paranoid schizophrenia: Secondary | ICD-10-CM | POA: Insufficient documentation

## 2015-07-07 MED ORDER — ZIPRASIDONE MESYLATE 20 MG IM SOLR
20.0000 mg | Freq: Once | INTRAMUSCULAR | Status: AC
  Administered 2015-07-07: 20 mg via INTRAMUSCULAR
  Filled 2015-07-07: qty 20

## 2015-07-07 MED ORDER — STERILE WATER FOR INJECTION IJ SOLN
INTRAMUSCULAR | Status: AC
  Administered 2015-07-07
  Filled 2015-07-07: qty 10

## 2015-07-07 NOTE — ED Notes (Signed)
Staff tried to get the pt's vitals but the pt began spitting and refused to let me get vitals
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
## 2015-07-07 NOTE — ED Notes (Signed)
Advised of pt's behavior. Will draw blood once pt is medicated

## 2015-07-08 DIAGNOSIS — Z9114 Patient's other noncompliance with medication regimen: Secondary | ICD-10-CM

## 2015-07-08 DIAGNOSIS — F200 Paranoid schizophrenia: Secondary | ICD-10-CM

## 2015-07-08 LAB — CBC
HCT: 46.2 % (ref 39.0–52.0)
HEMOGLOBIN: 16.9 g/dL (ref 13.0–17.0)
MCH: 29.6 pg (ref 26.0–34.0)
MCHC: 36.6 g/dL — ABNORMAL HIGH (ref 30.0–36.0)
MCV: 80.9 fL (ref 78.0–100.0)
PLATELETS: 161 10*3/uL (ref 150–400)
RBC: 5.71 MIL/uL (ref 4.22–5.81)
RDW: 11.9 % (ref 11.5–15.5)
WBC: 5.9 10*3/uL (ref 4.0–10.5)

## 2015-07-08 LAB — COMPREHENSIVE METABOLIC PANEL
ALT: 10 U/L — AB (ref 17–63)
AST: 17 U/L (ref 15–41)
Albumin: 4.6 g/dL (ref 3.5–5.0)
Alkaline Phosphatase: 52 U/L (ref 38–126)
Anion gap: 11 (ref 5–15)
BUN: 12 mg/dL (ref 6–20)
CHLORIDE: 105 mmol/L (ref 101–111)
CO2: 23 mmol/L (ref 22–32)
Calcium: 9.2 mg/dL (ref 8.9–10.3)
Creatinine, Ser: 1 mg/dL (ref 0.61–1.24)
Glucose, Bld: 124 mg/dL — ABNORMAL HIGH (ref 65–99)
POTASSIUM: 3.3 mmol/L — AB (ref 3.5–5.1)
SODIUM: 139 mmol/L (ref 135–145)
Total Bilirubin: 1 mg/dL (ref 0.3–1.2)
Total Protein: 7.3 g/dL (ref 6.5–8.1)

## 2015-07-08 LAB — ACETAMINOPHEN LEVEL

## 2015-07-08 LAB — SALICYLATE LEVEL

## 2015-07-08 LAB — ETHANOL

## 2015-07-08 MED ORDER — TRAZODONE HCL 100 MG PO TABS
100.0000 mg | ORAL_TABLET | Freq: Every day | ORAL | Status: DC
  Administered 2015-07-16 – 2015-07-17 (×2): 100 mg via ORAL
  Filled 2015-07-08 (×2): qty 1

## 2015-07-08 MED ORDER — HALOPERIDOL 5 MG PO TABS
10.0000 mg | ORAL_TABLET | Freq: Two times a day (BID) | ORAL | Status: DC
  Administered 2015-07-08: 10 mg via ORAL
  Filled 2015-07-08 (×3): qty 2

## 2015-07-08 MED ORDER — CITALOPRAM HYDROBROMIDE 10 MG PO TABS
15.0000 mg | ORAL_TABLET | Freq: Every day | ORAL | Status: DC
  Filled 2015-07-08 (×4): qty 2

## 2015-07-08 MED ORDER — BENZTROPINE MESYLATE 1 MG PO TABS
1.0000 mg | ORAL_TABLET | Freq: Two times a day (BID) | ORAL | Status: DC
  Administered 2015-07-08: 1 mg via ORAL
  Filled 2015-07-08 (×3): qty 1

## 2015-07-08 NOTE — ED Provider Notes (Signed)
CSN: [id number]     Arrival date & time 07/07/15  2250 History   First MD Initiated Contact with Patient 07/07/15 2311     Chief Complaint  Patient presents with  . IVC   . Medical Clearance   LEVEL 5 CAVEAT - PSYCHIATRIC DISORDER  (Consider location/radiation/quality/duration/timing/severity/associated sxs/prior Treatment) HPI  25 year old male brought in by police after family filled out IVC paperwork. Paperwork states the patient has a history of schizophrenia and has been off of his meds. He has been threatening to kill family members and himself. He's been talking to the devil. History is very limited because the patient does not talk to me in any way. He was spitting and swinging at staff when they tried to get his vital signs of blood. No further information is available at this time.  Past Medical History  Diagnosis Date  . No significant past medical history   . Homelessness   . Abnormal behavior    History reviewed. No pertinent past surgical history. Family History  Problem Relation Age of Onset  . Family history unknown: Yes   Social History  Substance Use Topics  . Smoking status: Unknown If Ever Smoked  . Smokeless tobacco: None  . Alcohol Use: No    Review of Systems  Unable to perform ROS: Psychiatric disorder      Allergies  Review of patient's allergies indicates no known allergies.  Home Medications   Prior to Admission medications   Medication Sig Start Date End Date Taking? Authorizing Provider  benztropine (COGENTIN) 1 MG tablet Take 1 tablet (1 mg total) by mouth 2 (two) times daily. Patient not taking: Reported on 07/07/2015 02/04/15   Beau Fanny, FNP  citalopram (CELEXA) 10 MG tablet Take 1.5 tablets (15 mg total) by mouth daily. Patient not taking: Reported on 07/07/2015 02/04/15   Beau Fanny, FNP  cyanocobalamin (,VITAMIN B-12,) 1000 MCG/ML injection Inject 1 mL (1,000 mcg total) into the muscle every 7 (seven) days. On 01/13, 01/20, 01/27,  and then 30 days later. Patient not taking: Reported on 07/07/2015 02/04/15   Beau Fanny, FNP  haloperidol (HALDOL) 10 MG tablet Take 1 tablet (10 mg total) by mouth 2 (two) times daily. Patient not taking: Reported on 07/07/2015 02/04/15   Beau Fanny, FNP  haloperidol decanoate (HALDOL DECANOATE) 100 MG/ML injection Inject 1 mL (100 mg total) into the muscle every 28 (twenty-eight) days. Patient not taking: Reported on 07/07/2015 03/04/15   Beau Fanny, FNP  ibuprofen (ADVIL,MOTRIN) 600 MG tablet Take 1 tablet (600 mg total) by mouth every 6 (six) hours as needed. Patient not taking: Reported on 03/23/2014 03/18/14   Junius Finner, PA-C  traZODone (DESYREL) 50 MG tablet Take 1 tablet (50 mg total) by mouth at bedtime. Patient not taking: Reported on 07/07/2015 02/04/15   Beau Fanny, FNP   BP 114/67 mmHg  Pulse 81  Temp(Src) 97.7 F (36.5 C) (Oral)  Resp 14  SpO2 95% Physical Exam  Constitutional: He appears well-developed and well-nourished.  Does not speak or acknowledge me when I talk to him. Occasionally mumbles and seems to be responding to external stimuli  HENT:  Head: Normocephalic and atraumatic.  Right Ear: External ear normal.  Left Ear: External ear normal.  Nose: Nose normal.  Eyes: Right eye exhibits no discharge. Left eye exhibits no discharge.  Neck: Neck supple.  Pulmonary/Chest: Effort normal.  Neurological: He is alert.  Skin: Skin is warm and dry.  Nursing note  and vitals reviewed.   ED Course  Procedures (including critical care time) Labs Review Labs Reviewed  COMPREHENSIVE METABOLIC PANEL  ETHANOL  SALICYLATE LEVEL  ACETAMINOPHEN LEVEL  CBC  URINE RAPID DRUG SCREEN, HOSP PERFORMED    Imaging Review No results found. I have personally reviewed and evaluated these images and lab results as part of my medical decision-making.   EKG Interpretation   Date/Time:  Friday July 08 2015 00:19:58 EDT Ventricular Rate:  85 PR Interval:  176 QRS Duration:  92 QT Interval:  370 QTC Calculation: 440 R Axis:   82 Text Interpretation:  Sinus rhythm Borderline T abnormalities, inferior  leads changes noted since 2013 Confirmed by Omaira Mellen MD, Rosana Farnell [id number]) on  07/08/2015 12:27:58 AM      MDM   Final diagnoses:  Acute psychosis    Patient given geodon and now more cooperative. Is allowing staff to get labs, change into paper scrubs and get EKG. I believe this is all psych related. Will start back on meds, get clearance labs, and consult psych. Care to oncoming provider.     Pricilla LovelessScott Arwyn Besaw, MD 07/08/15 [phone]

## 2015-07-08 NOTE — ED Notes (Signed)
Pt refusing to answer questions asked by this nurse
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
## 2015-07-08 NOTE — ED Notes (Signed)
Pt does not respond with talked to; He stares.

## 2015-07-08 NOTE — ED Notes (Signed)
When asked if I can ger his vitals he just kept staring off towards the wall, but would not say anything to me or respond in any way

## 2015-07-08 NOTE — ED Notes (Signed)
Bed: WA18 Expected date:  Expected time:  Means of arrival:  Comments: Triage 6 

## 2015-07-08 NOTE — ED Notes (Signed)
Refused po medications.

## 2015-07-08 NOTE — BH Assessment (Signed)
[name]  Per Thedore MinsMojeed Akintayo, MD, this pt requires psychiatric hospitalization at this time.  The following facilities have been contacted to seek placement for this pt, with results as noted:  Beds available, information sent, decision pending:  [facility]   [facility]   Doylene Canninghomas Armeda [place], [place] Triage Specialist [phone]

## 2015-07-08 NOTE — ED Notes (Signed)
Pt sleeping at present, no distress noted, calm at present.  Monitoring for safety, Q 15 min checks in effect,

## 2015-07-08 NOTE — ED Notes (Signed)
Pt is sitting in his room, eating breakfast. Eye contact is poor as he keeps his head down. He would not respond to questions, but he did indicate that he does not want his mother to visit at this time.
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
## 2015-07-09 MED ORDER — HALOPERIDOL 5 MG PO TABS
10.0000 mg | ORAL_TABLET | Freq: Two times a day (BID) | ORAL | Status: DC
  Administered 2015-07-16 – 2015-07-18 (×5): 10 mg via ORAL
  Filled 2015-07-09 (×8): qty 2

## 2015-07-09 MED ORDER — HALOPERIDOL DECANOATE 100 MG/ML IM SOLN
100.0000 mg | INTRAMUSCULAR | Status: DC
  Administered 2015-07-09: 100 mg via INTRAMUSCULAR
  Filled 2015-07-09: qty 1

## 2015-07-09 MED ORDER — BENZTROPINE MESYLATE 1 MG/ML IJ SOLN
1.0000 mg | Freq: Two times a day (BID) | INTRAMUSCULAR | Status: DC
  Administered 2015-07-09 – 2015-07-12 (×7): 1 mg via INTRAMUSCULAR
  Administered 2015-07-13: 2 mg via INTRAMUSCULAR
  Administered 2015-07-13 – 2015-07-15 (×5): 1 mg via INTRAMUSCULAR
  Filled 2015-07-09 (×14): qty 2

## 2015-07-09 MED ORDER — HALOPERIDOL DECANOATE 100 MG/ML IM SOLN
100.0000 mg | INTRAMUSCULAR | Status: DC
  Filled 2015-07-09: qty 1

## 2015-07-09 MED ORDER — HALOPERIDOL LACTATE 5 MG/ML IJ SOLN
5.0000 mg | Freq: Two times a day (BID) | INTRAMUSCULAR | Status: DC
  Administered 2015-07-09 – 2015-07-15 (×13): 5 mg via INTRAMUSCULAR
  Filled 2015-07-09 (×13): qty 1

## 2015-07-09 MED ORDER — LORAZEPAM 2 MG/ML IJ SOLN
1.0000 mg | Freq: Once | INTRAMUSCULAR | Status: AC
  Administered 2015-07-09: 1 mg via INTRAMUSCULAR
  Filled 2015-07-09: qty 1

## 2015-07-09 MED ORDER — BENZTROPINE MESYLATE 1 MG PO TABS
1.0000 mg | ORAL_TABLET | Freq: Two times a day (BID) | ORAL | Status: DC
  Administered 2015-07-16 – 2015-07-18 (×5): 1 mg via ORAL
  Filled 2015-07-09 (×7): qty 1

## 2015-07-09 NOTE — ED Notes (Signed)
This nurse went to pt room to inform him his Haldol Dec shot was due today, pt looked at this nurse, did not communicate with this nurse and then spit towards this nurse.

## 2015-07-09 NOTE — ED Provider Notes (Signed)
10:59 AM I spoke with Dr Lolly MustacheArfeen this morning.  He asked me to assess Mr Justin [name].  Pt has schizophrenia and has not been taking his medications.  He is refusing his medications this am.  Pt is withdrawn.  He is lying in bed.  When I speak to him he turns to look at me briefly but does not answer any of my questions and turns his head back to stare at the ceiling.  He was spitting at the nurse earlier.  I agree that the patient would benefit from IM injection despite pt refusal.  Linwood DibblesJon Gerardine Peltz, MD 07/09/15 1103
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
## 2015-07-09 NOTE — ED Notes (Signed)
Pt sleeping at present, not interactive with staff.  Awake, alert & responsive, no distress noted, calm at present.  Monitoring for safety, Q 15 min checks in effect.

## 2015-07-09 NOTE — ED Notes (Signed)
[facility] called to say pt declined due to history of aggression.

## 2015-07-09 NOTE — Progress Notes (Signed)
Disposition CSW completed additional patient referrals to the following inpatient psych facilities:  [facility]  CSW will continue to follow patient for placement needs.  Seward SpeckLeo Ade Stmarie Cobleskill Regional HospitalCSW,LCAS Behavioral Health Disposition CSW [phone]

## 2015-07-09 NOTE — [facility] Counselor (Signed)
[facility]   Davina PokeJoVea Trinisha Paget, LCSW Therapeutic Triage Specialist  Health 07/09/2015 8:55 PM

## 2015-07-09 NOTE — ED Notes (Signed)
This nurse, Wille CelesteJanie, RN, security and [employer] to pt room to give medication per MD order. This nurse and Wille CelesteJanie, RN providing verbal encouragement to take medication, pt not verbally communicating. Motioning hand for us to leave room. Pt spit towards this nurse. [employer] and security hands on for this nurse to administer medication. Will continue to monitor.

## 2015-07-09 NOTE — ED Notes (Addendum)
When this nurse went to pt room to offer pt his morning medication regimen, pt did not verbally communicate with this nurse, pt kicked towards me.

## 2015-07-09 NOTE — ED Notes (Signed)
Pt refused vitals. When asked, pt spit at this Clinical research associatewriter.

## 2015-07-09 NOTE — ED Notes (Signed)
Pt refused vitals; responded to this writer by spitting at her. RN notified.

## 2015-07-10 ENCOUNTER — Encounter (HOSPITAL_COMMUNITY): Payer: Self-pay | Admitting: Registered Nurse

## 2015-07-10 DIAGNOSIS — F23 Brief psychotic disorder: Secondary | ICD-10-CM | POA: Insufficient documentation

## 2015-07-10 MED ORDER — LORAZEPAM 1 MG PO TABS
1.0000 mg | ORAL_TABLET | Freq: Two times a day (BID) | ORAL | Status: DC
  Administered 2015-07-16 – 2015-07-18 (×5): 1 mg via ORAL
  Filled 2015-07-10 (×6): qty 1

## 2015-07-10 MED ORDER — CITALOPRAM HYDROBROMIDE 10 MG PO TABS
20.0000 mg | ORAL_TABLET | Freq: Every day | ORAL | Status: DC
  Administered 2015-07-16 – 2015-07-18 (×3): 20 mg via ORAL
  Filled 2015-07-10 (×4): qty 2

## 2015-07-10 MED ORDER — LORAZEPAM 2 MG/ML IJ SOLN
1.0000 mg | Freq: Two times a day (BID) | INTRAMUSCULAR | Status: DC
  Administered 2015-07-10 – 2015-07-12 (×6): 1 mg via INTRAMUSCULAR
  Administered 2015-07-13: 2 mg via INTRAMUSCULAR
  Administered 2015-07-13 – 2015-07-15 (×5): 1 mg via INTRAMUSCULAR
  Filled 2015-07-10 (×12): qty 1

## 2015-07-10 NOTE — ED Notes (Signed)
Patient continues to refuse vital signs.

## 2015-07-10 NOTE — ED Notes (Signed)
Patient refuses PO medications and continues to spit at staff when IM medication are administered. Encouragement and support provided and safety maintain and Q 15 min safety checks remain in place.

## 2015-07-10 NOTE — ED Notes (Signed)
Pt continues to not communicate with this nurse. Special checks q 15 mins in place for safety. Video monitoring in place. Will continue to monitor.

## 2015-07-10 NOTE — ED Notes (Addendum)
This nurse, Wille CelesteJanie, RN, security, [employer] at bedside . Pt spit towards nursing staff. [employer] and security hands on for this nurse to administer force med order.

## 2015-07-10 NOTE — ED Notes (Signed)
Unable to assess patient at this time because non verbal with this Clinical research associatewriter. Encouragement and support provided and safety maintain. Q 15 min safety checks remain in place.

## 2015-07-10 NOTE — ED Notes (Signed)
When this nurse asked pt to obtain vs, pt spit towards her.
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
## 2015-07-11 NOTE — ED Notes (Signed)
Pt refused vitals at 12:00 & nurse was notified.

## 2015-07-11 NOTE — ED Notes (Signed)
Patient came to desk and asked for a snack. Encouragement and support provided and safety maintain. Q 15 min safety checks remain in place.

## 2015-07-11 NOTE — Progress Notes (Signed)
Pt denied for admission a [facility] due to acuity.    07/11/2015 Cheryl FlashNicole Haya Hemler, MS, NCC, LPCA Therapeutic Triage Specialist

## 2015-07-11 NOTE — BH Assessment (Signed)
[name]  Unable to assess patient at this time because non verbal with this Clinical research associatewriter.

## 2015-07-11 NOTE — ED Notes (Signed)
Pt refusing PO medication. This nurse, Vanessa KickJan, RN, [employer], security at bedside. This nurse and Jan, RN providing verbal encouragement for pt. Pt spit towards nursing staff. [employer] and security hands on to allow for medication administration.

## 2015-07-11 NOTE — ED Notes (Signed)
Patient is patient is non verbal with this Clinical research associatewriter at this time. Encouragement and support provided and safety maintain. Q 15 min safety checks remain in place.

## 2015-07-11 NOTE — Progress Notes (Signed)
CM spoke with pt who [place]cp.  CM discussed and provided written information to assist pt with determining choice for uninsured accepting pcps, discussed the importance of pcp vs EDP services for f/u care, www.needymeds.org, www.goodrx.com, discounted pharmacies and other [place] resources such as [facility] , [facility], affordable care act, financial assistance, uninsured dental services, [place] med assist, DSS and  health department  Reviewed resources for [place] uninsured accepting pcps like [facility], [facility] [address], [facility], [facility], local urgent care centers, [facility], [facility], general medical clinics, [facility] services of the [facility], [facility] urgent [facility] plus others, medication resources, [facility] out patient [facility]ies and housing Pt voiced understanding and appreciation of resources provided   Provided [facility] contact information Pt homeless Encouraged IRC/Urban [employer]stries

## 2015-07-11 NOTE — ED Notes (Signed)
Pt refused vital at 18:00 & nurse was notified.

## 2015-07-11 NOTE — ED Notes (Signed)
Pt came up to nurses station and asked this nurse for 11am snack. This nurse gave pt snack, "Thank you" Pt went to dayroom.

## 2015-07-11 NOTE — BH Assessment (Signed)
[name]  Per Thedore MinsMojeed Akintayo, MD, this patient continues to require psychiatric hospitalization at this time.  The following facilities have been contacted to seek placement for this pt, with results as noted:  Beds available, information sent, decision pending:  [facility]   At capacity:  [facility]   Doylene Canninghomas Larin Depaoli, [place] Triage Specialist [phone]

## 2015-07-12 NOTE — BH Assessment (Signed)
[name]  Per Thedore MinsMojeed Akintayo, MD, this pt continues to require psychiatric hospitalization.  The following facilities have been contacted to seek placement for this pt, with results as noted:  Beds available, information sent, decision pending:  [facility]   Declined:  [facility] (reason unspecified)   At capacity:  [facility]   Doylene Canninghomas Brocha Gilliam, [place] Triage Specialist [phone]

## 2015-07-12 NOTE — ED Notes (Signed)
Patient has been in his room most of the day.  He refused all of his PO medicines and required IM injections.  He would not comply with the injections and security needed to assist him in rolling over.  He came to the window once and asked for a snack.  He is very soft spoken when he does speak, but we were able to determine what he wanted.  He continues to make "puffing noises" in response to our questions.  He has been asked to take a shower on several occasions and has not yet done that.  His body odor is quite strong at this point.
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
## 2015-07-12 NOTE — ED Notes (Signed)
Patient noted in room. No complaints, stable, in no acute distress. Q15 minute rounds and monitoring via security cameras continue for safety. Pt not responding to verbal questions at this time.

## 2015-07-12 NOTE — Progress Notes (Signed)
Pt asleep when ED CM went to inquire about pcp services  Cm left uninsured resources to include uninsured resources for pt at his bedside  CM provided written information to assist pt with determining choice for uninsured accepting pcps, discussed the importance of pcp vs EDP services for f/u care, www.needymeds.org, www.goodrx.com, discounted pharmacies and other [place] resources such as [facility] , [facility], affordable care act, financial assistance, uninsured dental services, [place] med assist, DSS and  health department  Reviewed resources for [place] uninsured accepting pcps like [facility], [facility] medicine [facility] [address], [facility], [facility], local urgent care centers, [facility], [facility], general medical clinics, [facility] services of the [facility], [facility] urgent care plus others, medication resources, [facility] out patient pharmacies and housing Provided [employer] contact information

## 2015-07-12 NOTE — ED Notes (Signed)
Patient refused vital signs. Nurse notified.

## 2015-07-12 NOTE — ED Notes (Signed)
Patient refused vitals.  Nurse notified.

## 2015-07-12 NOTE — ED Notes (Signed)
Patient continues to spit at staff when approached for medications and vial signs.

## 2015-07-13 NOTE — ED Notes (Signed)
Pt refused vital signs.

## 2015-07-13 NOTE — Progress Notes (Signed)
D Pt. Is in his bed lying quietly, refusing to talk to writer or answer any questions.  Pt does nod with a no when ask about pain.   A Writer offered support and encouragement,  Had to force meds due to an order.  R Pt. Attempted to spit at RN when administering his medications.  Pt. Remains safe on the unit and is presently resting quietly.

## 2015-07-13 NOTE — BH Assessment (Signed)
[name]  Per Thedore MinsMojeed Akintayo, MD, this pt continues to require psychiatric hospitalization.  [facility]  At 16:00 this writer spoke to BarryMary at [facility] to obtain [facility] for referral.  She provices authorization [id number] from 07/13/2015 - 07/19/2015.  Please note that authorization does not mean that pt has been accepted to the facility.  At 16:04 I spoke to [facility] at [facility], who took demographic information verbally.  As of this writing referral information is in the process of being faxed [facility].  Final decision is pending.  Doylene Canninghomas Margery Szostak, [place] Triage Specialist [phone]

## 2015-07-13 NOTE — BH Assessment (Signed)
Writer reassessed pt today. Writer woke pt up. Pt will not speak. He looks at Clinical research associatewriter, but he will not respond verbally or nonverbally. TTS will continue to seek inpatient placement for pt.  Evette Cristalaroline Paige Nemiah Bubar, [place] Therapeutic Triage Specialist

## 2015-07-13 NOTE — ED Notes (Signed)
Pt does not communicate with this nurse on approach. Special checks q 15 mins in place for safety. Video monitoring in place.

## 2015-07-13 NOTE — ED Notes (Signed)
Pt refusing PO medication. This nurse, Diane RN, [employer], and security at bedside. [employer] and security hands on for medication administration. Special checks q 15 mins in place for safety. Video monitoring in place.

## 2015-07-14 NOTE — ED Notes (Signed)
Up to the bathroom 

## 2015-07-14 NOTE — ED Notes (Signed)
Pt refusing to take medicine po; injections given  In IM form with the assistance of security personnel. Pt attempting to spit on staff during med administration. No s/s of distress; pt tolerated well.

## 2015-07-14 NOTE — ED Notes (Signed)
Patient refused vitals. Nurse Notified.

## 2015-07-14 NOTE — BH Assessment (Signed)
[name]  At 17:26 Vonna KotykJay at [facility] is now on their wait list.  Doylene Canninghomas Annamary Buschman, MA Triage Specialist [phone]

## 2015-07-14 NOTE — ED Notes (Addendum)
Pt laying quietly in the bed.  Pt declined to talk, no eye contact.  When asked if he would allow his BP to be talk or take his medications by mouth pt spat at this writer.  Pt declined to answer any questions verbally or w/ head movement.

## 2015-07-14 NOTE — ED Notes (Signed)
Sitting in dayroom watching tv 

## 2015-07-14 NOTE — Consult Note (Signed)
[facility] Face-to-Face Follow Up Note   Reason for Consult:   Referring Physician:  ED Provider Patient Identification: Justin BartersRobert E Splinter MRN:  [id number] Principal Diagnosis: Paranoid schizophrenia [facility](HCC) Diagnosis:   Patient Active Problem List   Diagnosis Date Noted  . Paranoid schizophrenia (HCC) [F20.0] 07/08/2015    Priority: High  . Acute psychosis [F29]   . 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 Hx of epistaxis [Z87.09] 02/01/2015  . Schizophrenia (HCC) [F20.9] 01/31/2015  . Vitamin B12 deficiency [E53.8] 01/22/2015  . 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 No significant past medical history [IMO0001]     HPI:  Per chart record, Justin BartersRobert E Radoncic is an 25 y.o. male who presents unaccompanied to [facility] ED after being petitioned for involuntary commitment by his mother, [name] [phone]. Affidavit and Petition states: "Respondent is bipolar and schizophrenic and is prescribed medication for those conditions. Patient remains very paranoid, guarded, refused to cooperate.  He was offered medication but he start spitting .  Patient has previous psychiatric history and apparently noncompliant with medication.  His been decompensating.  As per chart, the family reported that he wants to kill himself and he wants to kill his brother. He recently tied himself to a pole with a wire.  He is also self medicating with alcohol and pain pills. Respondent has violent tendencies [place]. Danger to self and others."  Pt has a documented history of schizophrenia.  [facility].  Today, patient continues to be nonverbal and responding to internal stimuli.  He does appear to be eating.  [facility]. Total Time spent with patient: 30 minutes  Subjective:   Justin BartersRobert E Emberson is a 25 y.o. male who needs inpatient psychiatric treatment   Past Psychiatric  History: schizophrenia  Risk to Self: Suicidal Ideation: Yes-Currently Present (Pt refuses to answer questions.) Suicidal Intent: Yes-Currently Present (Pt refuses to answer questions.) Is patient at risk for suicide?: Yes (Pt refuses to answer questions.) Suicidal Plan?: No (Pt refuses to answer questions.) Access to Means: Yes (Access to gun) Specify Access to Suicidal Means: Pt reported to have access to a gun What has been your use of drugs/alcohol within the last 12 months?: Pt reported to be abusing alcohol and pain pills (Pt refuses to answer questions.) How many times?: 0 Other Self Harm Risks: Pt responding to internal stimuli Triggers for Past Attempts: None known Intentional Self Injurious Behavior: None Risk to Others: Homicidal Ideation: Yes-Currently Present (Per family, Pt has made homicidal threats.) Thoughts of Harm to Others: Yes-Currently Present (Pt refuses to answer questions.) Comment - Thoughts of Harm to Others: Family reports Pt has made threats to them Current Homicidal Intent: No (Pt refuses to answer questions.) Current Homicidal Plan: Yes-Currently Present Describe Current Homicidal Plan: Pt reported to have access to a gun Access to Homicidal Means: Yes Describe Access to Homicidal Means: Pt reported to have access to a gun Identified Victim: Mother and brother History of harm to others?: Yes Assessment of Violence: On admission Violent Behavior Description: [facility] Does patient have access to weapons?: Yes (Comment) Criminal Charges Pending?: Yes Describe Pending Criminal Charges: Assault Does patient have a court date: Yes Court Date:  (Unknown) Prior Inpatient Therapy: Prior Inpatient Therapy: Yes Prior Therapy Dates: 01/2015 [facility] Reason for Treatment: Schizophrenia Prior Outpatient  Therapy: Prior Outpatient Therapy: Yes Prior Therapy Dates: Unknown Prior Therapy Facilty/Provider(s):  [facility] Reason for Treatment: Schizophrenia Does patient have an ACCT team?: No Does patient have Intensive In-House Services?  : No Does patient have [facility] services? : Unknown Does patient have P4CC services?: No  Past Medical History:  Past Medical History  Diagnosis Date  . No significant past medical history   . Homelessness   . Abnormal behavior    History reviewed. No pertinent past surgical history. Family History:  Family History  Problem Relation Age of Onset  . Family history unknown: Yes   Family Psychiatric  History: see HPI Social History:  History  Alcohol Use No     History  Drug Use Not on file    Social History   Social History  . Marital Status: Single    Spouse Name: N/A  . Number of Children: N/A  . Years of Education: N/A   Social History Main Topics  . Smoking status: Unknown If Ever Smoked  . Smokeless tobacco: None  . Alcohol Use: No  . Drug Use: None  . Sexual Activity: Not Asked   Other Topics Concern  . None   Social History Narrative   ** Merged History Encounter **       Additional Social History:    Allergies:  No Known Allergies  Labs:  No results found for this or any previous visit (from the past 48 hour(s)).  Current Facility-Administered Medications  Medication Dose Route Frequency Provider Last Rate Last Dose  . benztropine (COGENTIN) tablet 1 mg  1 mg Oral BID Cleotis Nipper, MD       Or  . benztropine mesylate (COGENTIN) injection 1 mg  1 mg Intramuscular BID Cleotis Nipper, MD   1 mg at 07/14/15 1107  . citalopram (CELEXA) tablet 20 mg  20 mg Oral Daily Abanoub Hanken, MD   20 mg at 07/11/15 1006  . haloperidol (HALDOL) tablet 10 mg  10 mg Oral BID Cleotis Nipper, MD       Or  . haloperidol lactate (HALDOL) injection 5 mg  5 mg Intramuscular BID Cleotis Nipper, MD   5 mg at 07/14/15 1106  . haloperidol decanoate (HALDOL DECANOATE) 100 MG/ML injection 100 mg  100 mg Intramuscular Q30 days Cleotis Nipper, MD   100 mg  at 07/09/15 1203  . LORazepam (ATIVAN) tablet 1 mg  1 mg Oral BID Thedore Mins, MD       Or  . LORazepam (ATIVAN) injection 1 mg  1 mg Intramuscular BID Thedore Mins, MD   1 mg at 07/14/15 1106  . traZODone (DESYREL) tablet 100 mg  100 mg Oral QHS Thedore Mins, MD   100 mg at 07/08/15 2115   Current Outpatient Prescriptions  Medication Sig Dispense Refill  . benztropine (COGENTIN) 1 MG tablet Take 1 tablet (1 mg total) by mouth 2 (two) times daily. (Patient not taking: Reported on 07/07/2015) 60 tablet 0  . citalopram (CELEXA) 10 MG tablet Take 1.5 tablets (15 mg total) by mouth daily. (Patient not taking: Reported on 07/07/2015) 45 tablet 0  . cyanocobalamin (,VITAMIN B-12,) 1000 MCG/ML injection Inject 1 mL (1,000 mcg total) into the muscle every 7 (seven) days. On 01/13, 01/20, 01/27, and then 30 days later. (Patient not taking: Reported on 07/07/2015) 10 mL 0  . haloperidol (HALDOL) 10 MG tablet Take 1 tablet (10 mg total) by mouth 2 (two) times daily. (Patient not taking: Reported on  07/07/2015) 60 tablet 0  . haloperidol decanoate (HALDOL DECANOATE) 100 MG/ML injection Inject 1 mL (100 mg total) into the muscle every 28 (twenty-eight) days. (Patient not taking: Reported on 07/07/2015) 1 mL 0  . ibuprofen (ADVIL,MOTRIN) 600 MG tablet Take 1 tablet (600 mg total) by mouth every 6 (six) hours as needed. (Patient not taking: Reported on 03/23/2014) 30 tablet 0  . traZODone (DESYREL) 50 MG tablet Take 1 tablet (50 mg total) by mouth at bedtime. (Patient not taking: Reported on 07/07/2015) 30 tablet 0    Musculoskeletal: Strength & Muscle Tone: within normal limits Gait & Station: normal Patient leans: N/A  Psychiatric Specialty Exam: Physical Exam  Vitals reviewed. Constitutional: He is oriented to person, place, and time. He appears well-developed and well-nourished.  HENT:  Head: Normocephalic.  Neck: Normal range of motion.  Respiratory: Effort normal.  Musculoskeletal: Normal range of  motion.  Neurological: He is alert and oriented to person, place, and time.  Skin: Skin is warm and dry.  Psychiatric: He is actively hallucinating. Thought content is paranoid. Cognition and memory are impaired. He expresses inappropriate judgment. He exhibits a depressed mood. He is noncommunicative.    Review of Systems  Constitutional: Negative.   HENT: Negative.   Eyes: Negative.   Respiratory: Negative.   Cardiovascular: Negative.   Gastrointestinal: Negative.   Genitourinary: Negative.   Musculoskeletal: Negative.   Skin: Negative.   Neurological: Negative.   Endo/Heme/Allergies: Negative.   Psychiatric/Behavioral: Positive for depression and hallucinations.    Blood pressure 95/58, pulse 86, temperature 98.3 F (36.8 C), temperature source Oral, resp. rate 16, SpO2 99 %.There is no weight on file to calculate BMI.  General Appearance: dishelved  Eye Contact:  None  Speech:  Blocked  Volume:  non verbal  Mood: Depressed  Affect:  Restricted  Thought Process:  NA  Orientation:  Other:  non verbal  Thought Content:  NA  Suicidal Thoughts:  non verbal  Homicidal Thoughts:  non verbal  Memory:  non verbal  Judgement:    non verbal  Insight:    poor  Psychomotor Activity:   retardation  Concentration:    non verbal  Recall:    non verbal  Fund of Knowledge:    non verbal  Language:    non verbal  Akathisia:    non verbal  Handed:    non verbal  AIMS (if indicated):     Assets:  Social Support  ADL's:    non verbal  Cognition:    non verbal  Sleep:  poor   Treatment Plan Summary:  Diagnosis:  Paranoid schizophrenia -Crisis stabilization -Medication management:  Continue Cogentin 1 mg BID for EPS, Ativan 1 mg BID for catatonia, Trazodone 100 mg at bedtime for sleep, Celexa 20 mg daily for depression, Haldol 10 mg BID for psychosis, injectable haldol deconate monthly -Individual counseling  Disposition: Recommend psychiatric Inpatient admission when medically  cleared.  Nanine Means, NP  07/14/2015 11:43 AM Patient seen face-to-face for psychiatric evaluation, chart reviewed and case discussed with the physician extender and developed treatment plan. Reviewed the information documented and agree with the treatment plan. Thedore Mins, MD

## 2015-07-15 NOTE — BH Assessment (Signed)
[name]  This writer attempted to assess patient's mental status this date. Patient would not respond to this writer's questions. Collateral obtained from staff RN state patient still refuses PO medications and is uncooperative. [facility]. Status pending.

## 2015-07-15 NOTE — Progress Notes (Signed)
Pt presents with blunt affect and irritable mood on initial approach. Pt has been mute this shift; denies SI, HI, AVH and pain by nodding his head in disagreement when assessed this AM. Refused scheduled vitals with head node as well. Continues to refuse scheduled PO medications when offered. IM medications (see emar) administered with assistance from security staff and pt was combative at the time. Emotional support and availability provided to pt. Writer encouraged pt to voice needs and to attend to his ADLs but without avail. Safety maintained on Q 15 minutes checks as ordered.

## 2015-07-15 NOTE — BH Assessment (Signed)
[name]  At 11:22 Robinette @ [facility] reports that pt remains on their wait list.  Doylene Canninghomas Miroslava Santellan, MA Triage Specialist [phone]

## 2015-07-15 NOTE — ED Notes (Signed)
Patient noted in room. No complaints, stable, in no acute distress. Q15 minute rounds and monitoring via security cameras continue for safety. 

## 2015-07-16 NOTE — ED Notes (Signed)
ED Notes Filed  Patient came to the window asking for a snack. When I went out and talked to him he asked me if he could take his medications. He has been receiving IM medicines for several days now as he has been refusing PO. I went and got his scheduled morning medicines and he took them with a cup of water. He then asked if he would be able to take them as pills tonight as well. I assured him that I would pass the information on to the next shift. He was then given milk and graham crackers. He was asked to please consider taking a shower today.

## 2015-07-16 NOTE — ED Notes (Signed)
Patient noted in room. No complaints, stable, in no acute distress. Q15 minute rounds and monitoring via security cameras continue for safety. 

## 2015-07-16 NOTE — Consult Note (Signed)
[facility] Face-to-Face Follow Up Note   Reason for Consult:   Referring Physician:  ED Provider Patient Identification: Justin [name] MRN:  [id number] Principal Diagnosis: Paranoid schizophrenia [facility](HCC) Diagnosis:   Patient Active Problem List   Diagnosis Date Noted  . Paranoid schizophrenia (HCC) [F20.0] 07/08/2015    Priority: High  . Acute psychosis [F29]   . 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 Hx of epistaxis [Z87.09] 02/01/2015  . Schizophrenia (HCC) [F20.9] 01/31/2015  . Vitamin B12 deficiency [E53.8] 01/22/2015  . 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 No significant past medical history [IMO0001]     HPI:  Per chart record, Justin [name] is an 25 y.o. male who presents unaccompanied to [facility] ED after being petitioned for involuntary commitment by his mother, Justin RhodesLisa Shenelle [name] [phone]. Affidavit and Petition states: "Respondent is bipolar and schizophrenic and is prescribed medication for those conditions. Patient remains very paranoid, guarded, refused to cooperate.  He was offered medication but he start spitting .  Patient has previous psychiatric history and apparently noncompliant with medication.  His been decompensating.  As per chart, the family reported that he wants to kill himself and he wants to kill his brother. He recently tied himself to a pole with a wire.  He is also self medicating with alcohol and pain pills. Respondent has violent tendencies [place] by pointing a gun. Danger to self and others."  Pt has a documented history of schizophrenia.  [facility].  Today, patient willingly took his medications and talked to his nurse.  He told her he would take his medications tonight also.  Eye contact improved but will not talk to this provider.  [facility].  Total Time spent with patient: 30 minutes  Subjective:   Justin [name] is a 25 y.o.  male who needs inpatient psychiatric treatment   Past Psychiatric History: schizophrenia  Risk to Self: Suicidal Ideation: Yes-Currently Present (Pt refuses to answer questions.) Suicidal Intent: Yes-Currently Present (Pt refuses to answer questions.) Is patient at risk for suicide?: Yes (Pt refuses to answer questions.) Suicidal Plan?: No (Pt refuses to answer questions.) Access to Means: Yes (Access to gun) Specify Access to Suicidal Means: Pt reported to have access to a gun What has been your use of drugs/alcohol within the last 12 months?: Pt reported to be abusing alcohol and pain pills (Pt refuses to answer questions.) How many times?: 0 Other Self Harm Risks: Pt responding to internal stimuli Triggers for Past Attempts: None known Intentional Self Injurious Behavior: None Risk to Others: Homicidal Ideation: Yes-Currently Present (Per family, Pt has made homicidal threats.) Thoughts of Harm to Others: Yes-Currently Present (Pt refuses to answer questions.) Comment - Thoughts of Harm to Others: Family reports Pt has made threats to them Current Homicidal Intent: No (Pt refuses to answer questions.) Current Homicidal Plan: Yes-Currently Present Describe Current Homicidal Plan: Pt reported to have access to a gun Access to Homicidal Means: Yes Describe Access to Homicidal Means: Pt reported to have access to a gun Identified Victim: Mother and brother History of harm to others?: Yes Assessment of Violence: On admission Violent Behavior Description: [facility] Does patient have access to weapons?: Yes (Comment) Criminal Charges Pending?: Yes Describe Pending Criminal Charges: Assault Does patient have a court date: Yes Court Date:  (Unknown) Prior Inpatient Therapy: Prior Inpatient Therapy: Yes Prior  Therapy Dates: 01/2015 Prior Therapy Facilty/Provider(s): [facility] Reason for Treatment: Schizophrenia Prior Outpatient Therapy: Prior Outpatient Therapy:  Yes Prior Therapy Dates: Unknown Prior Therapy Facilty/Provider(s): [facility] Reason for Treatment: Schizophrenia Does patient have an ACCT team?: No Does patient have Intensive In-House Services?  : No Does patient have [facility] services? : Unknown Does patient have P4CC services?: No  Past Medical History:  Past Medical History  Diagnosis Date  . No significant past medical history   . Homelessness   . Abnormal behavior    History reviewed. No pertinent past surgical history. Family History:  Family History  Problem Relation Age of Onset  . Family history unknown: Yes   Family Psychiatric  History: see HPI Social History:  History  Alcohol Use No     History  Drug Use Not on file    Social History   Social History  . Marital Status: Single    Spouse Name: N/A  . Number of Children: N/A  . Years of Education: N/A   Social History Main Topics  . Smoking status: Unknown If Ever Smoked  . Smokeless tobacco: None  . Alcohol Use: No  . Drug Use: None  . Sexual Activity: Not Asked   Other Topics Concern  . None   Social History Narrative   ** Merged History Encounter **       Additional Social History:    Allergies:  No Known Allergies  Labs:  No results found for this or any previous visit (from the past 48 hour(s)).  Current Facility-Administered Medications  Medication Dose Route Frequency Provider Last Rate Last Dose  . benztropine (COGENTIN) tablet 1 mg  1 mg Oral BID Cleotis Nipper, MD       Or  . benztropine mesylate (COGENTIN) injection 1 mg  1 mg Intramuscular BID Cleotis Nipper, MD   1 mg at 07/15/15 2136  . citalopram (CELEXA) tablet 20 mg  20 mg Oral Daily Seleni Meller, MD   20 mg at 07/11/15 1006  . haloperidol (HALDOL) tablet 10 mg  10 mg Oral BID Cleotis Nipper, MD       Or  . haloperidol lactate (HALDOL) injection 5 mg  5 mg Intramuscular BID Cleotis Nipper, MD   5 mg at 07/15/15 2132  . haloperidol decanoate (HALDOL DECANOATE) 100 MG/ML  injection 100 mg  100 mg Intramuscular Q30 days Cleotis Nipper, MD   100 mg at 07/09/15 1203  . LORazepam (ATIVAN) tablet 1 mg  1 mg Oral BID Thedore Mins, MD       Or  . LORazepam (ATIVAN) injection 1 mg  1 mg Intramuscular BID Thedore Mins, MD   1 mg at 07/15/15 2135  . traZODone (DESYREL) tablet 100 mg  100 mg Oral QHS Thedore Mins, MD   100 mg at 07/08/15 2115   Current Outpatient Prescriptions  Medication Sig Dispense Refill  . benztropine (COGENTIN) 1 MG tablet Take 1 tablet (1 mg total) by mouth 2 (two) times daily. (Patient not taking: Reported on 07/07/2015) 60 tablet 0  . citalopram (CELEXA) 10 MG tablet Take 1.5 tablets (15 mg total) by mouth daily. (Patient not taking: Reported on 07/07/2015) 45 tablet 0  . cyanocobalamin (,VITAMIN B-12,) 1000 MCG/ML injection Inject 1 mL (1,000 mcg total) into the muscle every 7 (seven) days. On 01/13, 01/20, 01/27, and then 30 days later. (Patient not taking: Reported on 07/07/2015) 10 mL 0  . haloperidol (HALDOL) 10 MG tablet Take 1 tablet (  10 mg total) by mouth 2 (two) times daily. (Patient not taking: Reported on 07/07/2015) 60 tablet 0  . haloperidol decanoate (HALDOL DECANOATE) 100 MG/ML injection Inject 1 mL (100 mg total) into the muscle every 28 (twenty-eight) days. (Patient not taking: Reported on 07/07/2015) 1 mL 0  . ibuprofen (ADVIL,MOTRIN) 600 MG tablet Take 1 tablet (600 mg total) by mouth every 6 (six) hours as needed. (Patient not taking: Reported on 03/23/2014) 30 tablet 0  . traZODone (DESYREL) 50 MG tablet Take 1 tablet (50 mg total) by mouth at bedtime. (Patient not taking: Reported on 07/07/2015) 30 tablet 0    Musculoskeletal: Strength & Muscle Tone: within normal limits Gait & Station: normal Patient leans: N/A  Psychiatric Specialty Exam: Physical Exam  Vitals reviewed. Constitutional: He is oriented to person, place, and time. He appears well-developed and well-nourished.  HENT:  Head: Normocephalic.  Neck: Normal range  of motion.  Respiratory: Effort normal.  Musculoskeletal: Normal range of motion.  Neurological: He is alert and oriented to person, place, and time.  Skin: Skin is warm and dry.  Psychiatric: He is actively hallucinating. Thought content is paranoid. Cognition and memory are impaired. He expresses inappropriate judgment. He exhibits a depressed mood. He is noncommunicative.    Review of Systems  Constitutional: Negative.   HENT: Negative.   Eyes: Negative.   Respiratory: Negative.   Cardiovascular: Negative.   Gastrointestinal: Negative.   Genitourinary: Negative.   Musculoskeletal: Negative.   Skin: Negative.   Neurological: Negative.   Endo/Heme/Allergies: Negative.   Psychiatric/Behavioral: Positive for depression and hallucinations.    Blood pressure 95/58, pulse 86, temperature 98.3 F (36.8 C), temperature source Oral, resp. rate 16, SpO2 99 %.There is no weight on file to calculate BMI.  General Appearance: dishelved  Eye Contact:  None  Speech:  Blocked  Volume:  non verbal  Mood: Depressed  Affect:  Restricted  Thought Process:  NA  Orientation:  Other:  non verbal  Thought Content:  NA  Suicidal Thoughts:  non verbal  Homicidal Thoughts:  non verbal  Memory:  non verbal  Judgement:    non verbal  Insight:    poor  Psychomotor Activity:   normal  Concentration:    non verbal  Recall:    non verbal  Fund of Knowledge:    non verbal  Language:    non verbal  Akathisia:    non verbal  Handed:    non verbal  AIMS (if indicated):     Assets:  Social Support  ADL's:    non verbal  Cognition:    non verbal  Sleep:  poor   Treatment Plan Summary:  Diagnosis:  Paranoid schizophrenia -Crisis stabilization -Medication management:  Continue Cogentin 1 mg BID for EPS, Ativan 1 mg BID for catatonia, Trazodone 100 mg at bedtime for sleep, Celexa 20 mg daily for depression, Haldol 10 mg BID for psychosis, injectable haldol deconate monthly -Individual  counseling -Family contacted regarding his baseline  Disposition: Recommend psychiatric Inpatient admission when medically cleared.  Nanine Means, NP  07/16/2015 9:07 AM Patient seen face-to-face for psychiatric evaluation, chart reviewed and case discussed with the physician extender and developed treatment plan. Reviewed the information documented and agree with the treatment plan. Thedore Mins, MD

## 2015-07-16 NOTE — ED Notes (Signed)
Pt refused 1800 VS.

## 2015-07-16 NOTE — ED Notes (Signed)
Patient has been up and awake off and on throughout the day.  He has not really communicated with me since our encounter this morning.  He is eating well, but has still not taken a shower since he has been here.

## 2015-07-16 NOTE — ED Notes (Signed)
Pt refused 1200 VS

## 2015-07-17 NOTE — ED Notes (Signed)
[name] has been in bed all day.  No interaction with staff or peers.  He is very quiet and responded only to MD/NP today.  He took his AM medications without difficulty.  He has accepted meals and has eaten well.  He continues to refuse to have V/S monitored.  Grooming remains poor and was encouraged to shower.  He appears to be in no physical distress.  Q 15 minute checks maintained for safety.  We will continue to monitor the progress towards his goals.

## 2015-07-17 NOTE — ED Notes (Signed)
Pt refused 1200 VS

## 2015-07-17 NOTE — Progress Notes (Deleted)
Justin [name] has been in bed all day.  No interaction with staff or peers.  He is very quiet and responded only to MD/NP today.  He took his AM medications without difficulty.  He has accepted meals and has eaten well.  He continues to refuse to have V/S monitored.  Grooming remains poor and was encouraged to shower.  He appears to be in no physical distress.  Q 15 minute checks maintained for safety.  We will continue to monitor the progress towards his goals.    

## 2015-07-17 NOTE — Consult Note (Signed)
[facility] Face-to-Face Follow Up Note   Reason for Consult:   Referring Physician:  ED Provider Patient Identification: Justin [name] MRN:  [id number] Principal Diagnosis: Paranoid schizophrenia [facility](HCC) Diagnosis:   Patient Active Problem List   Diagnosis Date Noted  . Paranoid schizophrenia (HCC) [F20.0] 07/08/2015    Priority: High  . Acute psychosis [F29]   . 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 Hx of epistaxis [Z87.09] 02/01/2015  . Schizophrenia (HCC) [F20.9] 01/31/2015  . Vitamin B12 deficiency [E53.8] 01/22/2015  . 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 No significant past medical history [IMO0001]     HPI:  Per chart record, Justin [name] is an 25 y.o. male who presents unaccompanied to [facility] ED after being petitioned for involuntary commitment by his mother, Justin [name] [phone]. Affidavit and Petition states: "Respondent is bipolar and schizophrenic and is prescribed medication for those conditions. Patient remains very paranoid, guarded, refused to cooperate.  He was offered medication but he start spitting .  Patient has previous psychiatric history and apparently noncompliant with medication.  His been decompensating.  As per chart, the family reported that he wants to kill himself and he wants to kill his brother. He recently tied himself to a pole with a wire.  He is also self medicating with alcohol and pain pills. Respondent has violent tendencies [place] by pointing a gun. Danger to self and others."  Pt has a documented history of schizophrenia.  [facility].  Today, patient willingly took his medications but remains nonverbal, poor eye contact, hypervigilant stare.  He is not bathing despite much encouragement.    Total Time spent with patient: 30 minutes  Subjective:   Justin [name] is a 25 y.o. male who needs inpatient psychiatric treatment   Past  Psychiatric History: schizophrenia  Risk to Self: Suicidal Ideation: Yes-Currently Present (Pt refuses to answer questions.) Suicidal Intent: Yes-Currently Present (Pt refuses to answer questions.) Is patient at risk for suicide?: Yes (Pt refuses to answer questions.) Suicidal Plan?: No (Pt refuses to answer questions.) Access to Means: Yes (Access to gun) Specify Access to Suicidal Means: Pt reported to have access to a gun What has been your use of drugs/alcohol within the last 12 months?: Pt reported to be abusing alcohol and pain pills (Pt refuses to answer questions.) How many times?: 0 Other Self Harm Risks: Pt responding to internal stimuli Triggers for Past Attempts: None known Intentional Self Injurious Behavior: None Risk to Others: Homicidal Ideation: Yes-Currently Present (Per family, Pt has made homicidal threats.) Thoughts of Harm to Others: Yes-Currently Present (Pt refuses to answer questions.) Comment - Thoughts of Harm to Others: Family reports Pt has made threats to them Current Homicidal Intent: No (Pt refuses to answer questions.) Current Homicidal Plan: Yes-Currently Present Describe Current Homicidal Plan: Pt reported to have access to a gun Access to Homicidal Means: Yes Describe Access to Homicidal Means: Pt reported to have access to a gun Identified Victim: Mother and brother History of harm to others?: Yes Assessment of Violence: On admission Violent Behavior Description: [facility] Does patient have access to weapons?: Yes (Comment) Criminal Charges Pending?: Yes Describe Pending Criminal Charges: Assault Does patient have a court date: Yes Court Date:  (Unknown) Prior Inpatient Therapy: Prior Inpatient Therapy: Yes Prior Therapy Dates: 01/2015 [facility] Reason for Treatment: Schizophrenia Prior Outpatient Therapy: Prior Outpatient  Therapy: Yes Prior Therapy Dates: Unknown Prior Therapy  Facilty/Provider(s): [facility] Reason for Treatment: Schizophrenia Does patient have an ACCT team?: No Does patient have Intensive In-House Services?  : No Does patient have [facility] services? : Unknown Does patient have P4CC services?: No  Past Medical History:  Past Medical History  Diagnosis Date  . No significant past medical history   . Homelessness   . Abnormal behavior    History reviewed. No pertinent past surgical history. Family History:  Family History  Problem Relation Age of Onset  . Family history unknown: Yes   Family Psychiatric  History: see HPI Social History:  History  Alcohol Use No     History  Drug Use Not on file    Social History   Social History  . Marital Status: Single    Spouse Name: N/A  . Number of Children: N/A  . Years of Education: N/A   Social History Main Topics  . Smoking status: Unknown If Ever Smoked  . Smokeless tobacco: None  . Alcohol Use: No  . Drug Use: None  . Sexual Activity: Not Asked   Other Topics Concern  . None   Social History Narrative   ** Merged History Encounter **       Additional Social History:    Allergies:  No Known Allergies  Labs:  No results found for this or any previous visit (from the past 48 hour(s)).  Current Facility-Administered Medications  Medication Dose Route Frequency Provider Last Rate Last Dose  . benztropine (COGENTIN) tablet 1 mg  1 mg Oral BID Cleotis Nipper, MD   1 mg at 07/17/15 1008   Or  . benztropine mesylate (COGENTIN) injection 1 mg  1 mg Intramuscular BID Cleotis Nipper, MD   1 mg at 07/15/15 2136  . citalopram (CELEXA) tablet 20 mg  20 mg Oral Daily Asalee Barrette, MD   20 mg at 07/17/15 1008  . haloperidol (HALDOL) tablet 10 mg  10 mg Oral BID Cleotis Nipper, MD   10 mg at 07/17/15 1008   Or  . haloperidol lactate (HALDOL) injection 5 mg  5 mg Intramuscular BID Cleotis Nipper, MD   5 mg at 07/15/15 2132  . haloperidol decanoate (HALDOL DECANOATE) 100 MG/ML injection  100 mg  100 mg Intramuscular Q30 days Cleotis Nipper, MD   100 mg at 07/09/15 1203  . LORazepam (ATIVAN) tablet 1 mg  1 mg Oral BID Thedore Mins, MD   1 mg at 07/17/15 1008   Or  . LORazepam (ATIVAN) injection 1 mg  1 mg Intramuscular BID Thedore Mins, MD   1 mg at 07/15/15 2135  . traZODone (DESYREL) tablet 100 mg  100 mg Oral QHS Thedore Mins, MD   100 mg at 07/16/15 2102   Current Outpatient Prescriptions  Medication Sig Dispense Refill  . benztropine (COGENTIN) 1 MG tablet Take 1 tablet (1 mg total) by mouth 2 (two) times daily. (Patient not taking: Reported on 07/07/2015) 60 tablet 0  . citalopram (CELEXA) 10 MG tablet Take 1.5 tablets (15 mg total) by mouth daily. (Patient not taking: Reported on 07/07/2015) 45 tablet 0  . cyanocobalamin (,VITAMIN B-12,) 1000 MCG/ML injection Inject 1 mL (1,000 mcg total) into the muscle every 7 (seven) days. On 01/13, 01/20, 01/27, and then 30 days later. (Patient not taking: Reported on 07/07/2015) 10 mL 0  . haloperidol (HALDOL) 10 MG tablet Take 1 tablet (10 mg total) by mouth 2 (two) times  daily. (Patient not taking: Reported on 07/07/2015) 60 tablet 0  . haloperidol decanoate (HALDOL DECANOATE) 100 MG/ML injection Inject 1 mL (100 mg total) into the muscle every 28 (twenty-eight) days. (Patient not taking: Reported on 07/07/2015) 1 mL 0  . ibuprofen (ADVIL,MOTRIN) 600 MG tablet Take 1 tablet (600 mg total) by mouth every 6 (six) hours as needed. (Patient not taking: Reported on 03/23/2014) 30 tablet 0  . traZODone (DESYREL) 50 MG tablet Take 1 tablet (50 mg total) by mouth at bedtime. (Patient not taking: Reported on 07/07/2015) 30 tablet 0    Musculoskeletal: Strength & Muscle Tone: within normal limits Gait & Station: normal Patient leans: N/A  Psychiatric Specialty Exam: Physical Exam  Vitals reviewed. Constitutional: He is oriented to person, place, and time. He appears well-developed and well-nourished.  HENT:  Head: Normocephalic.  Neck:  Normal range of motion.  Respiratory: Effort normal.  Musculoskeletal: Normal range of motion.  Neurological: He is alert and oriented to person, place, and time.  Skin: Skin is warm and dry.  Psychiatric: He is actively hallucinating. Thought content is paranoid. Cognition and memory are impaired. He expresses inappropriate judgment. He exhibits a depressed mood. He is noncommunicative.    Review of Systems  Constitutional: Negative.   HENT: Negative.   Eyes: Negative.   Respiratory: Negative.   Cardiovascular: Negative.   Gastrointestinal: Negative.   Genitourinary: Negative.   Musculoskeletal: Negative.   Skin: Negative.   Neurological: Negative.   Endo/Heme/Allergies: Negative.   Psychiatric/Behavioral: Positive for depression and hallucinations.    Blood pressure 95/58, pulse 86, temperature 98.3 F (36.8 C), temperature source Oral, resp. rate 16, SpO2 99 %.There is no weight on file to calculate BMI.  General Appearance: dishelved  Eye Contact:  None  Speech:  Blocked  Volume:  non verbal  Mood: Depressed  Affect:  Restricted  Thought Process:  NA  Orientation:  Other:  non verbal  Thought Content:  NA  Suicidal Thoughts:  non verbal  Homicidal Thoughts:  non verbal  Memory:  non verbal  Judgement:    non verbal  Insight:    poor  Psychomotor Activity:   normal  Concentration:    non verbal  Recall:    non verbal  Fund of Knowledge:    non verbal  Language:    non verbal  Akathisia:    non verbal  Handed:    non verbal  AIMS (if indicated):     Assets:  Social Support  ADL's:    non verbal  Cognition:    non verbal  Sleep:  poor   Treatment Plan Summary:  Diagnosis:  Paranoid schizophrenia -Crisis stabilization -Medication management:  Continue Cogentin 1 mg BID for EPS, Ativan 1 mg BID for catatonia, Trazodone 100 mg at bedtime for sleep, Celexa 20 mg daily for depression, Haldol 10 mg BID for psychosis, injectable haldol deconate monthly -Individual  counseling -Remains on [facility] waitlist due to acuity  Disposition: Recommend psychiatric Inpatient admission when medically cleared.  Nanine Means, NP  07/17/2015 10:27 AM Patient seen face-to-face for psychiatric evaluation, chart reviewed and case discussed with the physician extender and developed treatment plan. Reviewed the information documented and agree with the treatment plan. Thedore Mins, MD

## 2015-07-18 ENCOUNTER — Encounter (HOSPITAL_COMMUNITY): Payer: Self-pay | Admitting: *Deleted

## 2015-07-18 ENCOUNTER — Inpatient Hospital Stay (HOSPITAL_COMMUNITY)
Admission: AD | Admit: 2015-07-18 | Discharge: 2015-07-26 | DRG: 885 | Disposition: A | Discharge status: Home / Self Care | Payer: No Typology Code available for payment source | Source: Intra-hospital | Attending: Psychiatry | Admitting: Psychiatry

## 2015-07-18 DIAGNOSIS — F17200 Nicotine dependence, unspecified, uncomplicated: Secondary | ICD-10-CM | POA: Diagnosis present

## 2015-07-18 DIAGNOSIS — Z818 Family history of other mental and behavioral disorders: Secondary | ICD-10-CM

## 2015-07-18 DIAGNOSIS — E538 Deficiency of other specified B group vitamins: Secondary | ICD-10-CM | POA: Diagnosis not present

## 2015-07-18 DIAGNOSIS — Z9114 Patient's other noncompliance with medication regimen: Secondary | ICD-10-CM | POA: Diagnosis not present

## 2015-07-18 DIAGNOSIS — F068 Other specified mental disorders due to known physiological condition: Secondary | ICD-10-CM | POA: Diagnosis not present

## 2015-07-18 DIAGNOSIS — F200 Paranoid schizophrenia: Secondary | ICD-10-CM | POA: Diagnosis not present

## 2015-07-18 DIAGNOSIS — F09 Unspecified mental disorder due to known physiological condition: Secondary | ICD-10-CM | POA: Diagnosis present

## 2015-07-18 DIAGNOSIS — R45851 Suicidal ideations: Secondary | ICD-10-CM | POA: Diagnosis present

## 2015-07-18 MED ORDER — ZIPRASIDONE MESYLATE 20 MG IM SOLR: 20.0000 mg | INTRAMUSCULAR | Status: DC | PRN

## 2015-07-18 MED ORDER — BENZTROPINE MESYLATE 1 MG/ML IJ SOLN
1.0000 mg | Freq: Two times a day (BID) | INTRAMUSCULAR | Status: DC
  Filled 2015-07-18 (×2): qty 1

## 2015-07-18 MED ORDER — TRAZODONE HCL 100 MG PO TABS
100.0000 mg | ORAL_TABLET | Freq: Every day | ORAL | Status: DC
  Filled 2015-07-18: qty 1

## 2015-07-18 MED ORDER — ALUM & MAG HYDROXIDE-SIMETH 200-200-20 MG/5ML PO SUSP: 30.0000 mL | ORAL | Status: DC | PRN

## 2015-07-18 MED ORDER — BENZTROPINE MESYLATE 1 MG PO TABS: 1.0000 mg | ORAL_TABLET | Freq: Two times a day (BID) | ORAL | Status: DC

## 2015-07-18 MED ORDER — TRAZODONE HCL 100 MG PO TABS: 100.0000 mg | ORAL_TABLET | Freq: Every day | ORAL | Status: DC

## 2015-07-18 MED ORDER — OLANZAPINE 5 MG PO TBDP: 5.0000 mg | ORAL_TABLET | Freq: Three times a day (TID) | ORAL | Status: DC | PRN

## 2015-07-18 MED ORDER — LORAZEPAM 1 MG PO TABS
1.0000 mg | ORAL_TABLET | Freq: Two times a day (BID) | ORAL | Status: DC
  Administered 2015-07-18 – 2015-07-26 (×16): 1 mg via ORAL
  Filled 2015-07-18 (×16): qty 1

## 2015-07-18 MED ORDER — HALOPERIDOL LACTATE 5 MG/ML IJ SOLN
5.0000 mg | Freq: Two times a day (BID) | INTRAMUSCULAR | Status: DC
Start: 2015-07-18 — End: 2015-07-18
  Filled 2015-07-18 (×2): qty 1

## 2015-07-18 MED ORDER — CITALOPRAM HYDROBROMIDE 20 MG PO TABS
20.0000 mg | ORAL_TABLET | Freq: Every day | ORAL | Status: DC
  Filled 2015-07-18: qty 1

## 2015-07-18 MED ORDER — CITALOPRAM HYDROBROMIDE 20 MG PO TABS: 20.0000 mg | ORAL_TABLET | Freq: Every day | ORAL | Status: DC

## 2015-07-18 MED ORDER — LORAZEPAM 2 MG/ML IJ SOLN
1.0000 mg | Freq: Two times a day (BID) | INTRAMUSCULAR | Status: DC
Start: 2015-07-18 — End: 2015-07-18

## 2015-07-18 MED ORDER — TRAZODONE HCL 100 MG PO TABS
100.0000 mg | ORAL_TABLET | Freq: Every day | ORAL | Status: DC
  Administered 2015-07-18 – 2015-07-25 (×8): 100 mg via ORAL
  Filled 2015-07-18: qty 7
  Filled 2015-07-18 (×9): qty 1

## 2015-07-18 MED ORDER — BENZTROPINE MESYLATE 1 MG PO TABS
1.0000 mg | ORAL_TABLET | Freq: Two times a day (BID) | ORAL | Status: DC
  Filled 2015-07-18 (×2): qty 1

## 2015-07-18 MED ORDER — LORAZEPAM 1 MG PO TABS: 1.0000 mg | ORAL_TABLET | Freq: Two times a day (BID) | ORAL | Status: DC

## 2015-07-18 MED ORDER — ACETAMINOPHEN 325 MG PO TABS: 650.0000 mg | ORAL_TABLET | Freq: Four times a day (QID) | ORAL | Status: DC | PRN

## 2015-07-18 MED ORDER — MAGNESIUM HYDROXIDE 400 MG/5ML PO SUSP: 30.0000 mL | Freq: Every day | ORAL | Status: DC | PRN

## 2015-07-18 MED ORDER — CITALOPRAM HYDROBROMIDE 20 MG PO TABS
20.0000 mg | ORAL_TABLET | Freq: Every day | ORAL | Status: DC
  Administered 2015-07-19 – 2015-07-20 (×2): 20 mg via ORAL
  Filled 2015-07-18 (×4): qty 1

## 2015-07-18 MED ORDER — HALOPERIDOL 10 MG PO TABS: 10.0000 mg | ORAL_TABLET | Freq: Two times a day (BID) | ORAL | Status: DC

## 2015-07-18 MED ORDER — BENZTROPINE MESYLATE 1 MG PO TABS
1.0000 mg | ORAL_TABLET | Freq: Two times a day (BID) | ORAL | Status: DC
  Administered 2015-07-18 – 2015-07-26 (×16): 1 mg via ORAL
  Filled 2015-07-18 (×13): qty 1
  Filled 2015-07-18: qty 14
  Filled 2015-07-18 (×2): qty 1
  Filled 2015-07-18: qty 14
  Filled 2015-07-18 (×2): qty 1

## 2015-07-18 MED ORDER — HALOPERIDOL DECANOATE 100 MG/ML IM SOLN: 100.0000 mg | INTRAMUSCULAR | Status: DC

## 2015-07-18 MED ORDER — HALOPERIDOL 5 MG PO TABS
10.0000 mg | ORAL_TABLET | Freq: Two times a day (BID) | ORAL | Status: DC
  Administered 2015-07-18 – 2015-07-21 (×6): 10 mg via ORAL
  Filled 2015-07-18 (×13): qty 2

## 2015-07-18 MED ORDER — LORAZEPAM 1 MG PO TABS: 1.0000 mg | ORAL_TABLET | ORAL | Status: DC | PRN

## 2015-07-18 MED ORDER — HALOPERIDOL 5 MG PO TABS
10.0000 mg | ORAL_TABLET | Freq: Two times a day (BID) | ORAL | Status: DC
  Filled 2015-07-18 (×2): qty 2

## 2015-07-18 NOTE — Progress Notes (Signed)
[facility]  Patient in room resting in bed and states he does not need anything at the moment.

## 2015-07-18 NOTE — BH Assessment (Addendum)
Per Dr. Darleene Cleaver and Waylan Boga, DNP, patient is psychiatrically cleared. Family contacted to notify of discharge and also to inquire when someone would be available to pick patient up from ED. Writer contacted patient's mother, Gwenith Daily [phone], left voicemail. Writer also contacted patient's father, Juanda Chance [phone], left a voicemail. Writer met with patient face to face to see if he had any other family members names or contact numbers. Patient refuse to respond to this Probation officer. Patient continued to lay in the bed staring at the wall. Writer will continue trying to reach out to this patient's family.
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
## 2015-07-18 NOTE — Progress Notes (Signed)
Noting pt without the previous resources Cm provided him  CM placed a copy of same uninsured resources in pt belonging bag in locker #40 Provided written information to assist pt with determining choice for uninsured accepting pcps, discussed the importance of pcp vs EDP services for f/u care, www.needymeds.org, www.goodrx.com, discounted pharmacies and other [place] resources such as [facility] , [facility], affordable care act, financial assistance, uninsured dental services, [place] med assist, DSS and  health department  Reviewed resources for [place] uninsured accepting pcps like [facility], family medicine at [address], [facility], [facility], local urgent care centers, [facility], [facility] family [facility], general medical clinics, [facility] services of the [place], [facility] urgent care plus others, medication resources, [facility] out patient pharmacies and housing

## 2015-07-18 NOTE — Progress Notes (Signed)
Admission Note:  25 year old male who presents IVC, in no acute distress. Per report, patient's family reported that patient wanted to kill himself and his brother.  Patient has a diagnosis of Bipolar and Schizophrenia.  Patient was mute during the majority of the admission.  Patient was able to follow verbal direction.  Patient answered question regarding food/drink and stated "I want to go to my room".  Patient was calm during admission process.  Patient had minimal interaction with staff. Patient was cooperative with skin search and vital sign collection. Skin was assessed and found to be clear of any abnormal marks apart from dry, cracking feet bilateral.  Search completed. No contraband found, POC and unit policies explained and understanding verbalized. Consents obtained. Food and fluids offered and accepted.  Patient had no additional questions or concerns.

## 2015-07-18 NOTE — [facility] Suicide Risk Assessment (Deleted)
Suicide Risk Assessment  Discharge Assessment   [facility] Discharge Suicide Risk Assessment   Principal Problem: Paranoid schizophrenia [facility]) Discharge Diagnoses:  Patient Active Problem List   Diagnosis Date Noted  . Paranoid schizophrenia (HCC) [F20.0] 07/08/2015    Priority: High  . Acute psychosis [F29]   . 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 Hx of epistaxis [Z87.09] 02/01/2015  . Schizophrenia (HCC) [F20.9] 01/31/2015  . Vitamin B12 deficiency [E53.8] 01/22/2015  . 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 No significant past medical history [IMO0001]     Total Time spent with patient: 30 minutes   Musculoskeletal: Strength & Muscle Tone: within normal limits Gait & Station: normal Patient leans: N/A  Psychiatric Specialty Exam: Physical Exam  Vitals reviewed. Constitutional: He is oriented to person, place, and time. He appears well-developed and well-nourished.  HENT:  Head: Normocephalic.  Neck: Normal range of motion.  Respiratory: Effort normal.  Musculoskeletal: Normal range of motion.  Neurological: He is alert and oriented to person, place, and time.  Skin: Skin is warm and dry.  Psychiatric: He is actively hallucinating. Thought content is paranoid. Cognition and memory are impaired. He expresses inappropriate judgment. He exhibits a depressed mood. He is noncommunicative.    Review of Systems  Constitutional: Negative.   HENT: Negative.   Eyes: Negative.   Respiratory: Negative.   Cardiovascular: Negative.   Gastrointestinal: Negative.   Genitourinary: Negative.   Musculoskeletal: Negative.   Skin: Negative.   Neurological: Negative.   Endo/Heme/Allergies: Negative.   Psychiatric/Behavioral: Positive for depression and hallucinations.    Blood pressure 95/58, pulse 86, temperature 98.3 F (36.8 C), temperature source Oral, resp. rate 16, SpO2 99 %.There is no weight on file to calculate BMI.  General Appearance: dishelved  Eye Contact:  None   Speech:  Blocked  Volume:  non verbal  Mood: euthymic  Affect:  Restricted  Thought Process:  NA  Orientation:  Other:  non verbal  Thought Content:  NA  Suicidal Thoughts:  non verbal  Homicidal Thoughts:  non verbal  Memory:  non verbal  Judgement:    non verbal  Insight:    poor  Psychomotor Activity:   normal  Concentration:    non verbal  Recall:    non verbal  Fund of Knowledge:    non verbal  Language:    non verbal  Akathisia:    non verbal  Handed:    non verbal  AIMS (if indicated):     Assets:  Social Support  ADL's:    non verbal  Cognition:    non verbal  Sleep:  good   Mental Status Per Nursing Assessment::   On Admission:   Hallucinations  Demographic Factors:  Male  Loss Factors: NA  Historical Factors: NA  Risk Reduction Factors:   Sense of responsibility to family, Living with another person, especially a relative, Positive social support and Positive therapeutic relationship  Continued Clinical Symptoms:  Selective mutism  Cognitive Features That Contribute To Risk:  None    Suicide Risk:  Minimal: No identifiable suicidal ideation.  Patients presenting with no risk factors but with morbid ruminations; may be classified as minimal risk based on the severity of the depressive symptoms  Follow-up Information    Follow up with Please use the resources provided to you in emergency room by case manager to assist you're your choice of doctor for follow up .   Contact information:   These [place] uninsured resources provide possible primary  care providers, resources for discounted medications, housing, dental resources, affordable care act information, plus other resources for [place]        Plan Of Care/Follow-up recommendations:  Activity:  as tolerated Diet:  heart healthy diet  LORD, JAMISON, NP 07/18/2015, 1:25 PM

## 2015-07-18 NOTE — Tx Team (Signed)
Initial Interdisciplinary Treatment Plan   PATIENT STRESSORS: Marital or family conflict Medication change or noncompliance   PATIENT STRENGTHS: General fund of knowledge Physical Health Supportive family/friends   PROBLEM LIST: Problem List/Patient Goals Date to be addressed Date deferred Reason deferred Estimated date of resolution  Agression 07/18/2015  07/18/2015   D/C  Psychosis 07/18/2015  07/18/2015   D/C  Patient refused to respond during admission. 07/18/2015  07/18/2015   D/C                                       DISCHARGE CRITERIA:  Ability to meet basic life and health needs Adequate post-discharge living arrangements Improved stabilization in mood, thinking, and/or behavior Motivation to continue treatment in a less acute level of care Need for constant or close observation no longer present Verbal commitment to aftercare and medication compliance  PRELIMINARY DISCHARGE PLAN: Outpatient therapy Return to previous living arrangement  PATIENT/FAMIILY INVOLVEMENT: This treatment plan has been presented to and reviewed with the patient, Justin [name].  The patient and family have been given the opportunity to ask questions and make suggestions.  Larry SierrasMiddleton, Tremon Sainvil P 07/18/2015, 8:33 PM

## 2015-07-18 NOTE — Progress Notes (Signed)
Spoke with Zella BallRobin with [facility] who states patient remains on the wait list.   Elenore PaddyLaVonia Gaylynn Seiple, LCSWA [phone] ED CSW 07/18/2015 8:47 AM

## 2015-07-18 NOTE — Progress Notes (Signed)
Entered in d/c instructions Please use the resources provided to you in emergency room by case manager to assist you're your choice of doctor for follow up  These [place] uninsured resources provide possible primary care providers, resources for discounted medications, housing, dental resources, affordable care act information, plus other resources for [place]   

## 2015-07-18 NOTE — ED Notes (Signed)
Patient has not spoken today thus far.  He stares blankly when asked questions.  I have attempted to discern whether or not he has a place to live if he were discharged today.  He will not speak or move his head.  He also has a very strong odor and refuses to take a shower.  He has been asked on multiple occasions.

## 2015-07-18 NOTE — ED Notes (Signed)
[facility]  Left the unit ambulatory with [employer].  All belongings given to the transport team.  Patient was just finishing his dinner with the transport team arrived.

## 2015-07-19 ENCOUNTER — Encounter (HOSPITAL_COMMUNITY): Payer: Self-pay | Admitting: Psychiatry

## 2015-07-19 DIAGNOSIS — F068 Other specified mental disorders due to known physiological condition: Secondary | ICD-10-CM

## 2015-07-19 DIAGNOSIS — F200 Paranoid schizophrenia: Principal | ICD-10-CM

## 2015-07-19 LAB — BASIC METABOLIC PANEL
Anion gap: 6 (ref 5–15)
BUN: 18 mg/dL (ref 6–20)
CHLORIDE: 103 mmol/L (ref 101–111)
CO2: 30 mmol/L (ref 22–32)
CREATININE: 1.11 mg/dL (ref 0.61–1.24)
Calcium: 9.5 mg/dL (ref 8.9–10.3)
GFR calc Af Amer: >60 mL/min (ref >60)
GFR calc non Af Amer: >60 mL/min (ref >60)
GLUCOSE: 93 mg/dL (ref 65–99)
POTASSIUM: 4.1 mmol/L (ref 3.5–5.1)
SODIUM: 139 mmol/L (ref 135–145)

## 2015-07-19 LAB — LIPID PANEL
CHOL/HDL RATIO: 4 ratio
Cholesterol: 167 mg/dL (ref 0–200)
HDL: 42 mg/dL (ref >40)
LDL Cholesterol: 113 mg/dL — ABNORMAL HIGH (ref 0–99)
TRIGLYCERIDES: 60 mg/dL (ref <150)
VLDL: 12 mg/dL (ref 0–40)

## 2015-07-19 LAB — TSH: TSH: 0.413 u[IU]/mL (ref 0.350–4.500)

## 2015-07-19 LAB — VITAMIN B12: Vitamin B-12: 172 pg/mL — ABNORMAL LOW (ref 180–914)

## 2015-07-19 MED ORDER — LORAZEPAM 1 MG PO TABS: 1.0000 mg | ORAL_TABLET | Freq: Four times a day (QID) | ORAL | Status: DC | PRN

## 2015-07-19 MED ORDER — HALOPERIDOL DECANOATE 100 MG/ML IM SOLN: 100.0000 mg | INTRAMUSCULAR | Status: DC

## 2015-07-19 MED ORDER — ZIPRASIDONE HCL 20 MG PO CAPS: 20.0000 mg | ORAL_CAPSULE | Freq: Two times a day (BID) | ORAL | Status: DC | PRN

## 2015-07-19 MED ORDER — LORAZEPAM 2 MG/ML IJ SOLN: 1.0000 mg | Freq: Four times a day (QID) | INTRAMUSCULAR | Status: DC | PRN

## 2015-07-19 MED ORDER — ZIPRASIDONE MESYLATE 20 MG IM SOLR: 10.0000 mg | Freq: Two times a day (BID) | INTRAMUSCULAR | Status: DC | PRN

## 2015-07-19 NOTE — Progress Notes (Signed)
Adult Psychoeducational Group Note  Date:  07/19/2015 Time:  8:56 PM  Group Topic/Focus:  Wrap-Up Group:   The focus of this group is to help patients review their daily goal of treatment and discuss progress on daily workbooks.  Participation Level:  Did Not Attend  Participation Quality:  Did not attend  Affect:  Did not attend  Cognitive:  Did not attend  Insight: None  Engagement in Group:  Did not attend  Modes of Intervention:  Did not attend  Additional Comments:  Patient did not attend wrap up group tonight.   [name] L [name] 07/19/2015, 8:56 PM

## 2015-07-19 NOTE — Progress Notes (Addendum)
Patient ID: Justin BartersRobert E [name], male   DOB: April 05, 1990, 25 y.o.   MRN: [id number]   Pt currently presents with a flat affect and isolative behavior. Pt electively mute, does not respond when prompted. Per self inventory, pt rates depression, hopelessnes and anxiety at a 0. Pt reports not having a daily goal is today. Pt reports good sleep, a good appetite, normal energy and good concentration. All information gathered from self inventory sheet. Pt had tears coming down his face during a conversation but did not speak.   Pt provided with medications per providers orders. Pt's labs and vitals were monitored throughout the day. Pt supported emotionally and encouraged to express concerns and questions. Pt educated on medications.   Pt's safety ensured with 15 minute and environmental checks. Unable to assess current SI/HI and A/V hallucinations. Pt monitored throughout the day. Will continue POC.

## 2015-07-19 NOTE — Progress Notes (Signed)
Patient ID: Justin BartersRobert E Jakubiak, male   DOB: 1990-04-16, 25 y.o.   MRN: [id number]   Pt given 1:1. Pt did not respond to Clinical research associatewriter. When asked if he needed anything, pt makes a hmm sound and spits at Emerson Electricwriter. Pt asked to use words or gestures in further interactions.

## 2015-07-19 NOTE — [facility] Suicide Risk Assessment (Signed)
[facility]   Nursing information obtained from:  Other (Comment) (UTA) Demographic factors:  Male (Pt refused to respond) Current Mental Status:  Thoughts of violence towards others, Plan to harm others Loss Factors:   (Pt refused to respond) Historical Factors:  NA (Pt refused to respond) Risk Reduction Factors:  NA (Pt refuses to respond)  Total Time spent with patient: 30 minutes Principal Problem: Schizophrenia, paranoid type (HCC) Diagnosis:   Patient Active Problem List   Diagnosis Date Noted  . 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 Acute psychosis [F29]   . Paranoid schizophrenia (HCC) [F20.0] 07/08/2015  . 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 Hx of epistaxis [Z87.09] 02/01/2015  . Schizophrenia (HCC) [F20.9] 01/31/2015  . Vitamin B12 deficiency [E53.8] 01/22/2015  . 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 No significant past medical history [IMO0001]    Subjective Data: Please see H&P.   Continued Clinical Symptoms:    The "Alcohol Use Disorders Identification Test", Guidelines for Use in Primary Care, Second Edition.  World Science writerHealth Organization [facility](WHO). Score between 0-7:  no or low risk or alcohol related problems. Score between 8-15:  moderate risk of alcohol related problems. Score between 16-19:  high risk of alcohol related problems. Score 20 or above:  warrants further diagnostic evaluation for alcohol dependence and treatment.   CLINICAL FACTORS:   Schizophrenia:   Less than 27 years old Paranoid or undifferentiated type Currently Psychotic   Musculoskeletal: Strength & Muscle Tone: within normal limits Gait & Station: normal Patient leans: N/A  Psychiatric Specialty Exam: Physical Exam  Review of Systems  Unable to perform ROS: mental acuity    Blood pressure 87/55, pulse 72, temperature 98.3 F (36.8 C), temperature source Oral, resp. rate 20.There is no weight on file to calculate BMI.                 Please see H&P.                                           COGNITIVE FEATURES THAT CONTRIBUTE TO RISK:  Closed-mindedness, Polarized thinking and Thought constriction (tunnel vision)    SUICIDE RISK:   Moderate:  Frequent suicidal ideation with limited intensity, and duration, some specificity in terms of plans, no associated intent, good self-control, limited dysphoria/symptomatology, some risk factors present, and identifiable protective factors, including available and accessible social support.  PLAN OF CARE: Please see H&P.   I certify that inpatient services furnished can reasonably be expected to improve the patient's condition.   Carmel Waddington, MD 07/19/2015, 2:32 PM

## 2015-07-19 NOTE — Progress Notes (Signed)
D:Patient in his room awake in bed on approach.  Patient did not respond to Clinical research associatewriter at first but Clinical research associatewriter offered patient food and then he began to respond.  Patient responds minimally but follows verbal commands.  Writer is unable to assess suicidality because when asked patient will not respond.  Patient also will not respond to the question of HI or AVH.  Patient did take snacks from Clinical research associatewriter and he is drinking fluids. A: Staff to monitor Q 15 mins for safety.  Encouragement and support offered.  Scheduled medications administered per orders. R: Patient remains safe on the unit.  Patient did not attend group tonight.  Patient not visible on the unit.  Patient taking administered medications.

## 2015-07-19 NOTE — [facility] Group Notes (Signed)
[facility] LCSW Group Therapy  07/19/2015 , 2:35 PM   Type of Therapy:  Group Therapy  Participation Level:  Active  Participation Quality:  Attentive  Affect:  Appropriate  Cognitive:  Alert  Insight:  Improving  Engagement in Therapy:  Engaged  Modes of Intervention:  Discussion, Exploration and Socialization  Summary of Progress/Problems: Today's group focused on the term Diagnosis.  Participants were asked to define the term, and then pronounce whether it is a negative, positive or neutral term. Invited.  Chose to not attend.  Daryel Geraldorth, Doak Mah B 07/19/2015 , 2:35 PM

## 2015-07-19 NOTE — Progress Notes (Signed)
Patient ID: Justin BartersRobert E [name], male   DOB: [birth year], 25 y.o.   MRN: [id number]  Adult Psychoeducational Group Note  Date:  07/19/2015 Time:09:30am  Group Topic/Focus:  Recovery Goals:   The focus of this group is to identify appropriate goals for recovery and establish a plan to achieve them.  Participation Level:  Did Not Attend  Participation Quality:  n/a  Affect:  n/a  Cognitive: n/a  Insight: n/a  Engagement in Group: n/a  Modes of Intervention:  n/a  Additional Comments:  Pt did not attend group, pt in bed asleep.   Aurora Maskwyman, Kiana Hollar E 07/19/2015, 10:20 AM

## 2015-07-19 NOTE — Progress Notes (Signed)
[facility]  Patient in bed.  Patient will not speak to writer but did nod his head yes and no when asked some questions.  Patient did take a snack and bedtime medications from Buyer, retailwriter tonight.
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
## 2015-07-19 NOTE — Progress Notes (Signed)
Patient lacks capacity to participate in disposition planning.  Justin [name] ,MD Attending Psychiatrist  [facility]  

## 2015-07-20 ENCOUNTER — Encounter (HOSPITAL_COMMUNITY): Payer: Self-pay | Admitting: *Deleted

## 2015-07-20 DIAGNOSIS — E538 Deficiency of other specified B group vitamins: Secondary | ICD-10-CM

## 2015-07-20 LAB — HEMOGLOBIN A1C
HEMOGLOBIN A1C: 4.7 % — AB (ref 4.8–5.6)
MEAN PLASMA GLUCOSE: 88 mg/dL

## 2015-07-20 MED ORDER — CYANOCOBALAMIN 1000 MCG/ML IJ SOLN
100.0000 ug | Freq: Every day | INTRAMUSCULAR | Status: DC
  Administered 2015-07-20: 100 ug via SUBCUTANEOUS
  Filled 2015-07-20 (×4): qty 0.1

## 2015-07-20 MED ORDER — CITALOPRAM HYDROBROMIDE 40 MG PO TABS
40.0000 mg | ORAL_TABLET | Freq: Every day | ORAL | Status: DC
  Administered 2015-07-21 – 2015-07-26 (×6): 40 mg via ORAL
  Filled 2015-07-20: qty 1
  Filled 2015-07-20: qty 7
  Filled 2015-07-20 (×5): qty 1

## 2015-07-20 NOTE — Progress Notes (Addendum)
D:  Patient in room, awake and lying in bed. He is calm but does not respond verbally when prompted. He did not respond verbally when asked if he has any SI, HI, or AVH. He did, however, deny suicidal ideation on the written "Patient Self Inventory Form" which he filled out. On this form he denies pain, reports "good" sleep, and reports "poor" appetite. He is cooperative with taking medications. No self-injurious behaviors noted or reported. A: Scheduled medications given as ordered.  Emotional support and encouragement offered.  Encouraged him to seek assistance with needs/concerns. R: Safety maintained on unit. Will remain on q 15 minute safety checks.

## 2015-07-20 NOTE — Plan of Care (Signed)
Problem: Activity: Goal: Sleeping patterns will improve Outcome: Progressing Patient reports sleeping well on written "Patient Self Inventory" form  Problem: Safety: Goal: Periods of time without injury will increase Outcome: Progressing Safety maintained on unit

## 2015-07-20 NOTE — BHH Group Notes (Signed)
[facility]  07/20/2015 , 1:11 PM    Type of Therapy:  [facility] Presentation  Participation Level:  Active  Participation Quality:  Attentive  Affect:  Blunted  Cognitive:  Oriented  Insight:  Limited  Engagement in Therapy:  Engaged  Modes of Intervention:  Discussion, Education and Socialization  Summary of Progress/Problems:  Onalee HuaDavid from [employer] came to present his recovery story and play the guitar.  Invited.  In bed awake.  Declined to attend.  Daryel Geraldorth, Orey Moure B 07/20/2015 , 1:11 PM

## 2015-07-20 NOTE — Progress Notes (Addendum)
[facility] MD Progress Note  07/20/2015 4:11 PM Justin [name]  MRN:  [id number] Subjective: Pt is mute.  Objective::Justin [name] is a 25 y.o. male who has a hx of schizophrenia , who presented to [facility] ED after being petitioned for involuntary commitment by his mother for psychosis. Pt thereafter was admitted to [facility] for further management.  Patient seen and chart reviewed.Discussed patient with treatment team.  Pt today continues to be mute , does not respond to questions asked. Per staff he is withdrawn , mute , however is compliant in medications as well as observed as taking his meals and sleeping well. Will continue to encourage and support.     Principal Problem: Schizophrenia, paranoid type ([place]) Diagnosis:   Patient Active Problem List   Diagnosis Date Noted  . Schizophrenia, paranoid type ([place]) [F20.0] 07/18/2015  . Acute psychosis [F29]   . 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 Hx of epistaxis [Z87.09] 02/01/2015  . Vitamin B12 deficiency [E53.8] 01/22/2015  . 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 No significant past medical history [IMO0001]    Total Time spent with patient: 25 minutes  Past Psychiatric History: Please see H&P.   Past Medical History:  Past Medical History  Diagnosis Date  . No significant past medical history   . Homelessness   . Abnormal behavior    History reviewed. No pertinent past surgical history. Family History:  Family History  Problem Relation Age of Onset  . Depression Father   . Bipolar disorder Father   . Alcohol abuse Father    Family Psychiatric  History: Please see H&P.  Social History:  History  Alcohol Use No     History  Drug Use Not on file    Social History   Social History  . Marital Status: Single    Spouse Name: N/A  . Number of Children: N/A  . Years of Education: N/A   Social History Main Topics  . Smoking status: Unknown If Ever Smoked  . Smokeless tobacco: None  . Alcohol Use: No  .  Drug Use: None  . Sexual Activity: Not Asked   Other Topics Concern  . None   Social History Narrative   ** Merged History Encounter **       Additional Social History:                         Sleep: Fair  Appetite:  Fair  Current Medications: Current Facility-Administered Medications  Medication Dose Route Frequency Provider Last Rate Last Dose  . benztropine (COGENTIN) tablet 1 mg  1 mg Oral BID Laverle Hobby, PA-C   1 mg at 07/20/15 0809  . citalopram (CELEXA) tablet 20 mg  20 mg Oral Daily Laverle Hobby, PA-C   20 mg at 07/20/15 7654  . cyanocobalamin ((VITAMIN B-12)) injection 100 mcg  100 mcg Subcutaneous Daily Obie Silos, MD      . haloperidol (HALDOL) tablet 10 mg  10 mg Oral BID Laverle Hobby, PA-C   10 mg at 07/20/15 6503  . [START ON 08/05/2015] haloperidol decanoate (HALDOL DECANOATE) 100 MG/ML injection 100 mg  100 mg Intramuscular Q28 days Rodrecus Belsky, MD      . LORazepam (ATIVAN) tablet 1 mg  1 mg Oral Q6H PRN Ursula Alert, MD       Or  . LORazepam (ATIVAN) injection 1 mg  1 mg Intramuscular Q6H PRN Ursula Alert, MD      .  LORazepam (ATIVAN) tablet 1 mg  1 mg Oral BID Laverle Hobby, PA-C   1 mg at 07/20/15 0809  . traZODone (DESYREL) tablet 100 mg  100 mg Oral QHS Laverle Hobby, PA-C   100 mg at 07/19/15 2215  . ziprasidone (GEODON) capsule 20 mg  20 mg Oral BID PRN Ursula Alert, MD       Or  . ziprasidone (GEODON) injection 10 mg  10 mg Intramuscular BID PRN Ursula Alert, MD        Lab Results:  Results for orders placed or performed during the hospital encounter of 07/18/15 (from the past 48 hour(s))  TSH     Status: None   Collection Time: 07/19/15  6:33 AM  Result Value Ref Range   TSH 0.413 0.350 - 4.500 uIU/mL    Comment: Performed at [facility]  Hemoglobin A1c     Status: Abnormal   Collection Time: 07/19/15  6:33 AM  Result Value Ref Range   Hgb A1c MFr Bld 4.7 (L) 4.8 - 5.6 %    Comment:  (NOTE)         Pre-diabetes: 5.7 - 6.4         Diabetes: >6.4         Glycemic control for adults with diabetes: <7.0    Mean Plasma Glucose 88 mg/dL    Comment: (NOTE) Performed At: [facility] [place], [place] [id number] Lindon Romp MD IN:[id number] Performed at [facility]   Lipid panel     Status: Abnormal   Collection Time: 07/19/15  6:33 AM  Result Value Ref Range   Cholesterol 167 0 - 200 mg/dL   Triglycerides 60 <150 mg/dL   HDL 42 >40 mg/dL   Total CHOL/HDL Ratio 4.0 RATIO   VLDL 12 0 - 40 mg/dL   LDL Cholesterol 113 (H) 0 - 99 mg/dL    Comment:        Total Cholesterol/HDL:CHD Risk Coronary Heart Disease Risk Table                     Men   Women  1/2 Average Risk   3.4   3.3  Average Risk       5.0   4.4  2 X Average Risk   9.6   7.1  3 X Average Risk  23.4   11.0        Use the calculated Patient Ratio above and the CHD Risk Table to determine the patient's CHD Risk.        ATP III CLASSIFICATION (LDL):  <100     mg/dL   Optimal  100-129  mg/dL   Near or Above                    Optimal  130-159  mg/dL   Borderline  160-189  mg/dL   High  >190     mg/dL   Very High Performed at [place] metabolic panel     Status: None   Collection Time: 07/19/15  6:33 AM  Result Value Ref Range   Sodium 139 135 - 145 mmol/L   Potassium 4.1 3.5 - 5.1 mmol/L   Chloride 103 101 - 111 mmol/L   CO2 30 22 - 32 mmol/L   Glucose, Bld 93 65 - 99 mg/dL   BUN 18 6 - 20 mg/dL   Creatinine, Ser 1.11 0.61 -  1.24 mg/dL   Calcium 9.5 8.9 - 10.3 mg/dL   GFR calc non Af Amer >60 >60 mL/min   GFR calc Af Amer >60 >60 mL/min    Comment: (NOTE) The eGFR has been calculated using the CKD EPI equation. This calculation has not been validated in all clinical situations. eGFR's persistently <60 mL/min signify possible Chronic Kidney Disease.    Anion gap 6 5 - 15    Comment: Performed at [facility]  Vitamin B12     Status: Abnormal   Collection Time: 07/19/15 12:00 PM  Result Value Ref Range   Vitamin B-12 172 (L) 180 - 914 pg/mL    Comment: (NOTE) This assay is not validated for testing neonatal or myeloproliferative syndrome specimens for Vitamin B12 levels. Performed at [facility]     Blood Alcohol level:  Lab Results  Component Value Date   [facility] <5 07/08/2015   ETH <5 01/19/2015    Physical Findings: AIMS: Facial and Oral Movements Muscles of Facial Expression: None, normal Lips and Perioral Area: None, normal Jaw: None, normal Tongue: None, normal,Extremity Movements Upper (arms, wrists, hands, fingers): None, normal Lower (legs, knees, ankles, toes): None, normal, Trunk Movements Neck, shoulders, hips: None, normal, Overall Severity Severity of abnormal movements (highest score from questions above): None, normal Incapacitation due to abnormal movements: None, normal Patient's awareness of abnormal movements (rate only patient's report): No Awareness, Dental Status Current problems with teeth and/or dentures?: No Does patient usually wear dentures?: No  CIWA:  CIWA-Ar Total: 0 COWS:     Musculoskeletal: Strength & Muscle Tone: within normal limits Gait & Station: normal Patient leans: N/A  Psychiatric Specialty Exam: Physical Exam  Nursing note and vitals reviewed.   Review of Systems  Unable to perform ROS: mental acuity    Blood pressure 87/55, pulse 72, temperature 98.2 F (36.8 C), temperature source Oral, resp. rate 20.There is no weight on file to calculate BMI.  General Appearance: Guarded  Eye Contact:  Poor  Speech:  mute  Volume:  mute  Mood:  mute  Affect:  Flat  Thought Process:  NA is mute  Orientation:  Other:  to self  Thought Content:  seen as responding to internal stimuli, appears paranoid  Suicidal Thoughts:  did not express any  Homicidal Thoughts:  did not express any  Memory:  unable to assess  Judgement:   Poor  Insight:  Lacking  Psychomotor Activity:  Decreased  Concentration:  Concentration: Poor and Attention Span: Poor  Recall:  Poor  Fund of Knowledge:  Poor  Language:  Poor  Akathisia:  No  Handed:  Right  AIMS (if indicated):     Assets:  Social Support  ADL's:  Intact  Cognition:  WNL  Sleep:  Number of Hours: 6.5   07/19/15 Collateral information was obtained from mother - Arcola Jansky - cell [phone]/ work [phone]- according to mother - pt stopped taking his medications the last time he was discharged , was living with grandmother for a while , then mother went and picked him up. Pt usually stays at home , and at baseline is talkative - not mute . Per mother he could have been abusing alcohol which could have contributed to his decompensation. Mother is willing to pick him up on discharge.      Treatment Plan Summary:Siddhant E Lonzo is a 25 y.o. male who has a hx of schizophrenia , who presented to [facility] ED after being petitioned  for involuntary commitment by his mother for psychosis. Pt thereafter was admitted to [facility] for further management.Pt continues to be mute , will continue treatment.Pt also with Vitamin b12 deficiency - will replace .  Daily contact with patient to assess and evaluate symptoms and progress in treatment and Medication management Reviewed past medical records,treatment plan.  Will continue Haldol 10 mg po bid for psychosis.Haldol decanoate 100 mg IM - q28 days - last dose 07/10/15. Will continue Cogentin 1 mg po bid for EPS. Will continue Ativan 1 mg po bid for catatonic sx. Will increase Celexa to 40 mg po daily for affective sx. Will make available prn medications as per agitation protocol. Will continue to monitor vitals ,medication compliance and treatment side effects while patient is here.  Will monitor for medical issues as well as call consult as needed.  Reviewed labs CBC, CMP - WNL , UDS- Negative , BAL<5 , EKG- qtc - wnl  ,TSH- wnl . Pt with hx of Vitamin b12 deficiency - [facility]OCA last admission -02/01/2015 - 23/30 . Repeat Vitamin b12 - LOW - 172- Will start Vitamin b12 100 mcg SQ x 7 days .  CSW will continue working on disposition.  Patient to participate in therapeutic milieu .  Amaura Authier, MD 07/20/2015, 4:11 PM

## 2015-07-21 MED ORDER — ARIPIPRAZOLE 5 MG PO TABS
5.0000 mg | ORAL_TABLET | Freq: Every day | ORAL | Status: DC
  Administered 2015-07-21 – 2015-07-23 (×3): 5 mg via ORAL
  Filled 2015-07-21 (×5): qty 1

## 2015-07-21 MED ORDER — CYANOCOBALAMIN 1000 MCG/ML IJ SOLN
1000.0000 ug | Freq: Every day | INTRAMUSCULAR | Status: DC
  Administered 2015-07-21 – 2015-07-26 (×6): 1000 ug via SUBCUTANEOUS
  Filled 2015-07-21 (×7): qty 1

## 2015-07-21 MED ORDER — HALOPERIDOL 5 MG PO TABS: 5.0000 mg | ORAL_TABLET | Freq: Two times a day (BID) | ORAL | Status: DC

## 2015-07-21 NOTE — Progress Notes (Signed)
D: Pt alert, remains selectively mute. Noded his head in disagreement when assessed for SI, HI, AVH and pain at medication window this AM. Reported he's sleeping good, with poor appetite, poor concentration level and high energy level on self inventory sheet. Rated his depression, anxiety and hopelessness all 0/10 on self inventory sheet.  A: Scheduled medications administered as prescribed. Verbal encouragement offered to pt towards treatment compliance including groups attendance. Emotional support and availability provided to pt. Q 15 minutes checks maintained on and off unit for safety without incident thus far this shift.  R: Pt receptive to care. Pt went to cafeteria for meals with peers, returned without issues. Pt did not attended scheduled AM group but attended noon group on unit and per CSW he was verbally engaged and logical. Compliant with medications when offered PO. Remains safe on and off unit. Continue POC.

## 2015-07-21 NOTE — [facility] Group Notes (Signed)
[facility] Group Notes:  (Nursing/MHT/Case Management/Adjunct)  Date:  07/21/2015  Time:  2:32 PM  Type of Therapy:  Nurse Education  Participation Level:  Did Not Attend    Justin [name] 07/21/2015, 2:32 PM

## 2015-07-21 NOTE — Progress Notes (Signed)
D:Patient in his room in bed on approach.  Patient has been electively mute tonight but did nod his head yes and no.  Patient appears paranoid and suspicious.  Patient did take snacks from writer and he ate them. Patient denies SI/HI and denies AVH. A: Staff to monitor Q 15 mins for safety.  Encouragement and support offered.  Scheduled medications administered per orders. R: Patient remains safe on the unit.  Patient did not attend group tonight.  Patient not visible on the unit tonight.  Patient taking administered medications.

## 2015-07-21 NOTE — Progress Notes (Signed)
Pt did not attend karaoke group.  

## 2015-07-21 NOTE — [facility] Group Notes (Signed)
[facility] Group Notes:  (Counselor/Nursing/MHT/Case Management/Adjunct)  07/21/2015 1:15PM  Type of Therapy:  Group Therapy  Participation Level:  Active  Participation Quality:  Appropriate  Affect:  Flat  Cognitive:  Oriented  Insight:  Improving  Engagement in Group:  Limited  Engagement in Therapy:  Limited  Modes of Intervention:  Discussion, Exploration and Socialization  Summary of Progress/Problems: The topic for group was balance in life.  Pt participated in the discussion about when their life was in balance and out of balance and how this feels.  Pt discussed ways to get back in balance and short term goals they can work on to get where they want to be.  Stayed the entire time, engaged throughout.  Did not contribute spontaneously, but responded appropriately when addressed directly.  Stated he feels balanced today.  When asked how he knows, stated it is because he feels calm and relaxed.  Also identified coming to the hospital as something that helps him find balance.  Daryel Geraldorth, Dariel Betzer B 07/21/2015 4:00 PM

## 2015-07-21 NOTE — Progress Notes (Signed)
Pt has been nonverbal tonight.  He will not respond to staff.  Pt took HS medication.  He was asked to notify staff if he has thoughts of harming himself or others.  Pt is in his room resting.  Will continue to monitor and assess.

## 2015-07-21 NOTE — Progress Notes (Signed)
[facility]  07/21/2015 12:33 PM Justin BartersRobert E [name]  MRN:  [id number] Subjective: Pt is mute.  Objective::Justin [name] is a 25 y.o. male who has a hx of schizophrenia , who presented to [facility] after being petitioned for involuntary commitment by his mother for psychosis. [facility].  Patient seen and chart reviewed.Discussed patient with treatment team.  Pt today continues to be mute , does not respond to questions asked. Per staff he is withdrawn , mute , however is compliant in medications as well as observed as taking his meals and sleeping well. Will continue to encourage and support.     Principal Problem: Schizophrenia, paranoid type ([facility]) Diagnosis:   Patient Active Problem List   Diagnosis Date Noted  . 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 Acute psychosis [F29]   . 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 Hx of epistaxis [Z87.09] 02/01/2015  . Vitamin B12 deficiency [E53.8] 01/22/2015  . 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 No significant past medical history [IMO0001]    Total Time spent with patient: 25 minutes  Past Psychiatric History: Please see H&P.   Past Medical History:  Past Medical History  Diagnosis Date  . No significant past medical history   . Homelessness   . Abnormal behavior    History reviewed. No pertinent past surgical history. Family History:  Family History  Problem Relation Age of Onset  . Depression Father   . Bipolar disorder Father   . Alcohol abuse Father    Family Psychiatric  History: Please see H&P.  Social History:  History  Alcohol Use No     History  Drug Use Not on file    Social History   Social History  . Marital Status: Single    Spouse Name: N/A  . Number of Children: N/A  . Years of Education: N/A   Social History Main Topics  . Smoking status: Smoker, Current Status Unknown  . Smokeless tobacco: None  . Alcohol Use:  No  . Drug Use: None  . Sexual Activity: Not Asked   Other Topics Concern  . None   Social History Narrative   ** Merged History Encounter **       Additional Social History:                         Sleep: Fair  Appetite:  Fair  Current Medications: Current Facility-Administered Medications  Medication Dose Route Frequency Provider Last Rate Last Dose  . ARIPiprazole (ABILIFY) tablet 5 mg  5 mg Oral QHS Justin Venier, MD      . benztropine (COGENTIN) tablet 1 mg  1 mg Oral BID Justin HoughSpencer E Simon, PA-C   1 mg at 07/21/15 [id number]  . citalopram (CELEXA) tablet 40 mg  40 mg Oral Daily Justin LongsSaramma Carley Glendenning, MD   40 mg at 07/21/15 0832  . cyanocobalamin ((VITAMIN B-12)) injection 1,000 mcg  1,000 mcg Subcutaneous Daily Justin LongsSaramma Lashawnta Burgert, MD   1,000 mcg at 07/21/15 1028  . [START ON 08/05/2015] haloperidol decanoate (HALDOL DECANOATE) 100 MG/ML injection 100 mg  100 mg Intramuscular Q28 days Justin Mas, MD      . LORazepam (ATIVAN) tablet 1 mg  1 mg Oral Q6H PRN Justin LongsSaramma Roanne Haye, MD       Or  . LORazepam (ATIVAN) injection 1 mg  1 mg Intramuscular Q6H PRN Justin LongsSaramma Kaeli Nichelson, MD      .  LORazepam (ATIVAN) tablet 1 mg  1 mg Oral BID Justin Hough, PA-C   1 mg at 07/21/15 1610  . traZODone (DESYREL) tablet 100 mg  100 mg Oral QHS Justin Hough, PA-C   100 mg at 07/20/15 2112  . ziprasidone (GEODON) capsule 20 mg  20 mg Oral BID PRN Justin Longs, MD       Or  . ziprasidone (GEODON) injection 10 mg  10 mg Intramuscular BID PRN Justin Longs, MD        Lab Results:  No results found for this or any previous visit (from the past 48 hour(s)).  Blood Alcohol level:  Lab Results  Component Value Date   ETH <5 07/08/2015   ETH <5 01/19/2015    Physical Findings: AIMS: Facial and Oral Movements Muscles of Facial Expression: None, normal Lips and Perioral Area: None, normal Jaw: None, normal Tongue: None, normal,Extremity Movements Upper (arms, wrists, hands, fingers): None,  normal Lower (legs, knees, ankles, toes): None, normal, Trunk Movements Neck, shoulders, hips: None, normal, Overall Severity Severity of abnormal movements (highest score from questions above): None, normal Incapacitation due to abnormal movements: None, normal Patient's awareness of abnormal movements (rate only patient's report): No Awareness, Dental Status Current problems with teeth and/or dentures?: No Does patient usually wear dentures?: No  CIWA:  CIWA-Ar Total: 0 COWS:     Musculoskeletal: Strength & Muscle Tone: within normal limits Gait & Station: normal Patient leans: N/A  Psychiatric Specialty Exam: Physical Exam  Nursing note and vitals reviewed.   Review of Systems  Unable to perform ROS: mental acuity    Blood pressure 109/66, pulse 87, temperature 98.1 F (36.7 C), temperature source Oral, resp. rate 20.There is no weight on file to calculate BMI.  General Appearance: Guarded  Eye Contact:  Poor  Speech:  mute  Volume:  mute  Mood:  mute  Affect:  Flat  Thought Process:  NA is mute  Orientation:  Other:  to self  Thought Content:  seen as responding to internal stimuli, appears paranoid  Suicidal Thoughts:  did not express any  Homicidal Thoughts:  did not express any  Memory:  unable to assess  Judgement:  Poor  Insight:  Lacking  Psychomotor Activity:  Decreased  Concentration:  Concentration: Poor and Attention Span: Poor  Recall:  Poor  Fund of Knowledge:  Poor  Language:  Poor  Akathisia:  No  Handed:  Right  AIMS (if indicated):     Assets:  Social Support  ADL's:  Intact  Cognition:  WNL  Sleep:  Number of Hours: 6.75   07/19/15 Collateral information was obtained from mother - Justin [name] - cell [phone]/ work [phone]- according to mother - pt stopped taking his medications the last time he was discharged , was living with grandmother for a while , then mother went and picked him up. Pt usually stays at home , and at baseline is  talkative - not mute . Per mother he could have been abusing alcohol which could have contributed to his decompensation. Mother is willing to pick him up on discharge.      Treatment Plan Summary:Justin [name] is a 25 y.o. male who has a hx of schizophrenia , who presented to [facility] ED after being petitioned for involuntary commitment by his mother for psychosis. Pt thereafter was admitted to [facility] for further management.Pt continues to be mute , will continue treatment.Pt also with Vitamin b12 deficiency - will continue to  replace .  Daily contact with patient to assess and evaluate symptoms and progress in treatment and Medication management Reviewed past medical records,treatment plan.  Will continue Haldol decanoate 100 mg IM - q28 days - last dose 07/10/15. Will discontinue Haldol PO , Will start Abilify 5 mg po daily for psychosis- to augment the effect of Haldol dec. Will continue Cogentin 1 mg po bid for EPS. Will continue Ativan 1 mg po bid for catatonic sx. Increased Celexa to 40 mg po daily for affective sx. Will make available prn medications as per agitation protocol. Will continue to monitor vitals ,medication compliance and treatment side effects while patient is here.  Will monitor for medical issues as well as call consult as needed.  Reviewed labs CBC, CMP - WNL , UDS- Negative , BAL<5 , EKG- qtc - wnl ,TSH- wnl . Pt with hx of Vitamin b12 deficiency - [facility]A last admission -02/01/2015 - 23/30 . Repeat Vitamin b12 - LOW - 172- Will start Vitamin b12 1000 mcg SQ x 7 days . Will change the dose after that - could change to PO on discharge.  CSW will continue working on disposition.  Patient to participate in therapeutic milieu .  Eleanora Guinyard, MD 07/21/2015, 12:33 PM

## 2015-07-22 NOTE — Progress Notes (Signed)
[facility] MD Progress Note  07/22/2015 10:04 AM Justin [name]  MRN:  [id number] Subjective: Pt is mute.  Objective::Justin [name] is a 25 y.o. male who has a hx of schizophrenia , who presented to [facility] after being petitioned for involuntary commitment by his mother for psychosis. [facility].  Patient seen and chart reviewed.Discussed patient with treatment team.  Pt today continues to be mute- no response to any questions asked . However pt is observed as taking his medications when offered as well as taking his meals . Pt continues to have no eye contact and continues to ignore Clinical research associatewriter when Clinical research associatewriter attempts to evaluate him. Per staff he is withdrawn ,but is able to respond minimally when questions asked.  Will continue to encourage and support.     Principal Problem: Schizophrenia, paranoid type (HCC) Diagnosis:   Patient Active Problem List   Diagnosis Date Noted  . 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 Acute psychosis [F29]   . 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 Hx of epistaxis [Z87.09] 02/01/2015  . Vitamin B12 deficiency [E53.8] 01/22/2015  . 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 No significant past medical history [IMO0001]    Total Time spent with patient: 25 minutes  Past Psychiatric History: Please see H&P.   Past Medical History:  Past Medical History  Diagnosis Date  . No significant past medical history   . Homelessness   . Abnormal behavior    History reviewed. No pertinent past surgical history. Family History:  Family History  Problem Relation Age of Onset  . Depression Father   . Bipolar disorder Father   . Alcohol abuse Father    Family Psychiatric  History: Please see H&P.  Social History:  History  Alcohol Use No     History  Drug Use Not on file    Social History   Social History  . Marital Status: Single    Spouse Name: N/A  . Number of Children: N/A  .  Years of Education: N/A   Social History Main Topics  . Smoking status: Smoker, Current Status Unknown  . Smokeless tobacco: None  . Alcohol Use: No  . Drug Use: None  . Sexual Activity: Not Asked   Other Topics Concern  . None   Social History Narrative   ** Merged History Encounter **       Additional Social History:                         Sleep: Fair  Appetite:  Fair  Current Medications: Current Facility-Administered Medications  Medication Dose Route Frequency Provider Last Rate Last Dose  . ARIPiprazole (ABILIFY) tablet 5 mg  5 mg Oral QHS Jomarie LongsSaramma Nohea Kras, MD   5 mg at 07/21/15 2239  . benztropine (COGENTIN) tablet 1 mg  1 mg Oral BID Kerry HoughSpencer E Simon, PA-C   1 mg at 07/22/15 0810  . citalopram (CELEXA) tablet 40 mg  40 mg Oral Daily Jomarie LongsSaramma Brixon Zhen, MD   40 mg at 07/22/15 0810  . cyanocobalamin ((VITAMIN B-12)) injection 1,000 mcg  1,000 mcg Subcutaneous Daily Jomarie LongsSaramma Jomar Denz, MD   1,000 mcg at 07/22/15 0811  . [START ON 08/05/2015] haloperidol decanoate (HALDOL DECANOATE) 100 MG/ML injection 100 mg  100 mg Intramuscular Q28 days Elijan Googe, MD      . LORazepam (ATIVAN) tablet 1 mg  1 mg Oral Q6H PRN Jomarie LongsSaramma Shandi Godfrey, MD  Or  . LORazepam (ATIVAN) injection 1 mg  1 mg Intramuscular Q6H PRN Jomarie Longs, MD      . LORazepam (ATIVAN) tablet 1 mg  1 mg Oral BID Kerry Hough, PA-C   1 mg at 07/22/15 0810  . traZODone (DESYREL) tablet 100 mg  100 mg Oral QHS Kerry Hough, PA-C   100 mg at 07/21/15 2239  . ziprasidone (GEODON) capsule 20 mg  20 mg Oral BID PRN Jomarie Longs, MD       Or  . ziprasidone (GEODON) injection 10 mg  10 mg Intramuscular BID PRN Jomarie Longs, MD        Lab Results:  No results found for this or any previous visit (from the past 48 hour(s)).  Blood Alcohol level:  Lab Results  Component Value Date   ETH <5 07/08/2015   ETH <5 01/19/2015    Physical Findings: AIMS: Facial and Oral Movements Muscles of Facial  Expression: None, normal Lips and Perioral Area: None, normal Jaw: None, normal Tongue: None, normal,Extremity Movements Upper (arms, wrists, hands, fingers): None, normal Lower (legs, knees, ankles, toes): None, normal, Trunk Movements Neck, shoulders, hips: None, normal, Overall Severity Severity of abnormal movements (highest score from questions above): None, normal Incapacitation due to abnormal movements: None, normal Patient's awareness of abnormal movements (rate only patient's report): No Awareness, Dental Status Current problems with teeth and/or dentures?: No Does patient usually wear dentures?: No  CIWA:  CIWA-Ar Total: 0 COWS:     Musculoskeletal: Strength & Muscle Tone: within normal limits Gait & Station: normal Patient leans: N/A  Psychiatric Specialty Exam: Physical Exam  Nursing note and vitals reviewed.   Review of Systems  Unable to perform ROS: mental acuity    Blood pressure 91/51, pulse 104, temperature 98 F (36.7 C), temperature source Oral, resp. rate 16.There is no weight on file to calculate BMI.  General Appearance: Guarded  Eye Contact:  Poor  Speech:  mute  Volume:  mute  Mood:  mute  Affect:  Flat  Thought Process:  NA is mute  Orientation:  Other:  to self  Thought Content:  seen as responding to internal stimuli, appears paranoid  Suicidal Thoughts:  did not express any  Homicidal Thoughts:  did not express any  Memory:  unable to assess  Judgement:  Poor  Insight:  Lacking  Psychomotor Activity:  Decreased  Concentration:  Concentration: Poor and Attention Span: Poor  Recall:  Poor  Fund of Knowledge:  Poor  Language:  Poor  Akathisia:  No  Handed:  Right  AIMS (if indicated):     Assets:  Social Support  ADL's:  Intact  Cognition:  WNL  Sleep:  Number of Hours: 4.25   07/19/15 Collateral information was obtained from mother - Justin [name] - cell [phone]/ work [phone]- according to mother - pt stopped taking his  medications the last time he was discharged , was living with grandmother for a while , then mother went and picked him up. Pt usually stays at home , and at baseline is talkative - not mute . Per mother he could have been abusing alcohol which could have contributed to his decompensation. Mother is willing to pick him up on discharge.      Treatment Plan Summary:Justin [name] is a 25 y.o. male who has a hx of schizophrenia , who presented to [facility] ED after being petitioned for involuntary commitment by his mother for psychosis. [facility].Pt continues to be mute , will continue treatment.Pt also with Vitamin b12 deficiency - will continue to  replace .  Daily contact with patient to assess and evaluate symptoms and progress in treatment and Medication management Reviewed past medical records,treatment plan.  Will continue Haldol decanoate 100 mg IM - q28 days - last dose 07/10/15. Will continue  Abilify 5 mg po daily for psychosis- to augment the effect of Haldol dec. Will continue Cogentin 1 mg po bid for EPS. Will continue Ativan 1 mg po bid for catatonic sx. Increased Celexa to 40 mg po daily for affective sx. Will make available prn medications as per agitation protocol. Will continue to monitor vitals ,medication compliance and treatment side effects while patient is here.  Will monitor for medical issues as well as call consult as needed.  Reviewed labs CBC, CMP - WNL , UDS- Negative , BAL<5 , EKG- qtc - wnl ,TSH- wnl . Pt with hx of Vitamin b12 deficiency - [facility] last admission -02/01/2015 - 23/30 . Repeat Vitamin b12 - LOW - 172- Will continue  Vitamin b12 1000 mcg SQ x 7 days . Will change the dose after that - could change to PO on discharge.  CSW will continue working on disposition.  Patient to participate in therapeutic milieu .  Kynzli Rease, MD 07/22/2015, 10:04 AM

## 2015-07-22 NOTE — Progress Notes (Signed)
Psychoeducational Group Note  Date:  07/22/2015 Time:  2105  Group Topic/Focus:  Wrap-Up Group:   The focus of this group is to help patients review their daily goal of treatment and discuss progress on daily workbooks.  Participation Level: Did Not Attend  Participation Quality:  Not Applicable  Affect:  Not Applicable  Cognitive:  Not Applicable  Insight:  Not Applicable  Engagement in Group: Not Applicable  Additional Comments:  The patient did not attend group this evening.   Hazle CocaGOODMAN, Letcher Schweikert S 07/22/2015, 9:05 PM

## 2015-07-22 NOTE — Progress Notes (Signed)
Patient ID: Hermina BartersRobert E Pavao, male   DOB: 04/01/1990, 25 y.o.   MRN: [id number]  D: Patient has a flat irritable affect on approach this am. Doesn't talk directly with undersigned thus far but I haven't worked with him since admission. Did fill out self inventory sheet. Reporting that he slept fair and his appetite was fair. He rates depression, hopelessness, and anxiety at "0". Currently denies any SI. Unsure of his reasoning for not speaking to staff when trying to ask him questions (angry about being here versus psychosis).  A: Staff will monitor on q 15 minute checks, follow treatment plan, and give meds as ordered. R: Cooperative with taking PO medications including the B12 injection.

## 2015-07-22 NOTE — [facility] Group Notes (Signed)
[facility] LCSW Group Therapy  07/22/2015  1:05 PM  Type of Therapy:  Group therapy  Participation Level:  Active  Participation Quality:  Attentive  Affect:  Flat  Cognitive:  Oriented  Insight:  Limited  Engagement in Therapy:  Limited  Modes of Intervention:  Discussion, Socialization  Summary of Progress/Problems:  Chaplain was here to lead a group on themes of hope and courage. Stayed the entire time, engaged throughout.  Contributed when asked directly.  Stated he feels hopeful when he is able to go for walks outside, and when he is working.  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  He corrected the leader that his work is not about feeling productive nor useful, it's about getting money.  "Money makes the world go round."  Ida Rogueorth, Greer Koeppen B 07/22/2015 2:41 PM

## 2015-07-23 NOTE — [facility] Group Notes (Signed)
[facility] Group Notes:  (Nursing/MHT/Case Management/Adjunct)  Date:  07/23/2015  Time:  3:04 PM  Type of Therapy:  Psychoeducational Skills  Participation Level:  Minimal  Participation Quality:  Resistant  Affect:  Flat  Cognitive:  Lacking  Insight:  Lacking  Engagement in Group:  None  Modes of Intervention:  Problem-solving  Summary of Progress/Problems: Pt did attend self inventory group.     Jacquelyne BalintForrest, Jacinto Keil Shanta 07/23/2015, 3:04 PM

## 2015-07-23 NOTE — [facility] Group Notes (Signed)
[facility] Group Notes:  (Clinical Social Work)  07/23/2015  11:15-12:00PM  Summary of Progress/Problems:   Today's process group involved patients discussing their feelings related to being hospitalized, as well as how they can use their present feelings to create a word which can be their focus for the day. The patient expressed nothing about his primary feeling about being hospitalized, as he remained mute throughout group, with a limited amount of nodding or shaking his head to answer CSW questions.  His word was chosen by CSW and he acknowledged with a nod that he understood, "Community."  Type of Therapy:  Group Therapy - Process  Participation Level:  Minimal  Participation Quality:  Attentive  Affect:  Flat  Cognitive:  Lacking  Insight:  Lacking  Engagement in Therapy:  Poor  Modes of Intervention:  Exploration, Discussion  Ambrose MantleMareida Grossman-Orr, LCSW 07/23/2015, 4:33 PM

## 2015-07-23 NOTE — Progress Notes (Signed)
Patient ID: Justin BartersRobert E [name], male   DOB: 02-Jan-1991, 25 y.o.   MRN: [id number]    D: Pt has been very flat and depressed on the unit today. He remained to himself and did not interact a lot with peers or staff. Pt did take all medications as prescribed by the doctor, no issues or concerns noted. Pt reported that his depression was a 0, his hopelessness was a 0, and his anxiety was a 2. Pt reported that his goal for today was 0. Pt reported being negative SI/HI, no AH/VH noted. A: 15 min checks continued for patient safety. R: Pt safety maintained.

## 2015-07-23 NOTE — Progress Notes (Signed)
D: Pt isolative and withdrawn to his room; was in bed for most of the evening. Pt refused to answer any assessment questions (elective mutism). Pt did not look to be in any acute distress. A: Medications offered as prescribed.  Support, encouragement, and safe environment provided.  15-minute safety checks continue. R: Pt was med compliant.  Pt did not attend wrap-up group. Safety checks continue. 

## 2015-07-23 NOTE — Progress Notes (Signed)
[name] Progress Note  07/23/2015 1:59 PM Justin [name]  MRN:  [id number]   Subjective: Pt is continue to be mute today when asked a few questions his responses nothing.  Objective: Justin [name] is a 25 y.o. male who has a hx of schizophrenia, noncompliant with medication admitted involuntarily by petitioned from his mother for increased symptoms of psychosis. Patient has been isolating, seems to be preoccupied his own thoughts and ruminations and not able to socialize or communicate with the staff members or peer group. Patient does follow the instructions given to him and sometimes needed frequent redirection's.   Patient has been taking his medications when offered as well as taking his meals he does not have any extrapyramidal symptoms or others adverse effect of the medication. Pt continues to have no eye contact and continues to ignore Clinical research associate when Clinical research associate attempts to evaluate him.Will continue to encourage and support.  Principal Problem: Schizophrenia, paranoid type (HCC) Diagnosis:   Patient Active Problem List   Diagnosis Date Noted  . 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 Acute psychosis [F29]   . 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 Hx of epistaxis [Z87.09] 02/01/2015  . Vitamin B12 deficiency [E53.8] 01/22/2015  . 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 No significant past medical history [IMO0001]    Total Time spent with patient: 25 minutes  Past Psychiatric History: Please see H&P.   Past Medical History:  Past Medical History  Diagnosis Date  . No significant past medical history   . Homelessness   . Abnormal behavior    History reviewed. No pertinent past surgical history. Family History:  Family History  Problem Relation Age of Onset  . Depression Father   . Bipolar disorder Father   . Alcohol abuse Father    Family Psychiatric  History: Please see H&P.  Social History:  History  Alcohol Use No     History  Drug Use Not on  file    Social History   Social History  . Marital Status: Single    Spouse Name: N/A  . Number of Children: N/A  . Years of Education: N/A   Social History Main Topics  . Smoking status: Smoker, Current Status Unknown  . Smokeless tobacco: None  . Alcohol Use: No  . Drug Use: None  . Sexual Activity: Not Asked   Other Topics Concern  . None   Social History Narrative   ** Merged History Encounter **       Additional Social History:       Sleep: Fair  Appetite:  Fair  Current Medications: Current Facility-Administered Medications  Medication Dose Route Frequency Provider Last Rate Last Dose  . ARIPiprazole (ABILIFY) tablet 5 mg  5 mg Oral QHS Jomarie Longs, MD   5 mg at 07/22/15 2129  . benztropine (COGENTIN) tablet 1 mg  1 mg Oral BID Kerry Hough, PA-C   1 mg at 07/23/15 0901  . citalopram (CELEXA) tablet 40 mg  40 mg Oral Daily Jomarie Longs, MD   40 mg at 07/23/15 0901  . cyanocobalamin ((VITAMIN B-12)) injection 1,000 mcg  1,000 mcg Subcutaneous Daily Jomarie Longs, MD   1,000 mcg at 07/23/15 0901  . [START ON 08/05/2015] haloperidol decanoate (HALDOL DECANOATE) 100 MG/ML injection 100 mg  100 mg Intramuscular Q28 days Saramma Eappen, MD      . LORazepam (ATIVAN) tablet 1 mg  1 mg Oral Q6H PRN Jomarie Longs, MD  Or  . LORazepam (ATIVAN) injection 1 mg  1 mg Intramuscular Q6H PRN Jomarie LongsSaramma Eappen, MD      . LORazepam (ATIVAN) tablet 1 mg  1 mg Oral BID Kerry HoughSpencer E Simon, PA-C   1 mg at 07/23/15 0901  . traZODone (DESYREL) tablet 100 mg  100 mg Oral QHS Kerry HoughSpencer E Simon, PA-C   100 mg at 07/22/15 2129  . ziprasidone (GEODON) capsule 20 mg  20 mg Oral BID PRN Jomarie LongsSaramma Eappen, MD       Or  . ziprasidone (GEODON) injection 10 mg  10 mg Intramuscular BID PRN Jomarie LongsSaramma Eappen, MD        Lab Results:  No results found for this or any previous visit (from the past 48 hour(s)).  Blood Alcohol level:  Lab Results  Component Value Date   ETH <5 07/08/2015   ETH <5  01/19/2015    Physical Findings: AIMS: Facial and Oral Movements Muscles of Facial Expression: None, normal Lips and Perioral Area: None, normal Jaw: None, normal Tongue: None, normal,Extremity Movements Upper (arms, wrists, hands, fingers): None, normal Lower (legs, knees, ankles, toes): None, normal, Trunk Movements Neck, shoulders, hips: None, normal, Overall Severity Severity of abnormal movements (highest score from questions above): None, normal Incapacitation due to abnormal movements: None, normal Patient's awareness of abnormal movements (rate only patient's report): No Awareness, Dental Status Current problems with teeth and/or dentures?: No Does patient usually wear dentures?: No  CIWA:  CIWA-Ar Total: 0 COWS:     Musculoskeletal: Strength & Muscle Tone: within normal limits Gait & Station: normal Patient leans: N/A  Psychiatric Specialty Exam: Physical Exam  Nursing note and vitals reviewed.   Review of Systems  Unable to perform ROS: mental acuity    Blood pressure 85/47, pulse 91, temperature 97.7 F (36.5 C), temperature source Oral, resp. rate 18.There is no weight on file to calculate BMI.  General Appearance: Guarded  Eye Contact:  Poor  Speech:  mute  Volume:  mute  Mood:  mute  Affect:  Flat  Thought Process:  NA is mute  Orientation:  Other:  to self  Thought Content:  seen as responding to internal stimuli, appears paranoid  Suicidal Thoughts:  did not express any  Homicidal Thoughts:  did not express any  Memory:  unable to assess  Judgement:  Poor  Insight:  Lacking  Psychomotor Activity:  Decreased  Concentration:  Concentration: Poor and Attention Span: Poor  Recall:  Poor  Fund of Knowledge:  Poor  Language:  Poor  Akathisia:  No  Handed:  Right  AIMS (if indicated):     Assets:  Social Support  ADL's:  Intact  Cognition:  WNL  Sleep:  Number of Hours: 6.75   07/19/15 Collateral information was obtained from mother - Philis NettleLisa  williams - cell [phone]/ work [phone] according to mother - pt stopped taking his medications the last time he was discharged , was living with grandmother for a while , then mother went and picked him up. Pt usually stays at home , and at baseline is talkative - not mute . Per mother he could have been abusing alcohol which could have contributed to his decompensation. Mother is willing to pick him up on discharge.   Treatment Plan Summary:Andrea E Ernest Mallicksley is a 25 y.o. male who has a hx of schizophrenia , who presented to [facility] after being petitioned for involuntary commitment by his mother for psychosis. [facility] thereafter [facility]  [facility].Pt continues to be mute, will continue treatment. Patient has been compliant with his medications and has a slow therapeutic response. Pt also with Vitamin b12 deficiency - will continue to  replace .  Daily contact with patient to assess and evaluate symptoms and progress in treatment and Medication management   Reviewed past medical records,treatment plan.  Will continue Haldol decanoate 100 mg IM - q28 days - last dose 07/10/15. Will continue  Abilify 5 mg po daily for psychosis- to augment the effect of Haldol dec. Will continue Cogentin 1 mg po bid for EPS. Will continue Ativan 1 mg po bid for catatonic sx. Continue Celexa to 40 mg po daily for affective sx. Will make available prn medications as per agitation protocol. Will continue to monitor vitals ,medication compliance and treatment side effects while patient is here.  Will monitor for medical issues as well as call consult as needed.  Reviewed labs CBC, CMP - WNL , UDS- Negative , BAL<5 , EKG- qtc - wnl ,TSH- wnl . Pt with hx of Vitamin b12 deficiency - [facility] last admission -02/01/2015 - 23/30 . Repeat Vitamin b12 - LOW - 172- Will continue  Vitamin b12 1000 mcg SQ x 7 days . Will change the dose after that - could change to PO on discharge.  CSW will continue  working on disposition.  Patient to participate in therapeutic milieu .   Leata MouseJANARDHANA Simya Tercero, MD 07/23/2015, 1:59 PM

## 2015-07-24 MED ORDER — ARIPIPRAZOLE 10 MG PO TABS
10.0000 mg | ORAL_TABLET | Freq: Every day | ORAL | Status: DC
  Administered 2015-07-24 – 2015-07-25 (×2): 10 mg via ORAL
  Filled 2015-07-24 (×2): qty 1
  Filled 2015-07-24: qty 7
  Filled 2015-07-24: qty 1

## 2015-07-24 NOTE — Progress Notes (Signed)
D: Pt isolative and withdrawn to his room; was in bed all evening. Pt refused to answer any assessment questions (elective mutism). Pt did not look to be in any acute distress. A: Medications offered as prescribed.  Support, encouragement, and safe environment provided.  15-minute safety checks continue. R: Pt was med compliant.  Pt did not attend wrap-up group. Safety checks continue.

## 2015-07-24 NOTE — [facility] Group Notes (Signed)
[facility] Group Notes: (Clinical Social Work)   07/24/2015      Type of Therapy:  Group Therapy   Participation Level:  Did Not Attend despite MHT prompting   Krystiana Fornes Grossman-Orr, LCSW 07/24/2015, 11:59 AM     

## 2015-07-24 NOTE — Progress Notes (Signed)
Patient ID: Justin [name], male   DOB: 05/24/1990, 24 y.o.   MRN: [id number]    D: Pt has been very flat and depressed on the unit today. He remained to himself and did not interact a lot with peers or staff. Pt did take all medications as prescribed by the doctor, no issues or concerns noted. Pt reported that his depression was a 0, his hopelessness was a 0, and his anxiety was a 2. Pt reported that his goal for today was 0. Pt reported being negative SI/HI, no AH/VH noted. A: 15 min checks continued for patient safety. R: Pt safety maintained.  

## 2015-07-24 NOTE — Progress Notes (Signed)
[facility]  07/24/2015 1:18 PM Hermina BartersRobert E Schweers  MRN:  [id number]   Subjective:  continue to be mute when asked a few questions his responses nothing.  Objective: Hermina BartersRobert E Weisenberger is a 25 y.o. male who has a hx of schizophrenia, noncompliant with medication admitted involuntarily by petitioned from his mother for increased symptoms of psychosis. Patient has been isolating, seems to be preoccupied his own thoughts and ruminations and not able to socialize or communicate with the staff members or peer group. Patient does follow the instructions given to him and sometimes needed frequent redirection's.   Patient has been taking his medications when offered as well as taking his meals he does not have any extrapyramidal symptoms or others adverse effect of the medication. Pt continues to have no eye contact and continues to ignore Clinical research associatewriter when Clinical research associatewriter attempts to evaluate him.Will continue to encourage and support.  Principal Problem: Schizophrenia, paranoid type (HCC) Diagnosis:   Patient Active Problem List   Diagnosis Date Noted  . 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 Acute psychosis [F29]   . 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 Hx of epistaxis [Z87.09] 02/01/2015  . Vitamin B12 deficiency [E53.8] 01/22/2015  . 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 No significant past medical history [IMO0001]    Total Time spent with patient: 20 minutes  Past Psychiatric History: Please see H&P.   Past Medical History:  Past Medical History  Diagnosis Date  . No significant past medical history   . Homelessness   . Abnormal behavior    History reviewed. No pertinent past surgical history. Family History:  Family History  Problem Relation Age of Onset  . Depression Father   . Bipolar disorder Father   . Alcohol abuse Father    Family Psychiatric  History: Please see H&P.  Social History:  History  Alcohol Use No     History  Drug Use Not on file     Social History   Social History  . Marital Status: Single    Spouse Name: N/A  . Number of Children: N/A  . Years of Education: N/A   Social History Main Topics  . Smoking status: Smoker, Current Status Unknown  . Smokeless tobacco: None  . Alcohol Use: No  . Drug Use: None  . Sexual Activity: Not Asked   Other Topics Concern  . None   Social History Narrative   ** Merged History Encounter **       Additional Social History:       Sleep: Fair  Appetite:  Fair  Current Medications: Current Facility-Administered Medications  Medication Dose Route Frequency Provider Last Rate Last Dose  . ARIPiprazole (ABILIFY) tablet 10 mg  10 mg Oral QHS Leata MouseJanardhana Ranier Coach, MD      . benztropine (COGENTIN) tablet 1 mg  1 mg Oral BID Kerry HoughSpencer E Simon, PA-C   1 mg at 07/24/15 0836  . citalopram (CELEXA) tablet 40 mg  40 mg Oral Daily Jomarie LongsSaramma Eappen, MD   40 mg at 07/24/15 0836  . cyanocobalamin ((VITAMIN B-12)) injection 1,000 mcg  1,000 mcg Subcutaneous Daily Jomarie LongsSaramma Eappen, MD   1,000 mcg at 07/24/15 0836  . [START ON 08/05/2015] haloperidol decanoate (HALDOL DECANOATE) 100 MG/ML injection 100 mg  100 mg Intramuscular Q28 days Saramma Eappen, MD      . LORazepam (ATIVAN) tablet 1 mg  1 mg Oral Q6H PRN Jomarie LongsSaramma Eappen, MD       Or  . LORazepam (  ATIVAN) injection 1 mg  1 mg Intramuscular Q6H PRN Jomarie LongsSaramma Eappen, MD      . LORazepam (ATIVAN) tablet 1 mg  1 mg Oral BID Kerry HoughSpencer E Simon, PA-C   1 mg at 07/24/15 0836  . traZODone (DESYREL) tablet 100 mg  100 mg Oral QHS Kerry HoughSpencer E Simon, PA-C   100 mg at 07/23/15 2124  . ziprasidone (GEODON) capsule 20 mg  20 mg Oral BID PRN Jomarie LongsSaramma Eappen, MD       Or  . ziprasidone (GEODON) injection 10 mg  10 mg Intramuscular BID PRN Jomarie LongsSaramma Eappen, MD        Lab Results:  No results found for this or any previous visit (from the past 48 hour(s)).  Blood Alcohol level:  Lab Results  Component Value Date   ETH <5 07/08/2015   ETH <5 01/19/2015     Physical Findings: AIMS: Facial and Oral Movements Muscles of Facial Expression: None, normal Lips and Perioral Area: None, normal Jaw: None, normal Tongue: None, normal,Extremity Movements Upper (arms, wrists, hands, fingers): None, normal Lower (legs, knees, ankles, toes): None, normal, Trunk Movements Neck, shoulders, hips: None, normal, Overall Severity Severity of abnormal movements (highest score from questions above): None, normal Incapacitation due to abnormal movements: None, normal Patient's awareness of abnormal movements (rate only patient's report): No Awareness, Dental Status Current problems with teeth and/or dentures?: No Does patient usually wear dentures?: No  CIWA:  CIWA-Ar Total: 0 COWS:     Musculoskeletal: Strength & Muscle Tone: within normal limits Gait & Station: normal Patient leans: N/A  Psychiatric Specialty Exam: Physical Exam  Nursing note and vitals reviewed.   Review of Systems  Unable to perform ROS: mental acuity    Blood pressure 81/49, pulse 103, temperature 98.4 F (36.9 C), temperature source Oral, resp. rate 16.There is no weight on file to calculate BMI.  General Appearance: Guarded  Eye Contact:  Poor  Speech:  mute  Volume:  mute  Mood:  mute  Affect:  Flat  Thought Process:  NA is mute  Orientation:  Other:  to self  Thought Content:  seen as responding to internal stimuli, appears paranoid  Suicidal Thoughts:  did not express any  Homicidal Thoughts:  did not express any  Memory:  unable to assess  Judgement:  Poor  Insight:  Lacking  Psychomotor Activity:  Decreased  Concentration:  Concentration: Poor and Attention Span: Poor  Recall:  Poor  Fund of Knowledge:  Poor  Language:  Poor  Akathisia:  No  Handed:  Right  AIMS (if indicated):     Assets:  Social Support  ADL's:  Intact  Cognition:  WNL  Sleep:  Number of Hours: 6.75   07/19/15 Collateral information was obtained from mother - Philis NettleLisa williams - cell  [phone]/ work [phone] according to mother - pt stopped taking his medications the last time he was discharged , was living with grandmother for a while , then mother went and picked him up. Pt usually stays at home , and at baseline is talkative - not mute . Per mother he could have been abusing alcohol which could have contributed to his decompensation. Mother is willing to pick him up on discharge.   Treatment Plan Summary:Ryon E Ernest Mallicksley is a 25 y.o. male who has a hx of schizophrenia , who presented to [facility] ED after being petitioned for involuntary commitment by his mother for psychosis. [facility].Pt continues to  be mute, will continue treatment. Patient has been compliant with his medications and has a slow therapeutic response. Pt also with Vitamin b12 deficiency - will continue to  replace .  Daily contact with patient to assess and evaluate symptoms and progress in treatment and Medication management   Reviewed past medical records,treatment plan.  Will continue Haldol decanoate 100 mg IM - q28 days - last dose 07/10/15. Will increase  Abilify 10 mg po daily for psychosis- to augment the effect of Haldol dec. Will continue Cogentin 1 mg po bid for EPS. Will continue Ativan 1 mg po bid for catatonic sx. Continue Celexa to 40 mg po daily for affective sx. Will make available prn medications as per agitation protocol. Will continue to monitor vitals ,medication compliance and treatment side effects while patient is here.  Will monitor for medical issues as well as call consult as needed.  Reviewed labs CBC, CMP - WNL , UDS- Negative , BAL<5 , EKG- qtc - wnl ,TSH- wnl . Pt with hx of Vitamin b12 deficiency - [facility] last admission -02/01/2015 - 23/30 . Repeat Vitamin b12 - LOW - 172- Will continue  Vitamin b12 1000 mcg SQ x 7 days . Will change the dose after that - could change to PO on discharge.  CSW will continue working on  disposition.  Patient to participate in therapeutic milieu .   Leata Mouse, MD 07/24/2015, 1:18 PM

## 2015-07-24 NOTE — Progress Notes (Signed)
Adult Psychoeducational Group Note  Date:  07/24/2015 Time:  8:15 PM  Group Topic/Focus:  Wrap-Up Group:   The focus of this group is to help patients review their daily goal of treatment and discuss progress on daily workbooks.  Participation Level:  Did Not Attend  Pt did not attend wrap-up group. Pt was asleep.    [name] 07/24/2015, 8:49 PM

## 2015-07-25 MED ORDER — ARIPIPRAZOLE 10 MG PO TABS: 10.0000 mg | ORAL_TABLET | Freq: Every day | ORAL | Status: DC

## 2015-07-25 MED ORDER — CYANOCOBALAMIN 1000 MCG PO TABS: 1000.0000 ug | ORAL_TABLET | Freq: Every day | ORAL | Status: DC

## 2015-07-25 MED ORDER — BENZTROPINE MESYLATE 1 MG PO TABS: 1.0000 mg | ORAL_TABLET | Freq: Two times a day (BID) | ORAL | Status: DC

## 2015-07-25 MED ORDER — HALOPERIDOL DECANOATE 100 MG/ML IM SOLN: 100.0000 mg | INTRAMUSCULAR | Status: DC

## 2015-07-25 MED ORDER — CYANOCOBALAMIN 1000 MCG/ML IJ SOLN: 1000.0000 ug | Freq: Every day | INTRAMUSCULAR | Status: DC

## 2015-07-25 MED ORDER — LORAZEPAM 1 MG PO TABS: 1.0000 mg | ORAL_TABLET | Freq: Two times a day (BID) | ORAL | Status: DC

## 2015-07-25 MED ORDER — CITALOPRAM HYDROBROMIDE 40 MG PO TABS: 40.0000 mg | ORAL_TABLET | Freq: Every day | ORAL | Status: DC

## 2015-07-25 MED ORDER — TRAZODONE HCL 100 MG PO TABS: 100.0000 mg | ORAL_TABLET | Freq: Every day | ORAL | Status: DC

## 2015-07-25 MED ORDER — VITAMIN B-12 1000 MCG PO TABS
1000.0000 ug | ORAL_TABLET | Freq: Every day | ORAL | Status: DC
  Filled 2015-07-25: qty 7

## 2015-07-25 NOTE — [facility] Group Notes (Signed)
[facility]   07/25/2015 10:02 AM  Participation Quality:  Minimal  Mood/Affect:  Blunted  Depression Rating:    Anxiety Rating:    Thoughts of Suicide:  unknown Will you contract for safety?   unknown  Current AVH:  unnown  Plan for Discharge/Comments:  Selectively mute.  Said nothing.  Stayed the entire time.  Transportation Means:   Supports:  Daryel GeraldNorth, Ernie Kasler B

## 2015-07-25 NOTE — Plan of Care (Signed)
Problem: Coping: Goal: Ability to verbalize frustrations and anger appropriately will improve Outcome: Progressing Nurse discussed depression/coping skills with patient.

## 2015-07-25 NOTE — Progress Notes (Signed)
D: Pt isolative and withdrawn to his room; was in bed for most of the evening. Pt refused to answer any assessment questions (elective mutism). Pt did not look to be in any acute distress. A: Medications offered as prescribed.  Support, encouragement, and safe environment provided.  15-minute safety checks continue. R: Pt was med compliant.  Pt did not attend wrap-up group. Safety checks continue.

## 2015-07-25 NOTE — Discharge Summary (Signed)
Physician Discharge Summary Note  Patient:  Justin [name] is an 25 y.o., male MRN:  [id number] DOB:  03/17/1990 Patient phone:  [phone] (home)  Patient address:   [place] [address],  Total Time spent with patient: 30 minutes  Date of Admission:  07/18/2015  Date of Discharge: 07/25/2015  Reason for Admission: Worsening symptoms of Schizophrenia.  Principal Problem: Schizophrenia, paranoid type [facility])  Discharge Diagnoses: Patient Active Problem List   Diagnosis Date Noted  . 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 Acute psychosis [F29]   . 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 Hx of epistaxis [Z87.09] 02/01/2015  . Vitamin B12 deficiency [E53.8] 01/22/2015  . 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 No significant past medical history [IMO0001]     Past Psychiatric History: Paranoid Schizophrenia  Past Medical History:  Past Medical History  Diagnosis Date  . No significant past medical history   . Homelessness   . Abnormal behavior    History reviewed. No pertinent past surgical history. Family History:  Family History  Problem Relation Age of Onset  . Depression Father   . Bipolar disorder Father   . Alcohol abuse Father    Family Psychiatric  History: See H&P  Social History:  History  Alcohol Use No     History  Drug Use Not on file    Social History   Social History  . Marital Status: Single    Spouse Name: N/A  . Number of Children: N/A  . Years of Education: N/A   Social History Main Topics  . Smoking status: Smoker, Current Status Unknown  . Smokeless tobacco: None  . Alcohol Use: No  . Drug Use: None  . Sexual Activity: Not Asked   Other Topics Concern  . None   Social History Narrative   ** Merged History Encounter **       Hospital Course:  Justin [name] is a 25 y.o. male who has a hx of schizophrenia, who presented to [facility] ED after being petitioned for involuntary commitment by his  mother, Terrial [name] [phone].  Justin [name] was admitted to the [facility] adult unit with worsening symptoms of Schizohrenia under IVC by his mother. He has Bipolar disorder & Schizophrenia. His condition is chronic. Reports indicated that he was not compliant with his ordered medication regimen & has been committed twice to the mental health institution for mood stabilization treatment due to worsening symptoms. Apparently, Justin [name] was threatening to kill himself as it was being directed to do so by the devil. He was threatening to kill his family as well. He is currently admitted to the [facility] for mood stabilization treatments.    During the course of his hospitalization, Justin [name] was medicated & discharged on; Abilify 10 mg for mood control, Cogentin 1 mg for EPS, Citalopram 40 mg for depression, Lorazepam 1 mg for anxiety, Haldol Decanoate 100 mg/ml injectable Q 28 days  for mood control & Trazodone 100 mg for insomnia. He also received other medication regimen for the other medical issues that he presented. [name] was enrolled & participated in the Group counseling sessions being offered and held on this unit. He learned coping skills that should help him cope better & maintain mood stability after discharge. He tolerated his treatment regimen without any adverse effects or reactions reported.  Justin [name]'s symptoms responded well to his treatment regimen. This evidenced by his reports of improved mood & presentation of good affect. He is curently being  discharged as his mood has stabilized. However, he is going home on 2 separate antipsychotic medications (Abilify & Haldol decanoate injectable). This is because his symptoms did not respond well under an antipsychotic monotherapy.  However, the combination therapy of Abilify 10 mg tablet daily & haldol decanoate 100 mg/ml Q 28 days seem to have helped improve his symptoms. As a result, it is necessary for Justin [name] to continue on these combination therapies  to achieve maximum symptom control after discharge. However, in the event that his symptoms happen to improve or decrease, he can then be titrated down to an antipsychotic monotherapy to avoid risks of metabolic syndrome and other related adverse effects.This has to be done within the proper evaluation & judgement of his outpatient provider.   Justin [name] is currently being discharged to to continue mental health care on an outpatient clinic for routine psychiatric care & medication management as noted below. [facility] is also [facility] with an appointment for his medical care needs. He is provided with all the necessary information required to make these appointments without problems. Upon discharge, he adamantly denies any SIHI, AVH, delusional thoughts and or paranoia. He was provided with a 14 days worth, supply samples of his [facility]. [facility] all personal belongings in no apparent distress. Transportation per mother.   Physical Findings: AIMS: Facial and Oral Movements Muscles of Facial Expression: None, normal Lips and Perioral Area: None, normal Jaw: None, normal Tongue: None, normal,Extremity Movements Upper (arms, wrists, hands, fingers): None, normal Lower (legs, knees, ankles, toes): None, normal, Trunk Movements Neck, shoulders, hips: None, normal, Overall Severity Severity of abnormal movements (highest score from questions above): None, normal Incapacitation due to abnormal movements: None, normal Patient's awareness of abnormal movements (rate only patient's report): No Awareness, Dental Status Current problems with teeth and/or dentures?: No Does patient usually wear dentures?: No  CIWA:  CIWA-Ar Total: 1 COWS:  COWS Total Score: 1  Musculoskeletal: Strength & Muscle Tone: within normal limits Gait & Station: normal Patient leans: N/A  Psychiatric Specialty Exam: Review of Systems  Constitutional: Negative.   HENT:  Negative.   Eyes: Negative.   Cardiovascular: Negative.   Gastrointestinal: Negative.   Genitourinary: Negative.   Skin: Negative.   Neurological: Negative.   Psychiatric/Behavioral: Positive for depression (Stable). Negative for suicidal ideas, hallucinations (Hx of), memory loss and substance abuse. The patient has insomnia (Stable). The patient is not nervous/anxious.   All other systems reviewed and are negative.   Blood pressure 103/43, pulse 102, temperature 97.7 F (36.5 C), temperature source Oral, resp. rate 12.There is no weight on file to calculate BMI.  See Md's SRA   Have you used any form of tobacco in the last 30 days? (Cigarettes, Smokeless Tobacco, Cigars, and/or Pipes): Patient Refused Screening  Has this patient used any form of tobacco in the last 30 days? (Cigarettes, Smokeless Tobacco, Cigars, and/or Pipes): No  Metabolic Disorder Labs:  Lab Results  Component Value Date   HGBA1C 4.7* 07/19/2015   MPG 88 07/19/2015   No results found for: PROLACTIN Lab Results  Component Value Date   CHOL 167 07/19/2015   TRIG 60 07/19/2015   HDL 42 07/19/2015   CHOLHDL 4.0 07/19/2015   VLDL 12 07/19/2015   LDLCALC 113* 07/19/2015    See Psychiatric Specialty Exam and Suicide Risk Assessment completed by Attending Physician prior to discharge.  Discharge destination:  Home  Is patient on multiple antipsychotic therapies at  discharge:  Yes,   Do you recommend tapering to monotherapy for antipsychotics?  Yes    Has Patient had three or more failed trials of antipsychotic monotherapy by history:  No (Chart review indicated patient has only been tried on Haldol until the addition of Abilify.  Recommended Plan for Multiple Antipsychotic Therapies: And because August has not been able to achieve symptoms control under an antipsychotic monotherapy, he is currently receiving and being discharged on 2 separate antipsychotic medications Abilify & Haldol which seem effective in  managing his symptoms at this time. It will benefit [place] to continue on these combination antipsychotic therapies as recommended. However, as his symptoms continue to improve, he may be titrated down to an antipsychotic monotherapy to help prevent risks for development of metabolic syndrome associated with use of multiple antipsychotic medications. This has to be done within the discretion & proper judgement of his outpatient provider.       Discharge Instructions    Discharge instructions    Complete by:  As directed   Please follow-up appointment made for at the [facility] center for medical care & follow-up care for yr low Vitamin B-12.            Medication List    STOP taking these medications        cyanocobalamin 1000 MCG/ML injection  Commonly known as:  (VITAMIN B-12)  Replaced by:  cyanocobalamin 1000 MCG tablet     haloperidol 10 MG tablet  Commonly known as:  HALDOL     ibuprofen 600 MG tablet  Commonly known as:  ADVIL,MOTRIN      TAKE these medications      Indication   ARIPiprazole 10 MG tablet  Commonly known as:  ABILIFY  Take 1 tablet (10 mg total) by mouth at bedtime. For mood control   Indication:  Mood control     benztropine 1 MG tablet  Commonly known as:  COGENTIN  Take 1 tablet (1 mg total) by mouth 2 (two) times daily. For prevention of drug induced tremors   Indication:  Extrapyramidal Reaction caused by Medications     citalopram 40 MG tablet  Commonly known as:  CELEXA  Take 1 tablet (40 mg total) by mouth daily. For depression   Indication:  Depression     cyanocobalamin 1000 MCG tablet  Take 1 tablet (1,000 mcg total) by mouth daily. For low B-12 replacement   Indication:  Inadequate Vitamin B12, Vitamin B12 Absorption Study     haloperidol decanoate 100 MG/ML injection  Commonly known as:  HALDOL DECANOATE  Inject 1 mL (100 mg total) into the muscle every 28 (twenty-eight) days. (Due on 08-05-15): For mood control   Start taking on:  08/05/2015   Indication:  Mood control     LORazepam 1 MG tablet  Commonly known as:  ATIVAN  Take 1 tablet (1 mg total) by mouth 2 (two) times daily. For anxiety   Indication:  Catatonia, Anxiety     traZODone 100 MG tablet  Commonly known as:  DESYREL  Take 1 tablet (100 mg total) by mouth at bedtime. For sleep   Indication:  Trouble Sleeping       Follow-up Information    Follow [facility] with [facility]   Specialty:  Behavioral Health   Why:  Go to the walk-in clinic M-F between 8 and 10AM for your hospital follow up appointment   Contact information:   [address] [place] [address] [phone]  Follow up with [facility] On 08/01/2015.   Why:  Follow-up care for all your medical care needs.   Contact information:   [address] [place] [phone]     Follow-up recommendations: Activity:  As tolerated Diet: As recommended by your primary care doctor. Keep all scheduled follow-up appointments as recommended.   Comments: Patient is instructed prior to discharge to: Take all medications as prescribed by his/her mental healthcare provider. Report any adverse effects and or reactions from the medicines to his/her outpatient provider promptly. Patient has been instructed & cautioned: To not engage in alcohol and or illegal drug use while on prescription medicines. In the event of worsening symptoms, patient is instructed to call the crisis hotline, 911 and or go to the nearest ED for appropriate evaluation and treatment of symptoms. To follow-up with his/her primary care provider for your other medical issues, concerns and or health care needs.   Signed: Sanjuana KavaNwoko, Caleen Taaffe I, FNP-BC, PMHNP 07/28/2015, 12:43 PM

## 2015-07-25 NOTE — [facility] Group Notes (Signed)
B[facility] LCSW Group Therapy  07/25/2015 1:15 pm  Type of Therapy: Process Group Therapy  Participation Level:  Active  Participation Quality:  Appropriate  Affect:  Flat  Cognitive:  Oriented  Insight:  Improving  Engagement in Group:  Limited  Engagement in Therapy:  Limited  Modes of Intervention:  Activity, Clarification, Education, Problem-solving and Support  Summary of Progress/Problems: Today's group addressed the issue of overcoming obstacles.  Patients were asked to identify their biggest obstacle post d/c that stands in the way of their on-going success, and then problem solve as to how to manage this. Invited.  Chose to not attend. Daryel Geraldorth, Dwan Fennel B 07/25/2015   4:10 PM

## 2015-07-25 NOTE — Progress Notes (Signed)
[facility] MD Progress Note  07/25/2015 3:27 PM Justin [name]  MRN:  [id number]   Subjective: Pt states " I am fine."   Objective: KAMRYN GAUTHIER is a 25 y.o. male who has a hx of schizophrenia, noncompliant with medication admitted involuntarily by petitioned from his mother for increased symptoms of psychosis.   Pt initially presented as mute , however today was observed as being able to communicate with writer minimally. Pt denied any depressive sx and reports anxiety as improving. Pt wanted to know about his discharge plan. Per staff - pt is observed as mute often , but is improving , communicating more and is compliant on medications.  Principal Problem: Schizophrenia, paranoid type (HCC) Diagnosis:   Patient Active Problem List   Diagnosis Date Noted  . 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 Acute psychosis [F29]   . 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 Hx of epistaxis [Z87.09] 02/01/2015  . Vitamin B12 deficiency [E53.8] 01/22/2015  . 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 No significant past medical history [IMO0001]    Total Time spent with patient: 20 minutes  Past Psychiatric History: Please see H&P.   Past Medical History:  Past Medical History  Diagnosis Date  . No significant past medical history   . Homelessness   . Abnormal behavior    History reviewed. No pertinent past surgical history. Family History:  Family History  Problem Relation Age of Onset  . Depression Father   . Bipolar disorder Father   . Alcohol abuse Father    Family Psychiatric  History: Please see H&P.  Social History:  History  Alcohol Use No     History  Drug Use Not on file    Social History   Social History  . Marital Status: Single    Spouse Name: N/A  . Number of Children: N/A  . Years of Education: N/A   Social History Main Topics  . Smoking status: Smoker, Current Status Unknown  . Smokeless tobacco: None  . Alcohol Use: No  . Drug Use: None   . Sexual Activity: Not Asked   Other Topics Concern  . None   Social History Narrative   ** Merged History Encounter **       Additional Social History:       Sleep: Fair  Appetite:  Fair  Current Medications: Current Facility-Administered Medications  Medication Dose Route Frequency Provider Last Rate Last Dose  . ARIPiprazole (ABILIFY) tablet 10 mg  10 mg Oral QHS Leata Mouse, MD   10 mg at 07/24/15 2127  . benztropine (COGENTIN) tablet 1 mg  1 mg Oral BID Kerry Hough, PA-C   1 mg at 07/25/15 4098  . citalopram (CELEXA) tablet 40 mg  40 mg Oral Daily Jomarie Longs, MD   40 mg at 07/25/15 0808  . cyanocobalamin ((VITAMIN B-12)) injection 1,000 mcg  1,000 mcg Subcutaneous Daily Jomarie Longs, MD   1,000 mcg at 07/25/15 1191  . [START ON 08/05/2015] haloperidol decanoate (HALDOL DECANOATE) 100 MG/ML injection 100 mg  100 mg Intramuscular Q28 days Vandy Tsuchiya, MD      . LORazepam (ATIVAN) tablet 1 mg  1 mg Oral Q6H PRN Jomarie Longs, MD       Or  . LORazepam (ATIVAN) injection 1 mg  1 mg Intramuscular Q6H PRN Ahmet Schank, MD      . LORazepam (ATIVAN) tablet 1 mg  1 mg Oral BID Kerry Hough, PA-C   1 mg  at 07/25/15 0808  . traZODone (DESYREL) tablet 100 mg  100 mg Oral QHS Kerry HoughSpencer E Simon, PA-C   100 mg at 07/24/15 2132  . ziprasidone (GEODON) capsule 20 mg  20 mg Oral BID PRN Jomarie LongsSaramma Angelise Petrich, MD       Or  . ziprasidone (GEODON) injection 10 mg  10 mg Intramuscular BID PRN Jomarie LongsSaramma Cobi Aldape, MD        Lab Results:  No results found for this or any previous visit (from the past 48 hour(s)).  Blood Alcohol level:  Lab Results  Component Value Date   ETH <5 07/08/2015   ETH <5 01/19/2015    Physical Findings: AIMS: Facial and Oral Movements Muscles of Facial Expression: None, normal Lips and Perioral Area: None, normal Jaw: None, normal Tongue: None, normal,Extremity Movements Upper (arms, wrists, hands, fingers): None, normal Lower (legs, knees,  ankles, toes): None, normal, Trunk Movements Neck, shoulders, hips: None, normal, Overall Severity Severity of abnormal movements (highest score from questions above): None, normal Incapacitation due to abnormal movements: None, normal Patient's awareness of abnormal movements (rate only patient's report): No Awareness, Dental Status Current problems with teeth and/or dentures?: No Does patient usually wear dentures?: No  CIWA:  CIWA-Ar Total: 1 COWS:  COWS Total Score: 1  Musculoskeletal: Strength & Muscle Tone: within normal limits Gait & Station: normal Patient leans: N/A  Psychiatric Specialty Exam: Physical Exam  Nursing note and vitals reviewed.   Review of Systems  Psychiatric/Behavioral: The patient is nervous/anxious.   All other systems reviewed and are negative.   Blood pressure 101/58, pulse 76, temperature 97.8 F (36.6 C), temperature source Oral, resp. rate 16.There is no weight on file to calculate BMI.  General Appearance: Guarded  Eye Contact:  Minimal  Speech:  Normal Rate limited  Volume: normal  Mood:  Anxious  Affect:  Congruent  Thought Process:  Goal Directed and Descriptions of Associations: Circumstantial   Orientation:  Full (Time, Place, and Person)  Thought Content:  Rumination  Suicidal Thoughts:  No  Homicidal Thoughts:  No  Memory:  Immediate;   Fair Recent;   Poor Remote;   Poor  Judgement:  Poor  Insight:  Lacking  Psychomotor Activity:  Decreased  Concentration:  Concentration: Poor and Attention Span: Poor  Recall:  Poor  Fund of Knowledge:  Poor  Language:  Poor  Akathisia:  No  Handed:  Right  AIMS (if indicated):     Assets:  Social Support  ADL's:  Intact  Cognition:  WNL  Sleep:  Number of Hours: 6   07/19/15 Collateral information was obtained from mother - Philis NettleLisa williams - cell [phone]/ work [phone]- according to mother - pt stopped taking his medications the last time he was discharged , was living with grandmother  for a while , then mother went and picked him up. Pt usually stays at home , and at baseline is talkative - not mute . Per mother he could have been abusing alcohol which could have contributed to his decompensation. Mother is willing to pick him up on discharge.   Treatment Plan Summary:Adnan E Ernest Mallicksley is a 25 y.o. male who has a hx of schizophrenia , who presented to [facility] ED after being petitioned for involuntary commitment by his mother for psychosis. Pt thereafter [facility].Pt on admission was mute , but today was able to communicate with Clinical research associatewriter better.  Pt also with Vitamin b12 deficiency - will continue to  replace .  Daily contact with patient to assess and evaluate symptoms and progress in treatment and Medication management   Reviewed past medical records,treatment plan.  Will continue Haldol decanoate 100 mg IM - q28 days - last dose 07/10/15. Will continue Abilify 10 mg po daily for psychosis- to augment the effect of Haldol dec. Will continue Cogentin 1 mg po bid for EPS. Will continue Ativan 1 mg po bid for catatonic sx. Continue Celexa to 40 mg po daily for affective sx. Will make available prn medications as per agitation protocol. Will continue to monitor vitals ,medication compliance and treatment side effects while patient is here.  Will monitor for medical issues as well as call consult as needed.  Reviewed labs CBC, CMP - WNL , UDS- Negative , BAL<5 , EKG- qtc - wnl ,TSH- wnl . Pt with hx of Vitamin b12 deficiency - [facility] last admission -02/01/2015 - 23/30 . Repeat Vitamin b12 - LOW - 172- Will continue  Vitamin b12 1000 mcg SQ x 7 days . Will change the dose after that - could change to PO on discharge.  CSW will continue working on disposition.  Patient to participate in therapeutic milieu .   Mersadies Petree, MD 07/25/2015, 3:27 PM

## 2015-07-25 NOTE — Progress Notes (Signed)
D:  Patient's self inventory sheet, patient sleeps good, no sleep medication given.  Good appetite, high energy level, poor concentration.  Denied depression, hopeless and anxiety.  Denied withdrawals.  Denied withdrawals.  Denied physical problems.  Denied pain.   A:  Medications administered per MD orders.  Emoitional support and encouragement given patient. R:  Denied SI and HI, contracts for safety.  Denied A/V hallucinations.  Safety maintained with 15 minute checks.

## 2015-07-25 NOTE — BHH Suicide Risk Assessment (Signed)
[facility] INPATIENT:  Family/Significant Other Suicide Prevention Education  Suicide Prevention Education:  Education Completed; Ms Justin [name], mother, [phone], w: [phone]  has been identified by the patient as the family member/significant other with whom the patient will be residing, and identified as the person(s) who will aid the patient in the event of a mental health crisis (suicidal ideations/suicide attempt).  With written consent from the patient, the family member/significant other has been provided the following suicide prevention education, prior to the and/or following the discharge of the patient.  The suicide prevention education provided includes the following:  Suicide risk factors  Suicide prevention and interventions  National Suicide Hotline telephone number  [facility] assessment telephone number  [facility] Emergency Assistance 911  [facility] and/or Residential Mobile Crisis Unit telephone number  Request made of family/significant other to:  Remove weapons (e.g., guns, rifles, knives), all items previously/currently identified as safety concern.    Remove drugs/medications (over-the-counter, prescriptions, illicit drugs), all items previously/currently identified as a safety concern.  The family member/significant other verbalizes understanding of the suicide prevention education information provided.  The family member/significant other agrees to remove the items of safety concern listed above.  Justin [name], Justin [name] 07/25/2015, 4:14 PM
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
## 2015-07-26 ENCOUNTER — Encounter (HOSPITAL_COMMUNITY): Payer: Self-pay | Admitting: Psychiatry

## 2015-07-26 MED ORDER — CYANOCOBALAMIN 1000 MCG PO TABS: 1000.0000 ug | ORAL_TABLET | Freq: Every day | ORAL | Status: DC

## 2015-07-26 MED ORDER — VITAMIN B-12 1000 MCG PO TABS
1000.0000 ug | ORAL_TABLET | Freq: Every day | ORAL | Status: DC
  Filled 2015-07-26: qty 1

## 2015-07-26 NOTE — Progress Notes (Signed)
  [facility] Discharge [facility] :  Will you be returning to the same living situation after discharge:  Yes,  home At discharge, do you have transportation home?: Yes,  mother Do you have the ability to pay for your medications: Yes,  mental health  Release of information consent forms completed and in the chart;  Patient's signature needed at discharge.  Patient to Follow up at: Follow-up Information    [facility]   Specialty:  Behavioral Health   Why:  Go to the walk-in clinic M-F between 8 and 10AM for your hospital follow up appointment   Contact information:   [address] [place] [address] [phone]       Next level of care provider has access to [facility] Link:no  Safety Planning and Suicide Prevention discussed: Yes,  yes  Have you used any form of tobacco in the last 30 days? (Cigarettes, Smokeless Tobacco, Cigars, and/or Pipes): Patient Refused Screening  Has patient been referred to the Quitline?: Patient refused referral  Patient has been referred for addiction treatment: N/A  Justin [name], Justin [name] 07/26/2015, 9:12 AM

## 2015-07-26 NOTE — Progress Notes (Addendum)
D: Patient in room, awake and lying in bed. He is calm with minimal verbal response. Voices no suicidal or homicidal ideation or AVH. A: Scheduled medications given as ordered. Emotional support and encouragement offered. Encouraged him to seek assistance with needs/concerns. R: Safety maintained on unit. Will remain on q 15 minute safety checks.

## 2015-07-26 NOTE — Progress Notes (Signed)
The focus of this group is to help patients review their daily goal of treatment and discuss progress on daily workbooks.  Patient did not attend group 

## 2015-07-26 NOTE — [facility] Suicide Risk Assessment (Signed)
[facility]   Principal Problem: Schizophrenia, paranoid type [facility](HCC) Discharge Diagnoses:  Patient Active Problem List   Diagnosis Date Noted  . 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 Acute psychosis [F29]   . 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 Hx of epistaxis [Z87.09] 02/01/2015  . Vitamin B12 deficiency [E53.8] 01/22/2015  . 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 No significant past medical history [IMO0001]     Total Time spent with patient: 30 minutes  Musculoskeletal: Strength & Muscle Tone: within normal limits Gait & Station: normal Patient leans: N/A  Psychiatric Specialty Exam: Review of Systems  Psychiatric/Behavioral: Negative for depression, suicidal ideas and hallucinations. The patient is not nervous/anxious.   All other systems reviewed and are negative.   Blood pressure 103/43, pulse 102, temperature 97.7 F (36.5 C), temperature source Oral, resp. rate 12.There is no weight on file to calculate BMI.  General Appearance: Casual  Eye Contact::  Fair  Speech:  Clear and Coherent409  Volume:  Normal  Mood:  Euthymic  Affect:  Congruent  Thought Process:  Goal Directed and Descriptions of Associations: Intact  Orientation:  Full (Time, Place, and Person)  Thought Content:  Logical  Suicidal Thoughts:  No  Homicidal Thoughts:  No  Memory:  Immediate;   Fair Recent;   Fair Remote;   Poor  Judgement:  Fair  Insight:  Shallow  Psychomotor Activity:  Normal  Concentration:  Fair  Recall:  [employer]Fair  Fund of Knowledge:Fair  Language: Fair  Akathisia:  No  Handed:  Right  AIMS (if indicated):     Assets:  Social Support  Sleep:  Number of Hours: 6.5  Cognition: WNL  ADL's:  Intact   Mental Status Per Nursing Assessment::   On Admission:  Thoughts of violence towards others, Plan to harm others  Demographic Factors:  Male and Unemployed  Loss Factors: Decrease in vocational status  Historical  Factors: Impulsivity  Risk Reduction Factors:   Living with another person, especially a relative and Positive social support  Continued Clinical Symptoms:  Schizophrenia:   Less than 27 years old Paranoid or undifferentiated type Previous Psychiatric Diagnoses and Treatments Medical Diagnoses and Treatments/Surgeries  Cognitive Features That Contribute To Risk:  None    Suicide Risk:  Minimal: No identifiable suicidal ideation.  Patients presenting with no risk factors but with morbid ruminations; may be classified as minimal risk based on the severity of the depressive symptoms  Follow-up Information    [facility]   Specialty:  Behavioral Health   Why:  Go to the walk-in clinic M-F between 8 and 10AM for your hospital follow up appointment   Contact information:   [address] [place] [address] [phone]       Plan Of Care/Follow-up recommendations:  Activity:  no restrictions Diet:  regular Tests:  Vitamin b12 deficiency needs to be followed on an out patient basis - will need to follow up with Primary medical doctor Other:  Haldol decanoate 100 mg IM repeat q28 days - next dose 08/05/15 .  Malika Demario, MD 07/26/2015, 8:44 AM

## 2015-07-26 NOTE — Progress Notes (Signed)
  D: Writer entered the room and called the pt by his name. Pt looked and rolled over on his back. However after several seconds the pt closed his eyes, and didn't open or answer questions. Pt has no questions or concerns.   A:  Support and encouragement was offered. 15 min checks continued for safety.  R: Pt remains safe.

## 2015-07-26 NOTE — Plan of Care (Signed)
Problem: Safety: Goal: Periods of time without injury will increase Outcome: Progressing Safety maintained on unit  Problem: Activity: Goal: Will verbalize the importance of balancing activity with adequate rest periods Outcome: Progressing Encouraged group participation

## 2015-07-26 NOTE — Progress Notes (Signed)
Patient ID: Justin BartersRobert E Hodzic, male   DOB: 26-Nov-1990, 25 y.o.   MRN: [id number]   Written/verbal discharge instructions, prescriptions, sample medications, SRA,  Follow-up appointments, and BH Transition report given to patient and patient's mother with verbalization of understanding.  Instructed mother that it was very important that patient take Vitamin B-12 daily to avoid problems with memory and that he should follow up with his medical doctor regarding this as well.  All patient belongings returned to him at time of discharge.  He denies suicidal and homicidal ideation and AVH; voices no concerns at time of discharge.  Discharged home in stable condition with mother.  Ambulatory upon discharge.

## 2015-07-26 NOTE — BHH Group Notes (Signed)
The focus of this group is to educate the patient on the purpose and policies of crisis stabilization and provide a format to answer questions about their admission.  The group details unit policies and expectations of patients while admitted.  Patient did not attend 0900 nurse education orientation group this morning.  Patient stayed in bed.   

## 2015-07-30 ENCOUNTER — Emergency Department (HOSPITAL_COMMUNITY)
Admission: EM | Admit: 2015-07-30 | Discharge: 2015-07-31 | Disposition: A | Discharge status: Home / Self Care | Payer: No Typology Code available for payment source | Attending: Emergency Medicine | Admitting: Emergency Medicine

## 2015-07-30 ENCOUNTER — Ambulatory Visit (HOSPITAL_COMMUNITY)
Admission: AD | Admit: 2015-07-30 | Discharge: 2015-07-30 | Disposition: A | Discharge status: Home / Self Care | Payer: MEDICAID | Attending: Psychiatry | Admitting: Psychiatry

## 2015-07-30 ENCOUNTER — Encounter (HOSPITAL_COMMUNITY): Payer: Self-pay | Admitting: Emergency Medicine

## 2015-07-30 DIAGNOSIS — F200 Paranoid schizophrenia: Secondary | ICD-10-CM | POA: Insufficient documentation

## 2015-07-30 DIAGNOSIS — Z590 Homelessness: Secondary | ICD-10-CM | POA: Insufficient documentation

## 2015-07-30 DIAGNOSIS — F17200 Nicotine dependence, unspecified, uncomplicated: Secondary | ICD-10-CM | POA: Insufficient documentation

## 2015-07-30 DIAGNOSIS — F209 Schizophrenia, unspecified: Secondary | ICD-10-CM | POA: Insufficient documentation

## 2015-07-30 DIAGNOSIS — Z818 Family history of other mental and behavioral disorders: Secondary | ICD-10-CM | POA: Insufficient documentation

## 2015-07-30 DIAGNOSIS — F919 Conduct disorder, unspecified: Secondary | ICD-10-CM | POA: Insufficient documentation

## 2015-07-30 DIAGNOSIS — Z79899 Other long term (current) drug therapy: Secondary | ICD-10-CM | POA: Insufficient documentation

## 2015-07-30 DIAGNOSIS — Z811 Family history of alcohol abuse and dependence: Secondary | ICD-10-CM | POA: Insufficient documentation

## 2015-07-30 MED ORDER — ACETAMINOPHEN 325 MG PO TABS: 650.0000 mg | ORAL_TABLET | ORAL | Status: DC | PRN

## 2015-07-30 MED ORDER — LORAZEPAM 1 MG PO TABS: 1.0000 mg | ORAL_TABLET | ORAL | Status: DC | PRN

## 2015-07-30 MED ORDER — CITALOPRAM HYDROBROMIDE 10 MG PO TABS: 40.0000 mg | ORAL_TABLET | Freq: Every day | ORAL | Status: DC

## 2015-07-30 MED ORDER — BENZTROPINE MESYLATE 1 MG PO TABS: 1.0000 mg | ORAL_TABLET | Freq: Two times a day (BID) | ORAL | Status: DC

## 2015-07-30 MED ORDER — ARIPIPRAZOLE 10 MG PO TABS
10.0000 mg | ORAL_TABLET | Freq: Every day | ORAL | Status: DC
  Filled 2015-07-30: qty 1

## 2015-07-30 NOTE — ED Provider Notes (Signed)
CSN: [id number]     Arrival date & time 07/30/15  2245 History  By signing my name below, I, Justin [name], attest that this documentation has been prepared under the direction and in the presence of Justin Rhineonald Rozanne Heumann, MD. Electronically Signed: Bridgette HabermannMaria [name], ED Scribe. 07/30/2015. 11:40 PM.   Chief Complaint  Patient presents with  . Medical Clearance  LEVEL 5 CAVEAT: HPI and ROS limited due to psychiatric disorder.  The history is provided by medical records. No language interpreter was used.    HPI Comments: Justin BartersRobert E Cavitt is a 25 y.o. male who presents to the Emergency Department due to his noncompliance with his medication. Pt was brought in by his mother because he has not been taking his behavioral health medications. [facility]. Pt refuses to speak and will not answer questions.  Past Medical History  Diagnosis Date  . No significant past medical history   . Homelessness   . Abnormal behavior    History reviewed. No pertinent past surgical history. Family History  Problem Relation Age of Onset  . Depression Father   . Bipolar disorder Father   . Alcohol abuse Father    Social History  Substance Use Topics  . Smoking status: Smoker, Current Status Unknown  . Smokeless tobacco: None  . Alcohol Use: No    Review of Systems  Unable to perform ROS: Psychiatric disorder   Allergies  Review of patient's allergies indicates no known allergies.  Home Medications   Prior to Admission medications   Medication Sig Start Date End Date Taking? Authorizing Provider  ARIPiprazole (ABILIFY) 10 MG tablet Take 1 tablet (10 mg total) by mouth at bedtime. For mood control 07/25/15   Sanjuana KavaAgnes I Nwoko, NP  benztropine (COGENTIN) 1 MG tablet Take 1 tablet (1 mg total) by mouth 2 (two) times daily. For prevention of drug induced tremors 07/25/15   Sanjuana KavaAgnes I Nwoko, NP  citalopram (CELEXA) 40 MG tablet Take 1 tablet (40 mg total) by mouth  daily. For depression 07/25/15   Sanjuana KavaAgnes I Nwoko, NP  haloperidol decanoate (HALDOL DECANOATE) 100 MG/ML injection Inject 1 mL (100 mg total) into the muscle every 28 (twenty-eight) days. (Due on 08-05-15): For mood control 08/05/15   Sanjuana KavaAgnes I Nwoko, NP  LORazepam (ATIVAN) 1 MG tablet Take 1 tablet (1 mg total) by mouth 2 (two) times daily. For anxiety 07/25/15   Sanjuana KavaAgnes I Nwoko, NP  traZODone (DESYREL) 100 MG tablet Take 1 tablet (100 mg total) by mouth at bedtime. For sleep 07/25/15   Sanjuana KavaAgnes I Nwoko, NP  vitamin B-12 1000 MCG tablet Take 1 tablet (1,000 mcg total) by mouth daily. For low B-12 replacement 07/27/15   Sanjuana KavaAgnes I Nwoko, NP   BP 127/78 mmHg  Pulse 69  Temp(Src) 97.9 F (36.6 C) (Oral)  Resp 16  SpO2 99% Physical Exam CONSTITUTIONAL: Well developed/well nourished HEAD: Normocephalic/atraumatic EYES: Pupils dilated bilaterally. No nystagmus ENMT: Mucous membranes moist NECK: supple no meningeal signs CV: S1/S2 noted, no murmurs/rubs/gallops noted LUNGS: Lungs are clear to auscultation bilaterally, no apparent distress ABDOMEN: soft NEURO: Pt is awake/alert/appropriate, moves all extremitiesx4. Pt is not speaking during exam EXTREMITIES: full ROM SKIN: warm, color normal PSYCH: Patient with flat affect, refusing to speak  ED Course  Procedures  DIAGNOSTIC STUDIES: Oxygen Saturation is 99% on RA, normal by my interpretation.    COORDINATION OF CARE: 11:37 PM Discussed treatment plan with pt at  bedside.  Pt brought in by mother as he is not taking his medications She is no longer present He has h/o psychiatric illness He has already seen TTS He is stable, vitals appropriate He is awake/alert but not speaking At this point, I don't have any evidence to file IVC paperwork, however it was felt that he may benefit [facility]  2:25 AM Pt not cooperating No other acute issues at this time Family has been called to take him home   MDM   Final  diagnoses:  Schizophrenia, unspecified type [facility](HCC)    Nursing notes including past medical history and social history reviewed and considered in documentation   I personally performed the services described in this documentation, which was scribed in my presence. The recorded information has been reviewed and is accurate.       Justin Rhineonald Brandyn Lowrey, MD 07/31/15 [phone]

## 2015-07-30 NOTE — ED Notes (Signed)
This RN went to assess pt. Pt refuses to interact with RN or answer questions. Per pt's mother, pt was told by her [facility] had [facility] because he had not been taking his behavioral health medications. Pt's mother stated that [facility]. Pt's mother stated that we "have to treat him." This RN told her that we can not force him to accept any treatment unless he is willing. The pt's mother stated that she understands that. Then the pt's mother left. Pt will not answer any questions and has ripped his ID arm band off
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
## 2015-07-31 NOTE — ED Notes (Signed)
Pt refusing to take medications. Per EDP, Charge RN has called pt's mother to come and get him

## 2015-07-31 NOTE — ED Notes (Signed)
Social worker called back, informed her about mother not showing up like she informed [employer] officer and now not answering calls or text.  SW states that she will look into patient's case.

## 2015-07-31 NOTE — ED Notes (Signed)
This RN answered call bell.  Patient stated that he did not hit it.  This RN asked patient if he would like a blanket or would like to have the chair laid back and patient did not respond to either question.  Patient covered his face with his shirt and put his head on the chair.

## 2015-07-31 NOTE — ED Notes (Signed)
TTS/social worker brought patient resources for outpatient treatment options.

## 2015-07-31 NOTE — ED Notes (Signed)
Attempted to call patient's mother again and unable to reach her. Straight to voicemail.

## 2015-07-31 NOTE — ED Notes (Addendum)
Pt ambulated to and from restroom with steady gait, refused urine sample.

## 2015-07-31 NOTE — ED Notes (Signed)
[employer] called and spoke with patient's mother.  States that she will be here in hour to get patient.

## 2015-07-31 NOTE — ED Notes (Signed)
Attempted to call mother, call went straight to voicemail

## 2015-07-31 NOTE — ED Notes (Signed)
Left message on voicemail for social worker to contact me back, regarding patient's mother not answering her phone or text messages regarding coming to pick discharged patient up.

## 2015-07-31 NOTE — ED Notes (Signed)
[employer] unable to get mother to answer phone call.  Sent mother text message asking when she will be here to get patient.  Awaiting reply back.
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
## 2015-07-31 NOTE — ED Notes (Signed)
Patient refused discharged vitals.  Patient's mother in lobby informing registration she was here to pick him up.  Patient informed ride was here. Patient got up and walked out into lobby.  Patient left with his ride.

## 2015-07-31 NOTE — ED Notes (Signed)
Patient noted to be in room sleeping.  Breathing even and unlabored.  NAD at this time.

## 2015-07-31 NOTE — ED Notes (Signed)
Asked patient if he wanted a blanket, patient refused to answer.

## 2015-07-31 NOTE — ED Notes (Signed)
Attempted to contact patients mother via phone.  Call went directly to voicemail.

## 2015-07-31 NOTE — ED Notes (Signed)
PATIENT WAS ASKED TWICE IF HE WOULD ALLOW BLOOD COLLECTIONS TO BE TAKEN AND BOTH TIMES PATIENT WOULD NOT RESPOND IN ANY WAY. COLLECTIONS WERE NOT TAKEN SINCE WE DID NOT CONSENT.
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
## 2015-07-31 NOTE — ED Notes (Signed)
Multiple attempts made to reach pt's mother for transport home w/o success.  Will inform Dr. Bebe ShaggyWickline.

## 2016-03-13 ENCOUNTER — Emergency Department (HOSPITAL_COMMUNITY): Payer: Self-pay

## 2016-03-13 ENCOUNTER — Encounter (HOSPITAL_COMMUNITY): Payer: Self-pay | Admitting: Emergency Medicine

## 2016-03-13 ENCOUNTER — Emergency Department (HOSPITAL_COMMUNITY)
Admission: EM | Admit: 2016-03-13 | Discharge: 2016-03-13 | Disposition: A | Discharge status: Home / Self Care | Payer: Self-pay | Attending: Emergency Medicine | Admitting: Emergency Medicine

## 2016-03-13 DIAGNOSIS — Y939 Activity, unspecified: Secondary | ICD-10-CM | POA: Insufficient documentation

## 2016-03-13 DIAGNOSIS — S61216A Laceration without foreign body of right little finger without damage to nail, initial encounter: Secondary | ICD-10-CM | POA: Insufficient documentation

## 2016-03-13 DIAGNOSIS — Y999 Unspecified external cause status: Secondary | ICD-10-CM | POA: Insufficient documentation

## 2016-03-13 DIAGNOSIS — S62636A Displaced fracture of distal phalanx of right little finger, initial encounter for closed fracture: Secondary | ICD-10-CM | POA: Insufficient documentation

## 2016-03-13 DIAGNOSIS — Y92254 Theater (live) as the place of occurrence of the external cause: Secondary | ICD-10-CM | POA: Insufficient documentation

## 2016-03-13 DIAGNOSIS — W231XXA Caught, crushed, jammed, or pinched between stationary objects, initial encounter: Secondary | ICD-10-CM | POA: Insufficient documentation

## 2016-03-13 MED ORDER — CEPHALEXIN 500 MG PO CAPS: 500.0000 mg | ORAL_CAPSULE | Freq: Four times a day (QID) | ORAL | 0 refills | Status: DC

## 2016-03-13 MED ORDER — HYDROCODONE-ACETAMINOPHEN 5-325 MG PO TABS: 1.0000 | ORAL_TABLET | Freq: Four times a day (QID) | ORAL | 0 refills | Status: DC | PRN

## 2016-03-13 MED ORDER — LIDOCAINE HCL (PF) 1 % IJ SOLN
5.0000 mL | Freq: Once | INTRAMUSCULAR | Status: DC
  Filled 2016-03-13: qty 5
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
## 2016-03-13 NOTE — Discharge Instructions (Signed)
You have a small break in the tip of your finger.  This will heal in 2-3 weeks.  Keep the splint on for 2-3 weeks except when showering.  The small laceration on your fingertip is very shallow and will heal well.  The flap of skin may fall off; this is OK.  Keep the wound clean and dry for the first 48 hours.  After that, you may wash it with soapy water.  Do not submerge your finger in water, but running water is OK.  The sutures will dissolve in 5-7 days.  Return if you have purulent discharge, increased pain, swelling, or redness streaking up the finger into the hand.

## 2016-03-13 NOTE — ED Provider Notes (Signed)
[facility]-EMERGENCY DEPT Provider Note   CSN: [id number] Arrival date & time: 03/13/16  0346     History   Chief Complaint Chief Complaint  Patient presents with  . Finger Injury    HPI Justin [name] is a 26 y.o. male.  Patient presents to the ED with a chief complaint of right small finger injury.  He states that he pinched his finger in between a table railing at work.  He complains of 7/10 pain.  The pain does not radiate.  He denies any weakness or numbness.  He has applied bacitracin.  Last tdap was within 5 years.  There are no other associated symptoms.   The history is provided by the patient. No language interpreter was used.    Past Medical History:  Diagnosis Date  . Abnormal behavior   . Homelessness   . No significant past medical history     Patient Active Problem List   Diagnosis Date Noted  . Schizophrenia, paranoid type (HCC) 07/18/2015  . Acute psychosis   . Mild cognitive disorder 02/01/2015  . Hx of epistaxis 02/01/2015  . Vitamin B12 deficiency 01/22/2015  . 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 No significant past medical history     History reviewed. No pertinent surgical history.     Home Medications    Prior to Admission medications   Medication Sig Start Date End Date Taking? Authorizing Provider  ARIPiprazole (ABILIFY) 10 MG tablet Take 1 tablet (10 mg total) by mouth at bedtime. For mood control 07/25/15   Sanjuana Kava, NP  benztropine (COGENTIN) 1 MG tablet Take 1 tablet (1 mg total) by mouth 2 (two) times daily. For prevention of drug induced tremors 07/25/15   Sanjuana Kava, NP  citalopram (CELEXA) 40 MG tablet Take 1 tablet (40 mg total) by mouth daily. For depression 07/25/15   Sanjuana Kava, NP  haloperidol decanoate (HALDOL DECANOATE) 100 MG/ML injection Inject 1 mL (100 mg total) into the muscle every 28 (twenty-eight) days. (Due on 08-05-15): For mood control 08/05/15   Sanjuana Kava, NP  LORazepam (ATIVAN) 1 MG tablet Take 1 tablet (1 mg  total) by mouth 2 (two) times daily. For anxiety 07/25/15   Sanjuana Kava, NP  traZODone (DESYREL) 100 MG tablet Take 1 tablet (100 mg total) by mouth at bedtime. For sleep 07/25/15   Sanjuana Kava, NP  vitamin B-12 1000 MCG tablet Take 1 tablet (1,000 mcg total) by mouth daily. For low B-12 replacement 07/27/15   Sanjuana Kava, NP    Family History Family History  Problem Relation Age of Onset  . Depression Father   . Bipolar disorder Father   . Alcohol abuse Father     Social History Social History  Substance Use Topics  . Smoking status: Never Smoker  . Smokeless tobacco: Never Used  . Alcohol use No     Allergies   Patient has no known allergies.   Review of Systems Review of Systems  Constitutional: Negative for chills and fever.  Respiratory: Negative for shortness of breath.   Cardiovascular: Negative for chest pain.  Gastrointestinal: Negative for constipation, diarrhea, nausea and vomiting.  Genitourinary: Negative for dysuria.  Musculoskeletal: Positive for arthralgias.  Skin: Positive for wound.     Physical Exam Updated Vital Signs BP 125/79   Pulse 62   Temp 98.3 F (36.8 C) (Oral)   Resp 18   Ht 5\' 5"  (1.651 m)   Wt 63.5 kg  SpO2 100%   BMI 23.30 kg/m   Physical Exam Nursing note and vitals reviewed.  Constitutional: Pt appears well-developed and well-nourished. No distress.  HENT:  Head: Normocephalic and atraumatic.  Eyes: Conjunctivae are normal.  Neck: Normal range of motion.  Cardiovascular: Normal rate, regular rhythm. Intact distal pulses.   Capillary refill < 3 sec.  Pulmonary/Chest: Effort normal and breath sounds normal.  Musculoskeletal:  Right little finger Pt exhibits tenderness to palpation, no visible bony abnormality.   ROM: 4/5 limited by pain  Strength: 4/5 limited by pain  Neurological: Pt  is alert. Coordination normal.  Sensation: 5/5 Skin: Skin is warm and dry. Pt is not diaphoretic.  Semicircular laceration to  right small finger on the palmar surface of the distal phalanx Psychiatric: Pt has a normal mood and affect.     ED Treatments / Results  Labs (all labs ordered are listed, but only abnormal results are displayed) Labs Reviewed - No data to display  EKG  EKG Interpretation None       Radiology Dg Finger Little Right  Result Date: 03/13/2016 CLINICAL DATA:  Rail fell on RIGHT little finger tonight at work. Pain and laceration. EXAM: RIGHT LITTLE FINGER 2+V COMPARISON:  None. FINDINGS: Acute transverse fracture through fifth distal phalanx without intra-articular extension, slight dorsal angulation of the distal bony fragments. No dislocation. No destructive bony lesions. Bandage overlies distal fifth finger without subcutaneous gas or radiopaque foreign bodies. IMPRESSION: Acute mildly displaced fracture fifth distal phalanx. No dislocation. Electronically Signed   By: Awilda Metro M.D.   On: 03/13/2016 04:31    Procedures Procedures (including critical care time) LACERATION REPAIR Performed by: Roxy Horseman Authorized by: Roxy Horseman Consent: Verbal consent obtained. Risks and benefits: risks, benefits and alternatives were discussed Consent given by: patient Patient identity confirmed: provided demographic data Prepped and Draped in normal sterile fashion Wound explored  Laceration Location: Right little finger  Laceration Length: 2 cm  No Foreign Bodies seen or palpated  Anesthesia: local infiltration and digital block  Local anesthetic: lidocaine 2% without epinephrine  Anesthetic total: 5 ml  Irrigation method: syringe Amount of cleaning: standard  Skin closure: 5-0 vicryl  Number of sutures: 3  Technique: interrupted  Patient tolerance: Patient tolerated the procedure well with no immediate complications.  Medications Ordered in ED Medications  lidocaine (PF) (XYLOCAINE) 1 % injection 5 mL (not administered)     Initial Impression /  Assessment and Plan / ED Course  I have reviewed the triage vital signs and the nursing notes.  Pertinent labs & imaging results that were available during my care of the patient were reviewed by me and considered in my medical decision making (see chart for details).     Patient with small laceration/skin tear to right small finger.  Mildly displaced underlying fracture, but I doubt communication from the laceration to the fracture.  The laceration is quite superficial and more of a skin tear.  Tdap is current.   Wound copiously irrigated after digital block.  Repaired by PA-student under my supervision.  Splint, wound care, and keflex.  Follow-up with PCP in a week.  Return precautions given.  Final Clinical Impressions(s) / ED Diagnoses   Final diagnoses:  Laceration of right little finger without foreign body without damage to nail, initial encounter  Closed displaced fracture of distal phalanx of right little finger, initial encounter    New Prescriptions New Prescriptions   CEPHALEXIN (KEFLEX) 500 MG CAPSULE    Take  1 capsule (500 mg total) by mouth 4 (four) times daily.   HYDROCODONE-ACETAMINOPHEN (NORCO/VICODIN) 5-325 MG TABLET    Take 1-2 tablets by mouth every 6 (six) hours as needed.     Roxy Horsemanobert Sereniti Wan, PA-C 03/13/16 0730    Layla MawKristen N Ward, DO 03/15/16 2304

## 2017-08-19 ENCOUNTER — Emergency Department (HOSPITAL_COMMUNITY): Admission: EM | Admit: 2017-08-19 | Discharge: 2017-08-19 | Discharge status: Against Medical Advice | Payer: Self-pay

## 2017-08-19 NOTE — ED Notes (Signed)
Pt called for triage and v/s x3 No response

## 2017-08-19 NOTE — ED Notes (Signed)
Bed: [facility] Expected date:  Expected time:  Means of arrival:  Comments: 

## 2017-08-19 NOTE — ED Notes (Signed)
Pt will not give consent for vitals.

## 2017-08-19 NOTE — ED Triage Notes (Signed)
Pt called for triage with no answer 

## 2017-08-19 NOTE — ED Triage Notes (Signed)
Pt called again from triage with no answer 

## 2017-08-19 NOTE — ED Triage Notes (Signed)
Pt refused to talk to the staff and tell us what he needed or wanted us to help him with, he would not answer any of our questions and got up and left the ED

## 2017-10-02 ENCOUNTER — Emergency Department (HOSPITAL_COMMUNITY)
Admission: EM | Admit: 2017-10-02 | Discharge: 2017-10-03 | Payer: Self-pay | Attending: Emergency Medicine | Admitting: Emergency Medicine

## 2017-10-02 ENCOUNTER — Other Ambulatory Visit: Payer: Self-pay

## 2017-10-02 DIAGNOSIS — R4689 Other symptoms and signs involving appearance and behavior: Secondary | ICD-10-CM | POA: Insufficient documentation

## 2017-10-02 NOTE — ED Triage Notes (Signed)
Pt arriving via [facility] and [employer]. Pt was found passed out on the side of the street. Refused to give any identification. Possible intoxication
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
## 2017-10-02 NOTE — ED Provider Notes (Signed)
[facility]EM[facility] DEPT Provider Note   CSN: [id number] Arrival date & time: 10/02/17  2233     History   Chief Complaint Chief Complaint  Patient presents with  . Medical Clearance    LEVEL 5 CAVEAT 2/2 PATIENT NONVERBAL/NONCOMMUNICATIVE  HPI Justin [name] is a 27 y.o. male.  27 y/o male with hx of schizophrenia and medication noncompliance found by police sleeping in a chair. Would not answer questions.  [employer] reporting unsteady gait, concerning for acute intoxication. Hx of catatonic-type behavior in the setting of medication noncompliance. Was discharged from the ED in stable condition on this occassion after screening evaluation; see chart for merge. Will not communicate any complaints. Is resistant to any interaction with myself and staff.   The history is provided by the patient. No language interpreter was used.    No past medical history on file.  There are no active problems to display for this patient.   ** The histories are not reviewed yet. Please review them in the "History" navigator section and refresh this SmartLink.      Home Medications    Prior to Admission medications   Not on File    Family History No family history on file.  Social History Social History   Tobacco Use  . Smoking status: Not on file  Substance Use Topics  . Alcohol use: Not on file  . Drug use: Not on file     Allergies   Patient has no allergy information on record.   Review of Systems Review of Systems  Unable to perform ROS: Patient nonverbal     Physical Exam Updated Vital Signs There were no vitals taken for this visit.  Physical Exam  Constitutional: He is oriented to person, place, and time. He appears well-developed and well-nourished. No distress.  Nontoxic appearing and in NAD  HENT:  Head: Normocephalic and atraumatic.  Eyes: Conjunctivae and EOM are normal. No scleral icterus.  Neck: Normal range of motion.    Cardiovascular: Normal rate and regular rhythm.  Pulmonary/Chest: Effort normal. No stridor. No respiratory distress.  Respirations even and unlabored  Musculoskeletal: Normal range of motion.  Neurological: He is alert and oriented to person, place, and time. He exhibits normal muscle tone. Coordination normal.  Moving all extremities spontaneously.  Skin: Skin is warm and dry. No rash noted. He is not diaphoretic. No erythema. No pallor.  Psychiatric: He has a normal mood and affect. His behavior is normal. He is noncommunicative.  Nursing note and vitals reviewed.    ED Treatments / Results  Labs (all labs ordered are listed, but only abnormal results are displayed) Labs Reviewed - No data to display  EKG None  Radiology No results found.  Procedures Procedures (including critical care time)  Medications Ordered in ED Medications - No data to display   Initial Impression / Assessment and Plan / ED Course  I have reviewed the triage vital signs and the nursing notes.  Pertinent labs & imaging results that were available during my care of the patient were reviewed by me and considered in my medical decision making (see chart for details).     27 year old male presents to the emergency department for medical screening.  Was found by [employer] sleeping in a chair.  Was presumed to be intoxicated.  Nonverbal on arrival, refusing to answer questions.  Had a similar episode for which she was seen in the emergency department in July 2017.  Is nontoxic-appearing and in no  distress.  Ambulates unassisted.  Has no tachycardia.  Saturations 100%.  I do not believe he requires prolonged observation or further emergent work-up.  Patient discharged from the department, escorted off property by [employer].   Final Clinical Impressions(s) / ED Diagnoses   Final diagnoses:  Change in behavior    ED Discharge Orders    None       Antony Madura, PA-C 10/03/17 0034    Dione Booze,  MD 10/03/17 [phone]

## 2017-10-02 NOTE — ED Notes (Signed)
Bed: [facility] Expected date:  Expected time:  Means of arrival:  Comments: EMS male Justin [name]-picked up by [employer]-alert/non-verbal
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
## 2018-03-14 ENCOUNTER — Emergency Department (HOSPITAL_COMMUNITY)
Admission: EM | Admit: 2018-03-14 | Discharge: 2018-03-14 | Disposition: A | Discharge status: Home / Self Care | Payer: Medicaid Other | Attending: Emergency Medicine | Admitting: Emergency Medicine

## 2018-03-14 ENCOUNTER — Encounter (HOSPITAL_COMMUNITY): Payer: Self-pay

## 2018-03-14 DIAGNOSIS — R600 Localized edema: Secondary | ICD-10-CM | POA: Insufficient documentation

## 2018-03-14 DIAGNOSIS — M7989 Other specified soft tissue disorders: Secondary | ICD-10-CM

## 2018-03-14 DIAGNOSIS — Z79899 Other long term (current) drug therapy: Secondary | ICD-10-CM | POA: Insufficient documentation

## 2018-03-14 MED ORDER — IBUPROFEN 400 MG PO TABS
600.0000 mg | ORAL_TABLET | Freq: Once | ORAL | Status: AC
Start: 2018-03-14 — End: 2018-03-14
  Administered 2018-03-14: 600 mg via ORAL
  Filled 2018-03-14: qty 1

## 2018-03-14 NOTE — Discharge Instructions (Signed)
Please read instructions below. Apply ice to your feet for 20 minutes at a time. Elevate them as much as possible. You can take ibuprofen or tylenol every 6 hours as needed for pain. Schedule an appointment with primary care provider if symptoms persist.

## 2018-03-14 NOTE — ED Triage Notes (Signed)
Pt presents for evaluation of foot swelling. States swelling and pain is bilateral but reports he believes he was bit by a spider 1 week ago. Reports that L foot is the side he noticed the bite mark on first.

## 2018-03-14 NOTE — ED Provider Notes (Signed)
[facility] EMERGENCY DEPARTMENT Provider Note   CSN: [id number] Arrival date & time: 03/14/18  1445     History   Chief Complaint Chief Complaint  Patient presents with  . Foot Swelling    HPI Justin [name]. is a 28 y.o. male presenting to the emergency department with complaint of intermittent bilateral foot swelling.  Patient states he thinks he was bitten by a spider 1 week ago on his left foot, however did not see a spider.  He thinks it may have been a spider because he saw a black dot on his foot.  He states he has had swelling to both of his feet, thinks his feet look yellow.  Patient states he must have been bitten by a spider on both feet.  No interventions tried prior to arrival.  Pain is worse with ambulating.  States he works at [employer], does not stand up for long periods of time, however he is on his feet for some of the day.  No fevers or redness.    No cardiac history.  The history is provided by the patient.    Past Medical History:  Diagnosis Date  . Abnormal behavior   . Homelessness   . No significant past medical history     Patient Active Problem List   Diagnosis Date Noted  . Schizophrenia, paranoid type (HCC) 07/18/2015  . Acute psychosis (HCC)   . Mild cognitive disorder 02/01/2015  . Hx of epistaxis 02/01/2015  . Vitamin B12 deficiency 01/22/2015  . 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 No significant past medical history     History reviewed. No pertinent surgical history.      Home Medications    Prior to Admission medications   Medication Sig Start Date End Date Taking? Authorizing Provider  ARIPiprazole (ABILIFY) 10 MG tablet Take 1 tablet (10 mg total) by mouth at bedtime. For mood control 07/25/15   Armandina Stammer I, NP  benztropine (COGENTIN) 1 MG tablet Take 1 tablet (1 mg total) by mouth 2 (two) times daily. For prevention of drug induced tremors 07/25/15   Armandina Stammer I, NP  cephALEXin (KEFLEX) 500 MG capsule  Take 1 capsule (500 mg total) by mouth 4 (four) times daily. 03/13/16   Roxy Horseman, PA-C  citalopram (CELEXA) 40 MG tablet Take 1 tablet (40 mg total) by mouth daily. For depression 07/25/15   Armandina Stammer I, NP  haloperidol decanoate (HALDOL DECANOATE) 100 MG/ML injection Inject 1 mL (100 mg total) into the muscle every 28 (twenty-eight) days. (Due on 08-05-15): For mood control 08/05/15   Armandina Stammer I, NP  HYDROcodone-acetaminophen (NORCO/VICODIN) 5-325 MG tablet Take 1-2 tablets by mouth every 6 (six) hours as needed. 03/13/16   Roxy Horseman, PA-C  LORazepam (ATIVAN) 1 MG tablet Take 1 tablet (1 mg total) by mouth 2 (two) times daily. For anxiety 07/25/15   Armandina Stammer I, NP  traZODone (DESYREL) 100 MG tablet Take 1 tablet (100 mg total) by mouth at bedtime. For sleep 07/25/15   Armandina Stammer I, NP  vitamin B-12 1000 MCG tablet Take 1 tablet (1,000 mcg total) by mouth daily. For low B-12 replacement 07/27/15   Sanjuana Kava, NP    Family History Family History  Problem Relation Age of Onset  . Depression Father   . Bipolar disorder Father   . Alcohol abuse Father     Social History Social History   Tobacco Use  . Smoking status: Never Smoker  .  Smokeless tobacco: Never Used  Substance Use Topics  . Alcohol use: No  . Drug use: No     Allergies   Patient has no known allergies.   Review of Systems Review of Systems  Constitutional: Negative for fever.  Musculoskeletal: Positive for myalgias.     Physical Exam Updated Vital Signs BP 114/72 (BP Location: Right Arm)   Pulse (!) 105   Temp 98.4 F (36.9 C) (Oral)   Resp 16   SpO2 100%   Physical Exam Vitals signs and nursing note reviewed.  Constitutional:      General: He is not in acute distress.    Appearance: He is well-developed.  HENT:     Head: Normocephalic and atraumatic.  Eyes:     Conjunctiva/sclera: Conjunctivae normal.  Cardiovascular:     Rate and Rhythm: Normal rate.  Pulmonary:      Effort: Pulmonary effort is normal.  Musculoskeletal:     Comments: Intact PT pulses bilaterally.  Patient unwilling to allow me to fully examine his feet, however appeared to have mild pedal edema bilaterally.  Swelling does not extend into the lower leg.  No redness or color change.  No wounds or rashes.  Normal sensation to toes.  Neurological:     Mental Status: He is alert.  Psychiatric:        Mood and Affect: Mood normal.        Behavior: Behavior normal.      ED Treatments / Results  Labs (all labs ordered are listed, but only abnormal results are displayed) Labs Reviewed - No data to display  EKG None  Radiology No results found.  Procedures Procedures (including critical care time)  Medications Ordered in ED Medications  ibuprofen (ADVIL,MOTRIN) tablet 600 mg (has no administration in time range)     Initial Impression / Assessment and Plan / ED Course  I have reviewed the triage vital signs and the nursing notes.  Pertinent labs & imaging results that were available during my care of the patient were reviewed by me and considered in my medical decision making (see chart for details).     Patient with intermittent bilateral pedal edema x1 week.  No cardiac history.  Exam is reassuring, however patient unwilling to allow palpation on exam.  No skin changes, wounds, or rashes.  Low suspicion for infectious cause.  Recommend elevation, compression, NSAIDs.  Treated in the ED with ibuprofen.  Safe for discharge.  Discussed results, findings, treatment and follow up. Patient advised of return precautions. Patient verbalized understanding and agreed with plan.   Final Clinical Impressions(s) / ED Diagnoses   Final diagnoses:  Foot swelling    ED Discharge Orders    None       Jalaiya Oyster, [place] N, PA-C 03/14/18 1616    Pricilla Loveless, MD 03/15/18 [phone]

## 2018-03-15 ENCOUNTER — Other Ambulatory Visit: Payer: Self-pay

## 2018-03-15 ENCOUNTER — Emergency Department (HOSPITAL_COMMUNITY)
Admission: EM | Admit: 2018-03-15 | Discharge: 2018-03-15 | Disposition: A | Discharge status: Home / Self Care | Payer: Medicaid Other | Attending: Emergency Medicine | Admitting: Emergency Medicine

## 2018-03-15 ENCOUNTER — Encounter (HOSPITAL_COMMUNITY): Payer: Self-pay | Admitting: Emergency Medicine

## 2018-03-15 ENCOUNTER — Emergency Department (HOSPITAL_COMMUNITY): Payer: Medicaid Other

## 2018-03-15 DIAGNOSIS — Y9289 Other specified places as the place of occurrence of the external cause: Secondary | ICD-10-CM | POA: Insufficient documentation

## 2018-03-15 DIAGNOSIS — W57XXXD Bitten or stung by nonvenomous insect and other nonvenomous arthropods, subsequent encounter: Secondary | ICD-10-CM | POA: Diagnosis not present

## 2018-03-15 DIAGNOSIS — S90861D Insect bite (nonvenomous), right foot, subsequent encounter: Secondary | ICD-10-CM | POA: Diagnosis not present

## 2018-03-15 DIAGNOSIS — Z79899 Other long term (current) drug therapy: Secondary | ICD-10-CM | POA: Insufficient documentation

## 2018-03-15 DIAGNOSIS — Y9389 Activity, other specified: Secondary | ICD-10-CM | POA: Diagnosis not present

## 2018-03-15 DIAGNOSIS — Y999 Unspecified external cause status: Secondary | ICD-10-CM | POA: Insufficient documentation

## 2018-03-15 DIAGNOSIS — T63481A Toxic effect of venom of other arthropod, accidental (unintentional), initial encounter: Secondary | ICD-10-CM

## 2018-03-15 DIAGNOSIS — S90861A Insect bite (nonvenomous), right foot, initial encounter: Secondary | ICD-10-CM

## 2018-03-15 DIAGNOSIS — W57XXXA Bitten or stung by nonvenomous insect and other nonvenomous arthropods, initial encounter: Secondary | ICD-10-CM

## 2018-03-15 MED ORDER — DIPHENHYDRAMINE HCL 25 MG PO TABS: 25.0000 mg | ORAL_TABLET | Freq: Four times a day (QID) | ORAL | 0 refills | Status: DC

## 2018-03-15 MED ORDER — DIPHENHYDRAMINE HCL 25 MG PO CAPS
25.0000 mg | ORAL_CAPSULE | Freq: Once | ORAL | Status: AC
  Administered 2018-03-15: 25 mg via ORAL
  Filled 2018-03-15: qty 1

## 2018-03-15 NOTE — Discharge Instructions (Signed)
It appears that you are having a local reaction to an insect bite. Your x-ray shows no foreign body in your foot and no infection of the bone. Elevate your foot as often as possible to reduce the swelling and take the Benadryl for swelling and allergic reaction. Follow up with your doctor in the next few days for recheck.

## 2018-03-15 NOTE — ED Provider Notes (Signed)
[name]PT Provider Note   CSN: [id number] Arrival date & time: 03/15/18  2028     History   Chief Complaint Chief Complaint  Patient presents with  . Insect Bite    HPI Justin [name]. is a 28 y.o. male with hx of Schizophrenia and psychosis who presents to the ED for possible spider bite to the right foot. Patient brought in by his mother. Patient was evaluated at [facility] ED yesterday with c/o spider bite to bilateral feet. 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 Today he tells me he is currently not working. He did not actually see anything that may have bitten him. Patient reports having a scab initially and pulling it off. No fever or drainage from the foot.   HPI  Past Medical History:  Diagnosis Date  . Abnormal behavior   . Homelessness   . No significant past medical history     Patient Active Problem List   Diagnosis Date Noted  . Schizophrenia, paranoid type (HCC) 07/18/2015  . Acute psychosis (HCC)   . Mild cognitive disorder 02/01/2015  . Hx of epistaxis 02/01/2015  . Vitamin B12 deficiency 01/22/2015  . 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 No significant past medical history     History reviewed. No pertinent surgical history.      Home Medications    Prior to Admission medications   Medication Sig Start Date End Date Taking? Authorizing Provider  ARIPiprazole (ABILIFY) 10 MG tablet Take 1 tablet (10 mg total) by mouth at bedtime. For mood control 07/25/15   Armandina Stammer I, NP  benztropine (COGENTIN) 1 MG tablet Take 1 tablet (1 mg total) by mouth 2 (two) times daily. For prevention of drug induced tremors 07/25/15   Armandina Stammer I, NP  citalopram (CELEXA) 40 MG tablet Take 1 tablet (40 mg total) by mouth daily. For depression 07/25/15   Armandina Stammer I, NP  diphenhydrAMINE (BENADRYL) 25 MG tablet Take 1 tablet (25 mg total) by mouth every 6 (six) hours. 03/15/18    Janne Napoleon, NP  haloperidol decanoate (HALDOL DECANOATE) 100 MG/ML injection Inject 1 mL (100 mg total) into the muscle every 28 (twenty-eight) days. (Due on 08-05-15): For mood control 08/05/15   Nwoko, Nicole Kindred I, NP  LORazepam (ATIVAN) 1 MG tablet Take 1 tablet (1 mg total) by mouth 2 (two) times daily. For anxiety 07/25/15   Armandina Stammer I, NP  traZODone (DESYREL) 100 MG tablet Take 1 tablet (100 mg total) by mouth at bedtime. For sleep 07/25/15   Armandina Stammer I, NP  vitamin B-12 1000 MCG tablet Take 1 tablet (1,000 mcg total) by mouth daily. For low B-12 replacement 07/27/15   Sanjuana Kava, NP    Family History Family History  Problem Relation Age of Onset  . Depression Father   . Bipolar disorder Father   . Alcohol abuse Father     Social History Social History   Tobacco Use  . Smoking status: Never Smoker  . Smokeless tobacco: Never Used  Substance Use Topics  . Alcohol use: No  . Drug use: No     Allergies   Patient has no known allergies.   Review of Systems Review of Systems  Musculoskeletal: Positive for arthralgias.       Right foot pain  Skin: Positive for wound.  All other systems reviewed and are negative.    Physical Exam Updated Vital  Signs BP 120/81 (BP Location: Left Arm)   Pulse 71   Temp 99 F (37.2 C) (Oral)   Resp 18   Ht 5\' 6"  (1.676 m)   Wt 65.8 kg   SpO2 100%   BMI 23.40 kg/m   Physical Exam Vitals signs and nursing note reviewed.  Constitutional:      General: He is not in acute distress.    Appearance: He is well-developed.  HENT:     Head: Normocephalic.     Nose: Nose normal.     Mouth/Throat:     Mouth: Mucous membranes are moist.  Eyes:     Conjunctiva/sclera: Conjunctivae normal.  Neck:     Musculoskeletal: Neck supple.  Cardiovascular:     Rate and Rhythm: Normal rate.  Pulmonary:     Effort: Pulmonary effort is normal.  Abdominal:     Palpations: Abdomen is soft.     Tenderness: There is no abdominal tenderness.    Musculoskeletal: Normal range of motion.     Right foot: Normal range of motion and normal capillary refill. Tenderness present. No deformity or laceration. Swelling: minimal.       Feet:     Comments: There is a tiny area at the ball of the right foot that is a possible insect bite with minimal swelling and no erythema, no red streaking. There is not ankle or lower leg edema.   Skin:    General: Skin is warm and dry.  Neurological:     Mental Status: He is alert and oriented to person, place, and time.     Cranial Nerves: No cranial nerve deficit.      ED Treatments / Results  Labs (all labs ordered are listed, but only abnormal results are displayed) Labs Reviewed - No data to display  Radiology Dg Foot Complete Right  Result Date: 03/15/2018 CLINICAL DATA:  Patient was bitten by spider on plantar portion of right foot, per patient, two days ago. Patient foot is slightly swollen. EXAM: RIGHT FOOT COMPLETE - 3+ VIEW COMPARISON:  None. FINDINGS: Alignment is normal. No acute or suspicious osseous lesion. Soft tissues about the RIGHT foot are unremarkable. No soft tissue gas seen. IMPRESSION: Negative. Electronically Signed   By: Bary Richard M.D.   On: 03/15/2018 21:57    Procedures Procedures (including critical care time)  Medications Ordered in ED Medications - No data to display   Initial Impression / Assessment and Plan / ED Course  I have reviewed the triage vital signs and the nursing notes. 28 y.o. male here with possible insect bite to the right foot stable for d/c without signs of infection. Will treat with Benadryl for possible local reaction to insect bite. Patient to f/u with PCP.   Final Clinical Impressions(s) / ED Diagnoses   Final diagnoses:  Insect bite of right foot, initial encounter  Local reaction to insect sting, accidental or unintentional, initial encounter    ED Discharge Orders         Ordered    diphenhydrAMINE (BENADRYL) 25 MG tablet  Every  6 hours     03/15/18 2230           Kerrie [place], [place] 03/15/18 2235    Raeford Razor, MD 03/16/18 [phone]

## 2018-03-15 NOTE — ED Triage Notes (Signed)
Patient foot was bitten by spider per patient two days ago. Patient foot is swollen a little. Patient states he pulled the scab off when it first happen.

## 2018-03-18 ENCOUNTER — Ambulatory Visit (HOSPITAL_COMMUNITY)
Admission: RE | Admit: 2018-03-18 | Discharge: 2018-03-18 | Disposition: A | Discharge status: Home / Self Care | Payer: No Typology Code available for payment source | Attending: Psychiatry | Admitting: Psychiatry

## 2018-03-18 ENCOUNTER — Other Ambulatory Visit: Payer: Self-pay

## 2018-03-18 ENCOUNTER — Emergency Department (HOSPITAL_COMMUNITY)
Admission: EM | Admit: 2018-03-18 | Discharge: 2018-03-19 | Disposition: A | Discharge status: Other Acute Inpatient Hospital | Payer: Medicaid Other | Attending: Emergency Medicine | Admitting: Emergency Medicine

## 2018-03-18 DIAGNOSIS — R4689 Other symptoms and signs involving appearance and behavior: Secondary | ICD-10-CM

## 2018-03-18 DIAGNOSIS — F29 Unspecified psychosis not due to a substance or known physiological condition: Secondary | ICD-10-CM | POA: Insufficient documentation

## 2018-03-18 DIAGNOSIS — Z79899 Other long term (current) drug therapy: Secondary | ICD-10-CM | POA: Insufficient documentation

## 2018-03-18 DIAGNOSIS — Z590 Homelessness: Secondary | ICD-10-CM | POA: Insufficient documentation

## 2018-03-18 DIAGNOSIS — F200 Paranoid schizophrenia: Secondary | ICD-10-CM | POA: Insufficient documentation

## 2018-03-18 LAB — CBC WITH DIFFERENTIAL/PLATELET
ABS IMMATURE GRANULOCYTES: 0.01 10*3/uL (ref 0.00–0.07)
Basophils Absolute: 0.1 10*3/uL (ref 0.0–0.1)
Basophils Relative: 1 %
EOS PCT: 3 %
Eosinophils Absolute: 0.2 10*3/uL (ref 0.0–0.5)
HEMATOCRIT: 42.4 % (ref 39.0–52.0)
HEMOGLOBIN: 14.7 g/dL (ref 13.0–17.0)
Immature Granulocytes: 0 %
LYMPHS ABS: 3.6 10*3/uL (ref 0.7–4.0)
LYMPHS PCT: 56 %
MCH: 30.4 pg (ref 26.0–34.0)
MCHC: 34.7 g/dL (ref 30.0–36.0)
MCV: 87.8 fL (ref 80.0–100.0)
MONO ABS: 0.5 10*3/uL (ref 0.1–1.0)
Monocytes Relative: 7 %
NEUTROS ABS: 2.1 10*3/uL (ref 1.7–7.7)
Neutrophils Relative %: 33 %
Platelets: 229 10*3/uL (ref 150–400)
RBC: 4.83 MIL/uL (ref 4.22–5.81)
RDW: 12.9 % (ref 11.5–15.5)
WBC: 6.4 10*3/uL (ref 4.0–10.5)
nRBC: 0 % (ref 0.0–0.2)

## 2018-03-18 MED ORDER — ONDANSETRON 4 MG PO TBDP: 4.0000 mg | ORAL_TABLET | Freq: Four times a day (QID) | ORAL | Status: DC | PRN

## 2018-03-18 MED ORDER — METHOCARBAMOL 500 MG PO TABS: 500.0000 mg | ORAL_TABLET | Freq: Three times a day (TID) | ORAL | Status: DC | PRN

## 2018-03-18 MED ORDER — TRAZODONE HCL 100 MG PO TABS
100.0000 mg | ORAL_TABLET | Freq: Every day | ORAL | Status: DC
  Administered 2018-03-18: 100 mg via ORAL
  Filled 2018-03-18: qty 1

## 2018-03-18 MED ORDER — LOPERAMIDE HCL 2 MG PO CAPS: 2.0000 mg | ORAL_CAPSULE | ORAL | Status: DC | PRN

## 2018-03-18 MED ORDER — NAPROXEN 500 MG PO TABS: 500.0000 mg | ORAL_TABLET | Freq: Two times a day (BID) | ORAL | Status: DC | PRN

## 2018-03-18 MED ORDER — BENZTROPINE MESYLATE 1 MG PO TABS
1.0000 mg | ORAL_TABLET | Freq: Two times a day (BID) | ORAL | Status: DC
  Administered 2018-03-19: 1 mg via ORAL
  Filled 2018-03-18 (×2): qty 1

## 2018-03-18 MED ORDER — CITALOPRAM HYDROBROMIDE 10 MG PO TABS
40.0000 mg | ORAL_TABLET | Freq: Every day | ORAL | Status: DC
  Administered 2018-03-19: 40 mg via ORAL
  Filled 2018-03-18 (×2): qty 4

## 2018-03-18 MED ORDER — NICOTINE 21 MG/24HR TD PT24: 21.0000 mg | MEDICATED_PATCH | Freq: Every day | TRANSDERMAL | Status: DC

## 2018-03-18 MED ORDER — HYDROXYZINE HCL 25 MG PO TABS: 25.0000 mg | ORAL_TABLET | Freq: Four times a day (QID) | ORAL | Status: DC | PRN

## 2018-03-18 MED ORDER — TETANUS-DIPHTH-ACELL PERTUSSIS 5-2.5-18.5 LF-MCG/0.5 IM SUSP
0.5000 mL | Freq: Once | INTRAMUSCULAR | Status: AC
  Administered 2018-03-19: 0.5 mL via INTRAMUSCULAR
  Filled 2018-03-18: qty 0.5

## 2018-03-18 MED ORDER — ARIPIPRAZOLE 10 MG PO TABS
10.0000 mg | ORAL_TABLET | Freq: Every day | ORAL | Status: DC
  Filled 2018-03-18: qty 1

## 2018-03-18 MED ORDER — ACETAMINOPHEN 325 MG PO TABS
650.0000 mg | ORAL_TABLET | ORAL | Status: DC | PRN
  Administered 2018-03-18: 325 mg via ORAL
  Administered 2018-03-19 (×2): 650 mg via ORAL
  Filled 2018-03-18 (×3): qty 2

## 2018-03-18 MED ORDER — DICYCLOMINE HCL 20 MG PO TABS: 20.0000 mg | ORAL_TABLET | Freq: Four times a day (QID) | ORAL | Status: DC | PRN

## 2018-03-18 NOTE — H&P (Signed)
Behavioral Health Medical Screening Exam  [name]. is an 28 y.o. male.  Presents behavioral walking with mother.  Mother reports patient has been decompensating has not been taking medications as prescribed.  Reports a history of schizophrenia substance abuse use.  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 states patient has not been keeping follow-up appointments appointment to internal stimuli.  Total Time spent with patient: 15 minutes  Psychiatric Specialty Exam: Physical Exam  Cardiovascular: Normal rate.  Neurological: He is alert.  Psychiatric: He has a normal mood and affect. His behavior is normal.    Review of Systems  Psychiatric/Behavioral: Positive for hallucinations. The patient is nervous/anxious and has insomnia.     Blood pressure (!) 159/76, pulse (!) 107, temperature 98.5 F (36.9 C), temperature source Oral, resp. rate 18, SpO2 100 %.There is no height or weight on file to calculate BMI.  General Appearance: Disheveled  Eye Contact:  Fair  Speech:  Clear and Coherent  Volume:  Normal  Mood:  Anxious, Depressed and Irritable  Affect:  Non-Congruent  Thought Process:  Disorganized  Orientation:  NA  Thought Content:  Hallucinations: Auditory, Paranoid Ideation and Rumination  Suicidal Thoughts:  No  Homicidal Thoughts:  No  Memory:  Immediate;   Fair Recent;   Fair Remote;   Fair  Judgement:  Impaired  Insight:  Lacking  Psychomotor Activity:  NA  Concentration: Concentration: Poor  Recall:  Poor  Fund of Knowledge:Poor  Language: Fair  Akathisia:  No  Handed:  Right  AIMS (if indicated):     Assets:  Communication Skills Desire for Improvement Financial Resources/Insurance Social Support  Sleep:       Musculoskeletal: Strength & Muscle Tone: within normal limits Gait & Station: normal Patient leans: N/A  Blood pressure (!) 159/76, pulse (!) 107, temperature 98.5 F (36.9 C), temperature source Oral, resp.  rate 18, SpO2 100 %.  Recommendations:  Based on my evaluation the patient appears to have an emergency medical condition for which I recommend the patient be transferred to the emergency department for further evaluation.  Oneta Rack, NP 03/18/2018, 5:29 PM

## 2018-03-18 NOTE — BH Assessment (Signed)
Assessment Note  [name]. is an 28 y.o. male. Pt presents as walk in at [facility] with his mother.  Pt talking to himself, laughing inappropriately, unable to provide any information for TTS assessment.  Pt talked to himself throughout the time TTS in the assessment room.  Pt did refer to "them talking" at one point and when asked who was talking said it wasn't him and it wasn't [place] (mother or TTS)  All information from mother, Justin [name], who pt resides with.  Pt has schizophrenia and bipolar diagnosis was going to [facility] for medication including injections.  Stopped going last year, mother believes he has been off medication for about 6 months.  Pt mental status has deteriorated in the past week: mother reports he is not sleeping and she can here him up and moving around at night.  Mother received call from friends who saw pt laying on park bench in the rain.  Mother could not find pt for several days and finally found him today. Pt will not answer questions about SI or HI.  Mother reports pt has been saying things like "kill the bitch" and other violent or aggressive references.    Diagnosis: Schizophrenia  Past Medical History:  Past Medical History:  Diagnosis Date  . Abnormal behavior   . Homelessness   . No significant past medical history     History reviewed. No pertinent surgical history.  Family History:  Family History  Problem Relation Age of Onset  . Depression Father   . Bipolar disorder Father   . Alcohol abuse Father     Social History:  reports that he has never smoked. He has never used smokeless tobacco. He reports that he does not drink alcohol or use drugs.  Additional Social History:  Alcohol / Drug Use Pain Medications: unknown Prescriptions: unknown Over the Counter: unknown History of alcohol / drug use?: Yes Substance #1 Name of Substance 1: Pt unable to provide information: mother reports "track marks" on his feet and believes pt is using  something.  mother reports history of marijuana use.  CIWA: CIWA-Ar BP: 122/84 Pulse Rate: 70 COWS:    Allergies: No Known Allergies  Home Medications: (Not in a hospital admission)   OB/GYN Status:  No LMP for male patient.  General Assessment Data Location of Assessment: [facility] Assessment Services TTS Assessment: In system Is this a Tele or Face-to-Face Assessment?: Face-to-Face Is this an Initial Assessment or a Re-assessment for this encounter?: Initial Assessment Patient Accompanied by:: Parent Language Other than English: No What gender do you identify as?: Male Marital status: Single Living Arrangements: Parent Can pt return to current living arrangement?: Yes Admission Status: Voluntary Is patient capable of signing voluntary admission?: No Referral Source: Self/Family/Friend     Crisis Care Plan Living Arrangements: Parent Name of Psychiatrist: [facility]     Risk to self with the past 6 months Suicidal Ideation: (unknown) Has patient been a risk to self within the past 6 months prior to admission? : No Suicidal Intent: (unknown) Has patient had any suicidal intent within the past 6 months prior to admission? : No Is patient at risk for suicide?: (unknown) Suicidal Plan?: (unknown) Has patient had any suicidal plan within the past 6 months prior to admission? : No Access to Means: (unknown) What has been your use of drugs/alcohol within the last 12 months?: unknown Previous Attempts/Gestures: Yes How many times?: 2 Triggers for Past Attempts: Unknown Intentional Self Injurious Behavior: None Family  Suicide History: Yes(grandmother) Recent stressful life event(s): (none reported) Persecutory voices/beliefs?: Yes Depression: No Substance abuse history and/or treatment for substance abuse?: Yes  Risk to Others within the past 6 months Homicidal Ideation: (unknown) Does patient have any lifetime risk of violence toward others beyond the six months prior to  admission? : Yes (comment) Thoughts of Harm to Others: (unknown) Current Homicidal Intent: (unknown) Current Homicidal Plan: (unknown) Access to Homicidal Means: (unknown) Identified Victim: (unknown) History of harm to others?: Yes Assessment of Violence: In past 6-12 months Violent Behavior Description: extensive criminal history including assault Does patient have access to weapons?: (unknown) Criminal Charges Pending?: No(per mom, all legal matters are resolved) Does patient have a court date: No Is patient on probation?: No  Psychosis Hallucinations: Auditory Delusions: Persecutory  Mental Status Report Appearance/Hygiene: Disheveled, Poor hygiene Eye Contact: Poor Motor Activity: Restlessness Speech: Word salad, Incoherent Level of Consciousness: Restless Mood: Labile Affect: Preoccupied Anxiety Level: None Thought Processes: Unable to Assess, Irrelevant Judgement: Impaired Orientation: Person, Place Obsessive Compulsive Thoughts/Behaviors: None  Cognitive Functioning Concentration: Unable to Assess Memory: Recent Impaired, Remote Impaired Is patient IDD: No Insight: Unable to Assess Impulse Control: Unable to Assess Appetite: Good Have you had any weight changes? : No Change Sleep: Decreased Total Hours of Sleep: (mother reports pt up most of the night for past week) Vegetative Symptoms: Not bathing, Decreased grooming  ADLScreening [facility] Assessment Services) Patient's cognitive ability adequate to safely complete daily activities?: No Patient able to express need for assistance with ADLs?: No Independently performs ADLs?: No  Prior Inpatient Therapy Prior Inpatient Therapy: Yes Prior Therapy Dates: 2017 Prior Therapy Facilty/Provider(s): [facility] Reason for Treatment: psych  Prior Outpatient Therapy Prior Outpatient Therapy: Yes Prior Therapy Dates: 2019 Prior Therapy Facilty/Provider(s): [facility] Reason for Treatment: psych Does patient have an ACCT  team?: No Does patient have Intensive In-House Services?  : No Does patient have [facility] services? : Yes Does patient have P4CC services?: No  ADL Screening (condition at time of admission) Patient's cognitive ability adequate to safely complete daily activities?: No Patient able to express need for assistance with ADLs?: No Independently performs ADLs?: No             Advance Directives (For Healthcare) Does Patient Have a Medical Advance Directive?: No Would patient like information on creating a medical advance directive?: No - Patient declined          Disposition: TTS spoke with Hillery Jacks, NP, who recommends inpt treatment after medical clearance at [facility]. TTS spoke to Aundra Millet, Consulting civil engineer at [employer] who will expect pt for medical clearance.   Disposition Initial Assessment Completed for this Encounter: Yes  On Site Evaluation by:   Reviewed with Physician:    Lorri Frederick 03/18/2018 5:23 PM

## 2018-03-18 NOTE — ED Provider Notes (Signed)
[place] DEPT Provider Note   CSN: [id number] Arrival date & time: 03/18/18  1808    History   Chief Complaint Chief Complaint  Patient presents with  . Medical Clearance    HPI Justin [name]. is a 28 y.o. male.     HPI  Patient is a 28 year old male with a history of schizophrenia presenting for abnormal behavior.  He was brought in by his mother who initially presented to [place], where it was determined that he met inpatient criteria and needed to come to the hospital for medical clearance.  According to the note from [facility], patient was brought in by his mother because he is not taking his medication.  Per her report he has been "injecting heroin in his feet".  Patient is extremely difficult to get a history from.  He is responding to internal stimuli and any question he begins to swear at this Probation officer.  He keeps repeating "I have died already".  Level 5 caveat psychosis.   Past Medical History:  Diagnosis Date  . Abnormal behavior   . Homelessness   . No significant past medical history     Patient Active Problem List   Diagnosis Date Noted  . Schizophrenia, paranoid type ([place]) 07/18/2015  . Acute psychosis ([place])   . Mild cognitive disorder 02/01/2015  . Hx of epistaxis 02/01/2015  . Vitamin B12 deficiency 01/22/2015  . 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 No significant past medical history     No past surgical history on file.      Home Medications    Prior to Admission medications   Medication Sig Start Date End Date Taking? Authorizing Provider  ARIPiprazole (ABILIFY) 10 MG tablet Take 1 tablet (10 mg total) by mouth at bedtime. For mood control 07/25/15   Lindell Spar I, NP  benztropine (COGENTIN) 1 MG tablet Take 1 tablet (1 mg total) by mouth 2 (two) times daily. For prevention of drug induced tremors 07/25/15   Lindell Spar I, NP  citalopram (CELEXA) 40 MG tablet Take 1 tablet (40 mg total) by mouth daily. For  depression 07/25/15   Lindell Spar I, NP  diphenhydrAMINE (BENADRYL) 25 MG tablet Take 1 tablet (25 mg total) by mouth every 6 (six) hours. 03/15/18   Ashley Murrain, NP  haloperidol decanoate (HALDOL DECANOATE) 100 MG/ML injection Inject 1 mL (100 mg total) into the muscle every 28 (twenty-eight) days. (Due on 08-05-15): For mood control 08/05/15   Nwoko, Herbert Pun I, NP  LORazepam (ATIVAN) 1 MG tablet Take 1 tablet (1 mg total) by mouth 2 (two) times daily. For anxiety 07/25/15   Lindell Spar I, NP  traZODone (DESYREL) 100 MG tablet Take 1 tablet (100 mg total) by mouth at bedtime. For sleep 07/25/15   Lindell Spar I, NP  vitamin B-12 1000 MCG tablet Take 1 tablet (1,000 mcg total) by mouth daily. For low B-12 replacement 07/27/15   Encarnacion Slates, NP    Family History Family History  Problem Relation Age of Onset  . Depression Father   . Bipolar disorder Father   . Alcohol abuse Father     Social History Social History   Tobacco Use  . Smoking status: Never Smoker  . Smokeless tobacco: Never Used  Substance Use Topics  . Alcohol use: No  . Drug use: No     Allergies   Patient has no known allergies.   Review of Systems Review of Systems  Psychiatric/Behavioral: Positive  for behavioral problems, confusion and hallucinations. The patient is hyperactive.    Unable to perform ROS secondary to psychosis.  Physical Exam Updated Vital Signs BP (!) 125/99 (BP Location: Right Arm)   Pulse (!) 104   Temp 99.2 F (37.3 C) (Oral)   Resp 18   Ht _0  (1.676 m)   Wt 66.2 kg   SpO2 100%   BMI 23.57 kg/m   Physical Exam Vitals signs and nursing note reviewed.  Constitutional:      General: He is not in acute distress.    Appearance: He is well-developed.  HENT:     Head: Normocephalic and atraumatic.  Eyes:     Extraocular Movements: Extraocular movements intact.     Conjunctiva/sclera: Conjunctivae normal.     Pupils: Pupils are equal, round, and reactive to light.  Neck:      Musculoskeletal: Normal range of motion and neck supple.  Cardiovascular:     Rate and Rhythm: Normal rate and regular rhythm.     Heart sounds: S1 normal and S2 normal. No murmur.  Pulmonary:     Effort: Pulmonary effort is normal.     Breath sounds: Normal breath sounds. No wheezing or rales.  Abdominal:     General: There is no distension.  Musculoskeletal: Normal range of motion.        General: No deformity.  Lymphadenopathy:     Cervical: No cervical adenopathy.  Skin:    General: Skin is warm and dry.     Findings: No erythema or rash.     Comments: Bilateral feet examined.  No erythema.  Patient does have a small blistering lesion on dorsum of left foot.  Neurological:     Mental Status: He is alert.     Comments: Cranial nerves grossly intact. Patient moves extremities symmetrically and with good coordination.  Psychiatric:     Comments: Patient appears ill groomed and unkempt.  He is agitated.  He is actively swearing at this Probation officer during evaluation.  He appears to be responding to internal stimuli.      ED Treatments / Results  Labs (all labs ordered are listed, but only abnormal results are displayed) Labs Reviewed  COMPREHENSIVE METABOLIC PANEL - Abnormal; Notable for the following components:      Result Value   Glucose, Bld 123 (*)    All other components within normal limits  ACETAMINOPHEN LEVEL - Abnormal; Notable for the following components:   Acetaminophen (Tylenol), Serum <10 (*)    All other components within normal limits  ETHANOL  CBC WITH DIFFERENTIAL/PLATELET  SALICYLATE LEVEL  RAPID URINE DRUG SCREEN, HOSP PERFORMED    EKG None  Radiology No results found.  Procedures Procedures (including critical care time)  Medications Ordered in ED Medications - No data to display   Initial Impression / Assessment and Plan / ED Course  I have reviewed the triage vital signs and the nursing notes.  Pertinent labs & imaging results that were  available during my care of the patient were reviewed by me and considered in my medical decision making (see chart for details).  Clinical Course as of Mar 19 99  Wed Mar 19, 2018  0030 Normal labs.    [AM]    Clinical Course User Index [AM] Albesa Seen, PA-C       Patient is nontoxic-appearing, but appears psychiatrically unstable with responding to internal stimuli.  He appears agitated.  Appears the patient has not been taking his home  medications.  There is a report from patient's mother per the [facility] notes that patient has been injecting heroin into his feet.  Patient may be starting to withdraw if this is true.  Will place patient on the opiate withdrawal protocol.  Home medications are ordered. QT is normal.   At this time, lab work is pending.  Patient is accepted at [facility]. Will continue to follow lab work for full medical clearance.  Final Clinical Impressions(s) / ED Diagnoses   Final diagnoses:  Change in behavior  Psychosis, unspecified psychosis type [facility])    ED Discharge Orders    None       Tamala Julian 03/19/18 0115    Hayden Rasmussen, MD 03/20/18 [phone]

## 2018-03-18 NOTE — ED Notes (Signed)
Bed: [facility]PT4 Expected date:  Expected time:  Means of arrival:  Comments: 

## 2018-03-18 NOTE — ED Notes (Signed)
Patient changed into paper scrubs and wanded by security. 

## 2018-03-18 NOTE — ED Triage Notes (Addendum)
Pt to room #39, limited assessment d/t pt is not answering assessment questions appropriately. Pt noted mumbling and singing to self. Appears to be responding to internal stimuli. EDP notified that pt is on unit.

## 2018-03-18 NOTE — Progress Notes (Signed)
Received Justin [name] in his room with his head under the covers talking out loud to himself. His thoughts were  to disorganized  to talk with the Registration Dagoberto Reef and the Pharmacy Technician. The PA requested assistance from security because he was not receptive to her. He refused his Celexa, Cogentin and Abilify - PO medications.  His mother encouraged him over the phone to be compliant with his medications. He was medicated with Ativan and eventually went to sleep after 12 midnight. He was cooperative with the  EKG and tetanus injection.

## 2018-03-19 ENCOUNTER — Encounter (HOSPITAL_COMMUNITY): Payer: Self-pay | Admitting: *Deleted

## 2018-03-19 ENCOUNTER — Other Ambulatory Visit: Payer: Self-pay

## 2018-03-19 ENCOUNTER — Inpatient Hospital Stay (HOSPITAL_COMMUNITY)
Admission: AD | Admit: 2018-03-19 | Discharge: 2018-03-25 | DRG: 885 | Disposition: A | Discharge status: Home / Self Care | Payer: Medicaid Other | Source: Intra-hospital | Attending: Psychiatry | Admitting: Psychiatry

## 2018-03-19 DIAGNOSIS — G4700 Insomnia, unspecified: Secondary | ICD-10-CM | POA: Diagnosis present

## 2018-03-19 DIAGNOSIS — F200 Paranoid schizophrenia: Secondary | ICD-10-CM | POA: Diagnosis not present

## 2018-03-19 DIAGNOSIS — I959 Hypotension, unspecified: Secondary | ICD-10-CM | POA: Diagnosis present

## 2018-03-19 DIAGNOSIS — Z818 Family history of other mental and behavioral disorders: Secondary | ICD-10-CM

## 2018-03-19 DIAGNOSIS — Z9114 Patient's other noncompliance with medication regimen: Secondary | ICD-10-CM

## 2018-03-19 DIAGNOSIS — Z79899 Other long term (current) drug therapy: Secondary | ICD-10-CM

## 2018-03-19 HISTORY — DX: Schizophrenia, unspecified: F20.9

## 2018-03-19 LAB — COMPREHENSIVE METABOLIC PANEL
ALBUMIN: 4.7 g/dL (ref 3.5–5.0)
ALK PHOS: 59 U/L (ref 38–126)
ALT: 11 U/L (ref 0–44)
ANION GAP: 7 (ref 5–15)
AST: 16 U/L (ref 15–41)
BUN: 6 mg/dL (ref 6–20)
CALCIUM: 9.2 mg/dL (ref 8.9–10.3)
CHLORIDE: 104 mmol/L (ref 98–111)
CO2: 29 mmol/L (ref 22–32)
Creatinine, Ser: 0.91 mg/dL (ref 0.61–1.24)
GFR calc Af Amer: >60 mL/min (ref >60)
GFR calc non Af Amer: >60 mL/min (ref >60)
Glucose, Bld: 123 mg/dL — ABNORMAL HIGH (ref 70–99)
Potassium: 3.9 mmol/L (ref 3.5–5.1)
SODIUM: 140 mmol/L (ref 135–145)
Total Bilirubin: 0.6 mg/dL (ref 0.3–1.2)
Total Protein: 7.6 g/dL (ref 6.5–8.1)

## 2018-03-19 LAB — RAPID URINE DRUG SCREEN, HOSP PERFORMED
AMPHETAMINES: NOT DETECTED
Barbiturates: NOT DETECTED
Benzodiazepines: NOT DETECTED
COCAINE: NOT DETECTED
Opiates: NOT DETECTED
TETRAHYDROCANNABINOL: NOT DETECTED

## 2018-03-19 LAB — ETHANOL: Alcohol, Ethyl (B): <10 mg/dL (ref <10)

## 2018-03-19 LAB — SALICYLATE LEVEL: Salicylate Lvl: <7 mg/dL (ref 2.8–30.0)

## 2018-03-19 LAB — ACETAMINOPHEN LEVEL: Acetaminophen (Tylenol), Serum: <10 ug/mL — ABNORMAL LOW (ref 10–30)

## 2018-03-19 MED ORDER — HALOPERIDOL 5 MG PO TABS
10.0000 mg | ORAL_TABLET | Freq: Once | ORAL | Status: AC
  Administered 2018-03-19: 10 mg via ORAL

## 2018-03-19 MED ORDER — ARIPIPRAZOLE 10 MG PO TABS
10.0000 mg | ORAL_TABLET | Freq: Every day | ORAL | Status: DC
  Administered 2018-03-19: 10 mg via ORAL
  Filled 2018-03-19 (×3): qty 1

## 2018-03-19 MED ORDER — DICYCLOMINE HCL 20 MG PO TABS: 20.0000 mg | ORAL_TABLET | Freq: Four times a day (QID) | ORAL | Status: AC | PRN

## 2018-03-19 MED ORDER — HALOPERIDOL 5 MG PO TABS: 5.0000 mg | ORAL_TABLET | Freq: Two times a day (BID) | ORAL | Status: DC

## 2018-03-19 MED ORDER — HALOPERIDOL DECANOATE 100 MG/ML IM SOLN
100.0000 mg | INTRAMUSCULAR | Status: DC
  Administered 2018-03-19: 100 mg via INTRAMUSCULAR
  Filled 2018-03-19 (×2): qty 1

## 2018-03-19 MED ORDER — LORAZEPAM 1 MG PO TABS
1.0000 mg | ORAL_TABLET | Freq: Two times a day (BID) | ORAL | Status: DC
  Administered 2018-03-19 – 2018-03-25 (×11): 1 mg via ORAL
  Filled 2018-03-19 (×11): qty 1

## 2018-03-19 MED ORDER — ACETAMINOPHEN 325 MG PO TABS: 650.0000 mg | ORAL_TABLET | ORAL | Status: DC | PRN

## 2018-03-19 MED ORDER — ONDANSETRON 4 MG PO TBDP: 4.0000 mg | ORAL_TABLET | Freq: Four times a day (QID) | ORAL | Status: AC | PRN

## 2018-03-19 MED ORDER — TRAZODONE HCL 100 MG PO TABS
100.0000 mg | ORAL_TABLET | Freq: Every day | ORAL | Status: DC
  Administered 2018-03-19 – 2018-03-23 (×4): 100 mg via ORAL
  Filled 2018-03-19 (×9): qty 1

## 2018-03-19 MED ORDER — ACETAMINOPHEN 325 MG PO TABS
650.0000 mg | ORAL_TABLET | Freq: Four times a day (QID) | ORAL | Status: DC | PRN
  Administered 2018-03-19 – 2018-03-25 (×15): 650 mg via ORAL
  Filled 2018-03-19 (×17): qty 2

## 2018-03-19 MED ORDER — HALOPERIDOL 5 MG PO TABS
10.0000 mg | ORAL_TABLET | Freq: Two times a day (BID) | ORAL | Status: DC
  Filled 2018-03-19: qty 2

## 2018-03-19 MED ORDER — DIPHENHYDRAMINE HCL 25 MG PO CAPS
25.0000 mg | ORAL_CAPSULE | Freq: Four times a day (QID) | ORAL | Status: DC
  Administered 2018-03-19 – 2018-03-25 (×17): 25 mg via ORAL
  Filled 2018-03-19 (×29): qty 1

## 2018-03-19 MED ORDER — BENZTROPINE MESYLATE 1 MG PO TABS: 1.0000 mg | ORAL_TABLET | Freq: Two times a day (BID) | ORAL | Status: DC

## 2018-03-19 MED ORDER — BENZTROPINE MESYLATE 1 MG PO TABS
1.0000 mg | ORAL_TABLET | Freq: Two times a day (BID) | ORAL | Status: DC
  Administered 2018-03-19 – 2018-03-25 (×11): 1 mg via ORAL
  Filled 2018-03-19 (×15): qty 1

## 2018-03-19 MED ORDER — HALOPERIDOL 5 MG PO TABS
10.0000 mg | ORAL_TABLET | Freq: Two times a day (BID) | ORAL | Status: DC
  Administered 2018-03-20 – 2018-03-22 (×4): 10 mg via ORAL
  Filled 2018-03-19 (×8): qty 2

## 2018-03-19 MED ORDER — ALUM & MAG HYDROXIDE-SIMETH 200-200-20 MG/5ML PO SUSP: 30.0000 mL | ORAL | Status: DC | PRN

## 2018-03-19 MED ORDER — CITALOPRAM HYDROBROMIDE 10 MG PO TABS
10.0000 mg | ORAL_TABLET | Freq: Every day | ORAL | Status: DC
  Administered 2018-03-20 – 2018-03-22 (×2): 10 mg via ORAL
  Filled 2018-03-19 (×5): qty 1

## 2018-03-19 MED ORDER — CITALOPRAM HYDROBROMIDE 10 MG PO TABS
10.0000 mg | ORAL_TABLET | Freq: Every day | ORAL | Status: DC
  Filled 2018-03-19: qty 1

## 2018-03-19 MED ORDER — LORAZEPAM 1 MG PO TABS
1.0000 mg | ORAL_TABLET | Freq: Once | ORAL | Status: AC
  Administered 2018-03-19: 1 mg via ORAL
  Filled 2018-03-19: qty 1

## 2018-03-19 MED ORDER — CITALOPRAM HYDROBROMIDE 40 MG PO TABS: 40.0000 mg | ORAL_TABLET | Freq: Every day | ORAL | Status: DC

## 2018-03-19 MED ORDER — HYDROXYZINE HCL 25 MG PO TABS: 25.0000 mg | ORAL_TABLET | Freq: Four times a day (QID) | ORAL | Status: AC | PRN

## 2018-03-19 MED ORDER — TRAZODONE HCL 100 MG PO TABS: 100.0000 mg | ORAL_TABLET | Freq: Every day | ORAL | Status: DC

## 2018-03-19 MED ORDER — METHOCARBAMOL 500 MG PO TABS
500.0000 mg | ORAL_TABLET | Freq: Three times a day (TID) | ORAL | Status: AC | PRN
  Administered 2018-03-20 – 2018-03-23 (×8): 500 mg via ORAL
  Filled 2018-03-19 (×9): qty 1

## 2018-03-19 MED ORDER — MAGNESIUM HYDROXIDE 400 MG/5ML PO SUSP: 30.0000 mL | Freq: Every day | ORAL | Status: DC | PRN

## 2018-03-19 MED ORDER — LOPERAMIDE HCL 2 MG PO CAPS: 2.0000 mg | ORAL_CAPSULE | ORAL | Status: AC | PRN

## 2018-03-19 MED ORDER — NAPROXEN 500 MG PO TABS
500.0000 mg | ORAL_TABLET | Freq: Two times a day (BID) | ORAL | Status: AC | PRN
  Administered 2018-03-21 – 2018-03-23 (×3): 500 mg via ORAL
  Filled 2018-03-19 (×3): qty 1

## 2018-03-19 MED ORDER — VITAMIN B-12 1000 MCG PO TABS
1000.0000 ug | ORAL_TABLET | Freq: Every day | ORAL | Status: DC
  Administered 2018-03-20 – 2018-03-25 (×5): 1000 ug via ORAL
  Filled 2018-03-19 (×7): qty 1

## 2018-03-19 NOTE — ED Notes (Signed)
Report called to [employer] at behavioral health.  Patient not to arrive until 6pm.

## 2018-03-19 NOTE — Progress Notes (Signed)
Justin [name] is a 28 year old male pt admitted on involuntary basis. On admission, he reports that his mother had him committed. He denies SI/HI or A/V hallucinations. He does report that he has been off his medications for awhile and when asked why he stopped he spoke about how he was working and could not get to his appointments. He does report that he is willing to get back on his medications while he is here. He denies any substance abuse issues. He reports that he lives with his mother and reports that he will return there once he is discharged. Jibreel was escorted to the unit, oriented to the milieu and safety maintained.

## 2018-03-19 NOTE — ED Notes (Addendum)
Patient awakened to give medication.  Patient calm, cooperative.  Took po medication without difficulty. Patient allowing tech to obtain VS.

## 2018-03-19 NOTE — Progress Notes (Signed)
Psychoeducational Group Note  Date:  03/19/2018 Time:  2144  Group Topic/Focus:  Wrap-Up Group:   The focus of this group is to help patients review their daily goal of treatment and discuss progress on daily workbooks.  Participation Level: Did Not Attend  Participation Quality:  Not Applicable  Affect:  Not Applicable  Cognitive:  Not Applicable  Insight:  Not Applicable  Engagement in Group: Not Applicable  Additional Comments:  The patient was asleep and could not attend group.   Hazle Coca S 03/19/2018, 9:44 PM

## 2018-03-19 NOTE — Tx Team (Signed)
Initial Treatment Plan 03/19/2018 7:12 PM Justin [name] Justin [name]. KWI:[id number]    PATIENT STRESSORS: Marital or family conflict Medication change or noncompliance   PATIENT STRENGTHS: Ability for insight Average or above average intelligence Capable of independent living General fund of knowledge   PATIENT IDENTIFIED PROBLEMS: Schizophrenia "Yeah I wanna get back on my medicine"                     DISCHARGE CRITERIA:  Ability to meet basic life and health needs Improved stabilization in mood, thinking, and/or behavior Verbal commitment to aftercare and medication compliance  PRELIMINARY DISCHARGE PLAN: Attend aftercare/continuing care group Return to previous living arrangement  PATIENT/FAMILY INVOLVEMENT: This treatment plan has been presented to and reviewed with the patient, Justin [name] Justin [name]., and/or family member, .  The patient and family have been given the opportunity to ask questions and make suggestions.  Stefannie Defeo, [place], [place] 03/19/2018, 7:12 PM

## 2018-03-19 NOTE — ED Notes (Signed)
PER TTS ASSESSMENT/DISPOSITION COMPLETED AT 5:30pm on 03/18/18: TTS spoke with Hillery Jacks, NP, who recommends inpt treatment after medical clearance at [facility]. TTS spoke to Aundra Millet, Consulting civil engineer at [employer] who will expect pt for medical clearance.    Per Hassie Bruce, no appropriate beds at this time. TTS to seek placement. Randa Evens, RN, informed of disposition.

## 2018-03-19 NOTE — Progress Notes (Signed)
D: Pt denies SI/HI/AVH. Pt is pleasant and cooperative. Pt very paranoid . Pt kept to his room most of the evening. Pt given Haldol Decanoate shot this evening.   A: Pt was offered support and encouragement. Pt was given scheduled medications . Pt was encourage to attend groups. Q 15 minute checks were done for safety.   R: safety maintained on unit.  Problem: Activity: Goal: Sleeping patterns will improve Outcome: Progressing   Problem: Health Behavior/Discharge Planning: Goal: Compliance with treatment plan for underlying cause of condition will improve Outcome: Progressing

## 2018-03-19 NOTE — Consult Note (Addendum)
[facility] Face-to-Face Psychiatry Consult   Reason for Consult:  Psychosis  Referring Physician:  EDP Patient Identification: Justin [name]. MRN:  [id number] Principal Diagnosis: Schizophrenia, paranoid type (HCC) Diagnosis:  Principal Problem:   Schizophrenia, paranoid type (HCC)  Total Time spent with patient: 45 minutes  Subjective:   Justin Maxim Jeri [name]. is a 28 y.o. male patient admitted with psychosis.  HPI:  On assessment per his mother:  Justin Sandau. is an 28 y.o. male. Pt presents as walk in at [facility] with his mother.  Pt talking to himself, laughing inappropriately, unable to provide any information for TTS assessment.  Pt talked to himself throughout the time TTS in the assessment room.  Pt did refer to "them talking" at one point and when asked who was talking said it wasn't him and it wasn't [place] (mother or TTS)  All information from mother, Justin [name], who pt resides with.  Pt has schizophrenia and bipolar diagnosis was going to [facility] for medication including injections.  Stopped going last year, mother believes he has been off medication for about 6 months.  Pt mental status has deteriorated in the past week: mother reports he is not sleeping and she can hear him up and moving around at night.  Mother received call from friends who saw pt laying on park bench in the rain.  Mother could not find pt for several days and finally found him today. Pt will not answer questions about SI or HI.  Mother reports pt has been saying things like "kill the bitch" and other violent or aggressive references.    Past Psychiatric History: schizophrenia  Risk to Self:  none Risk to Others:  mild Prior Inpatient Therapy:  yes Prior Outpatient Therapy:  [facility]  Past Medical History:  Past Medical History:  Diagnosis Date  . Abnormal behavior   . Homelessness   . No significant past medical history    No past surgical history on file. Family History:  Family History   Problem Relation Age of Onset  . Depression Father   . Bipolar disorder Father   . Alcohol abuse Father    Family Psychiatric  History: see above Social History:  Social History   Substance and Sexual Activity  Alcohol Use No     Social History   Substance and Sexual Activity  Drug Use No    Social History   Socioeconomic History  . Marital status: Single    Spouse name: Not on file  . Number of children: Not on file  . Years of education: Not on file  . Highest education level: Not on file  Occupational History  . Not on file  Social Needs  . Financial resource strain: Not on file  . Food insecurity:    Worry: Not on file    Inability: Not on file  . Transportation needs:    Medical: Not on file    Non-medical: Not on file  Tobacco Use  . Smoking status: Never Smoker  . Smokeless tobacco: Never Used  Substance and Sexual Activity  . Alcohol use: No  . Drug use: No  . Sexual activity: Not on file  Lifestyle  . Physical activity:    Days per week: Not on file    Minutes per session: Not on file  . Stress: Not on file  Relationships  . Social connections:    Talks on phone: Not on file    Gets together: Not on  file    Attends religious service: Not on file    Active member of club or organization: Not on file    Attends meetings of clubs or organizations: Not on file    Relationship status: Not on file  Other Topics Concern  . Not on file  Social History Narrative   ** Merged History Encounter **       Additional Social History: N/A    Allergies:  No Known Allergies  Labs:  Results for orders placed or performed during the hospital encounter of 03/18/18 (from the past 48 hour(s))  Urine rapid drug screen (hosp performed)     Status: None   Collection Time: 03/18/18  9:33 PM  Result Value Ref Range   Opiates NONE DETECTED NONE DETECTED   Cocaine NONE DETECTED NONE DETECTED   Benzodiazepines NONE DETECTED NONE DETECTED   Amphetamines NONE  DETECTED NONE DETECTED   Tetrahydrocannabinol NONE DETECTED NONE DETECTED   Barbiturates NONE DETECTED NONE DETECTED    Comment: (NOTE) DRUG SCREEN FOR MEDICAL PURPOSES ONLY.  IF CONFIRMATION IS NEEDED FOR ANY PURPOSE, NOTIFY LAB WITHIN 5 DAYS. LOWEST DETECTABLE LIMITS FOR URINE DRUG SCREEN Drug Class                     Cutoff (ng/mL) Amphetamine and metabolites    1000 Barbiturate and metabolites    200 Benzodiazepine                 200 Tricyclics and metabolites     300 Opiates and metabolites        300 Cocaine and metabolites        300 THC                            50 Performed at [facility], [address], [place], [place] [address]   Comprehensive metabolic panel     Status: Abnormal   Collection Time: 03/18/18 11:45 PM  Result Value Ref Range   Sodium 140 135 - 145 mmol/L   Potassium 3.9 3.5 - 5.1 mmol/L   Chloride 104 98 - 111 mmol/L   CO2 29 22 - 32 mmol/L   Glucose, Bld 123 (H) 70 - 99 mg/dL   BUN 6 6 - 20 mg/dL   Creatinine, Ser 6.04 0.61 - 1.24 mg/dL   Calcium 9.2 8.9 - 54.0 mg/dL   Total Protein 7.6 6.5 - 8.1 g/dL   Albumin 4.7 3.5 - 5.0 g/dL   AST 16 15 - 41 U/L   ALT 11 0 - 44 U/L   Alkaline Phosphatase 59 38 - 126 U/L   Total Bilirubin 0.6 0.3 - 1.2 mg/dL   GFR calc non Af Amer >60 >60 mL/min   GFR calc Af Amer >60 >60 mL/min   Anion gap 7 5 - 15    Comment: Performed at [facility], [address] [place], [place] [address]  Ethanol     Status: None   Collection Time: 03/18/18 11:45 PM  Result Value Ref Range   Alcohol, Ethyl (B) <10 <10 mg/dL    Comment: (NOTE) Lowest detectable limit for serum alcohol is 10 mg/dL. For medical purposes only. Performed at [facility], [address], [place], [place] [address]   CBC with Diff     Status: None   Collection Time: 03/18/18 11:45 PM  Result Value Ref Range   WBC 6.4 4.0 -  10.5 K/uL   RBC 4.83 4.22 - 5.81 MIL/uL   Hemoglobin 14.7  13.0 - 17.0 g/dL   HCT 16.142.4 09.639.0 - 04.552.0 %   MCV 87.8 80.0 - 100.0 fL   MCH 30.4 26.0 - 34.0 pg   MCHC 34.7 30.0 - 36.0 g/dL   RDW 40.912.9 81.111.5 - 91.415.5 %   Platelets 229 150 - 400 K/uL   nRBC 0.0 0.0 - 0.2 %   Neutrophils Relative % 33 %   Neutro Abs 2.1 1.7 - 7.7 K/uL   Lymphocytes Relative 56 %   Lymphs Abs 3.6 0.7 - 4.0 K/uL   Monocytes Relative 7 %   Monocytes Absolute 0.5 0.1 - 1.0 K/uL   Eosinophils Relative 3 %   Eosinophils Absolute 0.2 0.0 - 0.5 K/uL   Basophils Relative 1 %   Basophils Absolute 0.1 0.0 - 0.1 K/uL   Immature Granulocytes 0 %   Abs Immature Granulocytes 0.01 0.00 - 0.07 K/uL    Comment: Performed at [facility], [address] [place], [place] [address]  Acetaminophen level     Status: Abnormal   Collection Time: 03/18/18 11:45 PM  Result Value Ref Range   Acetaminophen (Tylenol), Serum <10 (L) 10 - 30 ug/mL    Comment: (NOTE) Therapeutic concentrations vary significantly. A range of 10-30 ug/mL  may be an effective concentration for many patients. However, some  are best treated at concentrations outside of this range. Acetaminophen concentrations >150 ug/mL at 4 hours after ingestion  and >50 ug/mL at 12 hours after ingestion are often associated with  toxic reactions. Performed at [facility], [address] [place], [place] [address]   Salicylate level     Status: None   Collection Time: 03/18/18 11:45 PM  Result Value Ref Range   Salicylate Lvl <7.0 2.8 - 30.0 mg/dL    Comment: Performed at [facility], [address] [place], [place] [address]    Current Facility-Administered Medications  Medication Dose Route Frequency Provider Last Rate Last Dose  . acetaminophen (TYLENOL) tablet 650 mg  650 mg Oral Q4H PRN Aviva KluverMurray, Alyssa B, PA-C   650 mg at 03/19/18 1004  . benztropine (COGENTIN) tablet 1 mg  1 mg Oral BID [place] ScrapeMurray, Alyssa B, PA-C   1 mg at 03/19/18 0956  . citalopram (CELEXA)  tablet 40 mg  40 mg Oral Daily Murray, Alyssa B, PA-C   40 mg at 03/19/18 0956  . dicyclomine (BENTYL) tablet 20 mg  20 mg Oral Q6H PRN [place] ScrapeMurray, Alyssa B, PA-C      . haloperidol (HALDOL) tablet 5 mg  5 mg Oral BID Charm RingsLord, Jamison Y, NP      . hydrOXYzine (ATARAX/VISTARIL) tablet 25 mg  25 mg Oral Q6H PRN [place] ScrapeMurray, Alyssa B, PA-C      . loperamide (IMODIUM) capsule 2-4 mg  2-4 mg Oral PRN Murray, Alyssa B, PA-C      . methocarbamol (ROBAXIN) tablet 500 mg  500 mg Oral Q8H PRN Murray, Alyssa B, PA-C      . naproxen (NAPROSYN) tablet 500 mg  500 mg Oral BID PRN Murray, Alyssa B, PA-C      . ondansetron (ZOFRAN-ODT) disintegrating tablet 4 mg  4 mg Oral Q6H PRN Murray, Alyssa B, PA-C      . traZODone (DESYREL) tablet 100 mg  100 mg Oral QHS Murray, Alyssa B, PA-C   100 mg at 03/18/18 2239   Current Outpatient Medications  Medication  Sig Dispense Refill  . ARIPiprazole (ABILIFY) 10 MG tablet Take 1 tablet (10 mg total) by mouth at bedtime. For mood control 30 tablet 0  . benztropine (COGENTIN) 1 MG tablet Take 1 tablet (1 mg total) by mouth 2 (two) times daily. For prevention of drug induced tremors 30 tablet 0  . citalopram (CELEXA) 40 MG tablet Take 1 tablet (40 mg total) by mouth daily. For depression 30 tablet 0  . diphenhydrAMINE (BENADRYL) 25 MG tablet Take 1 tablet (25 mg total) by mouth every 6 (six) hours. 20 tablet 0  . haloperidol decanoate (HALDOL DECANOATE) 100 MG/ML injection Inject 1 mL (100 mg total) into the muscle every 28 (twenty-eight) days. (Due on 08-05-15): For mood control 1 mL 0  . LORazepam (ATIVAN) 1 MG tablet Take 1 tablet (1 mg total) by mouth 2 (two) times daily. For anxiety 6 tablet 0  . traZODone (DESYREL) 100 MG tablet Take 1 tablet (100 mg total) by mouth at bedtime. For sleep 30 tablet 0  . vitamin B-12 1000 MCG tablet Take 1 tablet (1,000 mcg total) by mouth daily. For low B-12 replacement 30 tablet 0    Musculoskeletal: Strength & Muscle Tone: within normal  limits Gait & Station: normal Patient leans: N/A  Psychiatric Specialty Exam: Physical Exam  Nursing note and vitals reviewed. Constitutional: He appears well-developed and well-nourished.  HENT:  Head: Normocephalic.  Neck: Normal range of motion.  Respiratory: Effort normal.  Musculoskeletal: Normal range of motion.  Neurological: He is alert.  Psychiatric: Thought content normal. His affect is blunt. His speech is delayed. He is actively hallucinating. Cognition and memory are impaired. He expresses impulsivity.    Review of Systems  Psychiatric/Behavioral: Positive for hallucinations. Negative for substance abuse.  All other systems reviewed and are negative.   Blood pressure (!) 125/99, pulse (!) 104, temperature 99.2 F (37.3 C), temperature source Oral, resp. rate 18, height 5\' 6"  (1.676 m), weight 66.2 kg, SpO2 100 %.Body mass index is 23.57 kg/m.  General Appearance: Casual  Eye Contact:  Good  Speech:  Blocked  Volume:  Decreased  Mood:  Irritable  Affect:  Blunt  Thought Process:  Coherent and Descriptions of Associations: Loose  Orientation:  Other:  person  Thought Content:  Hallucinations: Auditory Visual  Suicidal Thoughts:  No  Homicidal Thoughts:  No  Memory:  Immediate;   Poor Recent;   Poor Remote;   Poor  Judgement:  Impaired  Insight:  Lacking  Psychomotor Activity:  Decreased  Concentration:  Concentration: Poor and Attention Span: Poor  Recall:  Poor  Fund of Knowledge:  Fair  Language:  Poor  Akathisia:  No  Handed:  Right  AIMS (if indicated):   N/A  Assets:  Leisure Time Physical Health Resilience Social Support  ADL's:  Intact  Cognition:  Impaired,  Mild  Sleep:   N/A     Treatment Plan Summary: Daily contact with patient to assess and evaluate symptoms and progress in treatment, Medication management and Plan schizophrenia, paranoid type:  -Started Haldol 10 mg BID  EPS: -Started Cogentin 1 mg BID  Depression: -Started  Celexa at 10 mg since he has not been taking it  Insomnia: -Started Trazodone 100 mg at bedtime   Anxiety: -Started Vistaril 25 mg every six hours PRN   Disposition: Recommend psychiatric Inpatient admission when medically cleared.  Nanine MeansLORD, JAMISON, NP 03/19/2018 11:28 AM   Patient seen face-to-face for psychiatric evaluation, chart reviewed and case discussed with the physician  extender and developed treatment plan. Reviewed the information documented and agree with the treatment plan.  Juanetta Beets, DO 03/19/18 4:03 PM

## 2018-03-19 NOTE — BH Assessment (Signed)
[facility] Assessment Progress Note  Per Juanetta Beets, DO, this pt requires psychiatric hospitalization.  Lamount Cranker, RN, [facility] has assigned pt to [facility] Rm [id number]; [facility] will be ready to receive pt at 18:00.  Dr Sharma Covert also finds that pt meets criteria for IVC, which she has initiated.  IVC documents have been faxed to [facility], and at [employer] confirms receipt.  He has since faxed Findings and Custody Order to this Clinical research associate.  At 11:08 I called [employer] and spoke to [employer] , who took demographic information, agreeing to dispatch law enforcement to fill out Return of Service.  Law enforcement then presented at [facility], completing Return of Service, after which IVC documents were faxed to [facility].  Pt's nurse, Aram Beecham, has been notified, and agrees to call report to [phone].  Pt is to be transported via Patent examiner.   Doylene Canning, [place] Behavioral Health Coordinator [phone]

## 2018-03-19 NOTE — ED Notes (Addendum)
Patient given tylenol for bilateral foot pain.  Patient reports he was checked out yesterday by ED physician for this problem.

## 2018-03-20 MED ORDER — HALOPERIDOL 5 MG PO TABS
10.0000 mg | ORAL_TABLET | Freq: Every day | ORAL | Status: DC
  Administered 2018-03-20 – 2018-03-21 (×2): 10 mg via ORAL
  Filled 2018-03-20 (×3): qty 2

## 2018-03-20 NOTE — [facility] Group Notes (Signed)
[facility] LCSW Group Therapy Note  Date/Time: 03/20/18, 1315  Type of Therapy/Topic:  Group Therapy:  Balance in Life  Participation Level:  Did not attend  Description of Group:    This group will address the concept of balance and how it feels and looks when one is unbalanced. Patients will be encouraged to process areas in their lives that are out of balance, and identify reasons for remaining unbalanced. Facilitators will guide patients utilizing problem- solving interventions to address and correct the stressor making their life unbalanced. Understanding and applying boundaries will be explored and addressed for obtaining  and maintaining a balanced life. Patients will be encouraged to explore ways to assertively make their unbalanced needs known to significant others in their lives, using other group members and facilitator for support and feedback.  Therapeutic Goals: 1. Patient will identify two or more emotions or situations they have that consume much of in their lives. 2. Patient will identify signs/triggers that life has become out of balance:  3. Patient will identify two ways to set boundaries in order to achieve balance in their lives:  4. Patient will demonstrate ability to communicate their needs through discussion and/or role plays  Summary of Patient Progress:          Therapeutic Modalities:   Cognitive Behavioral Therapy Solution-Focused Therapy Assertiveness Training  Greg Sevyn Paredez, LCSW 

## 2018-03-20 NOTE — Progress Notes (Signed)
Recreation Therapy Notes  Date:  2. 20. 20 Time: 1000 Location:  [address] Dayroom  Group Topic: Communication, Team Building, Problem Solving  Goal Area(s) Addresses:  Patient will effectively work with peer towards shared goal.  Patient will identify skills used to make activity successful.  Patient will identify how skills used during activity can be used to reach post d/c goals.   Intervention: STEM Activity  Activity: Landing Pad. In teams patients were given 12 plastic drinking straws and a length of masking tape. Using the materials provided patients were asked to build a landing pad to catch a golf ball dropped from approximately 6 feet in the air.   Education: Social Skills, Discharge Planning   Education Outcome: Acknowledges education/In group clarification offered/Needs additional education.   Clinical Observations/Feedback:  Pt did not attend group.     Kathrynn Backstrom, LRT/CTRS         Justin [name] A 03/20/2018 10:59 AM 

## 2018-03-20 NOTE — Progress Notes (Signed)
Patient rated his day as a 4 out of 10. He informed the group that he does not feel comfortable with the clothes he is wearing. His goal for tomorrow is to have "more stability".

## 2018-03-20 NOTE — [facility] Group Notes (Signed)
Pt did not attend orientation group.  

## 2018-03-20 NOTE — BHH Suicide Risk Assessment (Signed)
[facility] Admission Suicide Risk Assessment   Nursing information obtained from:  Patient Demographic factors:  Male, Low socioeconomic status Current Mental Status:  NA Loss Factors:  NA Historical Factors:  Prior suicide attempts, Family history of mental illness or substance abuse Risk Reduction Factors:  Living with another person, especially a relative, Positive therapeutic relationship, Positive coping skills or problem solving skills  Total Time spent with patient: 45 minutes Principal Problem: Exacerbation and underlying psychotic disorder Diagnosis:  Active Problems:   Schizophrenia, paranoid type (HCC)  Subjective Data-recent heroin abuse recent exacerbation and psychotic disorder  Continued Clinical Symptoms:  Alcohol Use Disorder Identification Test Final Score (AUDIT): 1 The "Alcohol Use Disorders Identification Test", Guidelines for Use in Primary Care, Second Edition.  World Science writer [facility]). Score between 0-7:  no or low risk or alcohol related problems. Score between 8-15:  moderate risk of alcohol related problems. Score between 16-19:  high risk of alcohol related problems. Score 20 or above:  warrants further diagnostic evaluation for alcohol dependence and treatment.   CLINICAL FACTORS:   Schizophrenia:   Less than 9 years old     COGNITIVE FEATURES THAT CONTRIBUTE TO RISK:  Loss of executive function    SUICIDE RISK:   Minimal: No identifiable suicidal ideation.  Patients presenting with no risk factors but with morbid ruminations; may be classified as minimal risk based on the severity of the depressive symptoms  PLAN OF CARE: full   I certify that inpatient services furnished can reasonably be expected to improve the patient's condition.   Malvin Johns, MD 03/20/2018, 8:51 AM

## 2018-03-20 NOTE — [facility] Counselor (Signed)
Adult Comprehensive Assessment  Patient ID: Justin Hofacker [name]., male   DOB: 1990-09-08, 28 y.o.   MRN: [id number]  Information Source: Information source: Patient  Current Stressors:  Patient states their primary concerns and needs for treatment are:: Getting back on my medicine Patient states their goals for this hospitilization and ongoing recovery are:: No goal Educational / Learning stressors: Pt denies any stressors currently.    Living/Environment/Situation:  Living Arrangements: Parent Living conditions (as described by patient or guardian): goes fine Who else lives in the home?: mother How long has patient lived in current situation?: "all my life" What is atmosphere in current home: Comfortable, Supportive  Family History:  Marital status: Single Are you sexually active?: No What is your sexual orientation?: heterosexual Has your sexual activity been affected by drugs, alcohol, medication, or emotional stress?: na Does patient have children?: No  Childhood History:  By whom was/is the patient raised?: Mother Additional childhood history information: Father died before pt was born, raised by his mother.  Pt reports he had a good childhood.  Description of patient's relationship with caregiver when they were a child: mom: good, dad: deceased Patient's description of current relationship with people who raised him/her: mom:  good,  How were you disciplined when you got in trouble as a child/adolescent?: appropriate discipline Does patient have siblings?: Yes Number of Siblings: 3 Description of patient's current relationship with siblings: 2 sisters, 1 brother: good relationships Did patient suffer any verbal/emotional/physical/sexual abuse as a child?: No Did patient suffer from severe childhood neglect?: No Has patient ever been sexually abused/assaulted/raped as an adolescent or adult?: No Was the patient ever a victim of a crime or a disaster?: No Witnessed  domestic violence?: No Has patient been effected by domestic violence as an adult?: No  Education:  Highest grade of school patient has completed: HS diploma, some college/[employer] Currently a Consulting civil engineer?: No Learning disability?: No  Employment/Work Situation:   Employment situation: Employed Where is patient currently employed?: 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?: No Are There Guns or Other Weapons in Your Home?: No  Financial Resources:   Financial resources: Income from employment, Support from parents / caregiver Does patient have a Lawyer or guardian?: No  Alcohol/Substance Abuse:   What has been your use of drugs/alcohol within the last 12 months?: alcohol: pt denies, drugs: pt denies If attempted suicide, did drugs/alcohol play a role in this?: No Alcohol/Substance Abuse Treatment Hx: Denies past history Has alcohol/substance abuse ever caused legal problems?: No  Social Support System:   Conservation officer, nature Support System: Fair Museum/gallery exhibitions officer System: mother Type of faith/religion: none How does patient's faith help to cope with current illness?: na  Leisure/Recreation:   Leisure and Hobbies: relax, TV, "do things"  Strengths/Needs:   What is the patient's perception of their strengths?: "doing things, work things' Patient states they can use these personal strengths during their treatment to contribute to their recovery: pt unable to answer Patient states these barriers may affect/interfere with their treatment: none Patient states these barriers may affect their return to the community: none, "sometimes work gets in  the way" Other important information  patient would like considered in planning for their treatment: none  Discharge Plan:   Currently receiving community mental health services: Yes (From Whom)([facility]) Patient states concerns and preferences for aftercare planning are: Pt wants to have an ACT team. Patient states they will know when they are safe and ready for discharge when: "My mood and my actions" Does patient have access to transportation?: Yes Does patient have financial barriers related to discharge medications?: Yes Patient description of barriers related to discharge medications: no insurance Will patient be returning to same living situation after discharge?: Yes  Summary/Recommendations:   Summary and Recommendations (to be completed by the evaluator): Pt is 28 year old male from [place].  Pt is diagnosed with schizophrenia and was admitted due to disorganized behavior after getting off his medication. Recommendations for pt include crisis stabilization, therapeutic milieu, attend and participate in groups, medication management, and development of comprehensive mental wellness plan.    Lorri Frederick. 03/20/2018

## 2018-03-20 NOTE — H&P (Signed)
Psychiatric Admission Assessment Adult  Patient Identification: Justin [name]. MRN:  [id number] Date of Evaluation:  03/20/2018 Chief Complaint:  psychosis Principal Diagnosis: Exacerbation and underlying schizophrenic condition Diagnosis:  Active Problems:   Schizophrenia, paranoid type (HCC)  History of Present Illness:   Justin [name] is well-known to the healthcare system, well-known to this examiner due to prior encounters.  He carries a diagnosis of chronic paranoid schizophrenia, he has been stabilized on oral aripiprazole in addition to long-acting injectable Haldol.  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 The patient himself states that his problem is "having trouble speaking and hearing" so he is doing his best to describe his recent mutism. He reports auditory hallucinations recently then denies them he reports no visual hallucinations he tells me he does not want to harm himself.  He has no involuntary movements.  Drug screen is negative for all compounds Last QTC 427 ms - 2/19  Dr. Sharma Covert assessed him yesterday and her mental status exam is similar to mine, her history is as follows Justin Brandtis an 27 y.o.male.Pt presents as walk in at [facility] with his mother. Pt talking to himself, laughing inappropriately, unable to provide any information for TTS assessment. Pt talked to himself throughout the time TTS in the assessment room. Pt did refer to "them talking" at one point and when asked who was talking said it wasn't him and it wasn't Justin [name] (mother or TTS) All information from mother, Justin [name], who pt resides with. Pt has schizophrenia and bipolar diagnosis was going to  [facility] for medication including injections. Stopped going last year, mother believes he has been off medication for about 6 months. Pt mental status has deteriorated in the past week: mother reports he is not sleeping and she can hear him up and moving around at night. Mother received call from friends who saw pt laying on park bench in the rain. Mother could not find pt for several days and finally found him today. Pt will not answer questions about SI or HI. Mother reports pt has been saying things like "kill the bitch" and other violent or aggressive references.  Associated Signs/Symptoms: Depression Symptoms:  psychomotor retardation, (Hypo) Manic Symptoms:  Hallucinations, Impulsivity, Anxiety Symptoms:  n/a Psychotic Symptoms:  neg sxs PTSD Symptoms: NA Total Time spent with patient: 45 minutes  Is the patient at risk to self? Yes.    Has the patient been a risk to self in the past 6 months? No.  Has the patient been a risk to self within the distant past? Yes.    Is the patient a risk to others? Yes.    Has the patient been a risk to others in the past 6 months? No.  Has the patient been a risk to others within the distant past? Yes.     Prior Inpatient Therapy:   Prior Outpatient Therapy:    Alcohol Screening: 1. How often do you have a drink containing alcohol?: Monthly or less 2. How many drinks containing alcohol do you have on a typical day when you are drinking?: 1 or 2 3. How often do you have six or more drinks on one occasion?: Never AUDIT-C Score: 1 4. How often during the last year have you  found that you were not able to stop drinking once you had started?: Never 5. How often during the last year have you failed to do what was normally expected from you becasue of drinking?: Never 6. How often during the last year have you needed a first drink in the morning to get yourself going after a heavy drinking session?: Never 7. How often during the last year have you had  a feeling of guilt of remorse after drinking?: Never 8. How often during the last year have you been unable to remember what happened the night before because you had been drinking?: Never 9. Have you or someone else been injured as a result of your drinking?: No 10. Has a relative or friend or a doctor or another health worker been concerned about your drinking or suggested you cut down?: No Alcohol Use Disorder Identification Test Final Score (AUDIT): 1 Alcohol Brief Interventions/Follow-up: AUDIT Score <7 follow-up not indicated Substance Abuse History in the last 12 months:  Yes.   Consequences of Substance Abuse: NA Previous Psychotropic Medications: Yes  Psychological Evaluations: No  Past Medical History:  Past Medical History:  Diagnosis Date  . Abnormal behavior   . Homelessness   . No significant past medical history   . Schizophrenia (HCC)    History reviewed. No pertinent surgical history. Family History:  Family History  Problem Relation Age of Onset  . Depression Father   . Bipolar disorder Father   . Alcohol abuse Father     Tobacco Screening: Have you used any form of tobacco in the last 30 days? (Cigarettes, Smokeless Tobacco, Cigars, and/or Pipes): No Social History:  Social History   Substance and Sexual Activity  Alcohol Use No     Social History   Substance and Sexual Activity  Drug Use No    Additional Social History:                           Allergies:  No Known Allergies Lab Results:  Results for orders placed or performed during the hospital encounter of 03/18/18 (from the past 48 hour(s))  Urine rapid drug screen (hosp performed)     Status: None   Collection Time: 03/18/18  9:33 PM  Result Value Ref Range   Opiates NONE DETECTED NONE DETECTED   Cocaine NONE DETECTED NONE DETECTED   Benzodiazepines NONE DETECTED NONE DETECTED   Amphetamines NONE DETECTED NONE DETECTED   Tetrahydrocannabinol NONE DETECTED NONE DETECTED    Barbiturates NONE DETECTED NONE DETECTED    Comment: (NOTE) DRUG SCREEN FOR MEDICAL PURPOSES ONLY.  IF CONFIRMATION IS NEEDED FOR ANY PURPOSE, NOTIFY LAB WITHIN 5 DAYS. LOWEST DETECTABLE LIMITS FOR URINE DRUG SCREEN Drug Class                     Cutoff (ng/mL) Amphetamine and metabolites    1000 Barbiturate and metabolites    200 Benzodiazepine                 200 Tricyclics and metabolites     300 Opiates and metabolites        300 Cocaine and metabolites        300 THC                            50 Performed at [facility], [address] [place], [place] [address]   Comprehensive  metabolic panel     Status: Abnormal   Collection Time: 03/18/18 11:45 PM  Result Value Ref Range   Sodium 140 135 - 145 mmol/L   Potassium 3.9 3.5 - 5.1 mmol/L   Chloride 104 98 - 111 mmol/L   CO2 29 22 - 32 mmol/L   Glucose, Bld 123 (H) 70 - 99 mg/dL   BUN 6 6 - 20 mg/dL   Creatinine, Ser 8.59 0.61 - 1.24 mg/dL   Calcium 9.2 8.9 - 29.2 mg/dL   Total Protein 7.6 6.5 - 8.1 g/dL   Albumin 4.7 3.5 - 5.0 g/dL   AST 16 15 - 41 U/L   ALT 11 0 - 44 U/L   Alkaline Phosphatase 59 38 - 126 U/L   Total Bilirubin 0.6 0.3 - 1.2 mg/dL   GFR calc non Af Amer >60 >60 mL/min   GFR calc Af Amer >60 >60 mL/min   Anion gap 7 5 - 15    Comment: Performed at [facility], [address] [place], [place] [address]  Ethanol     Status: None   Collection Time: 03/18/18 11:45 PM  Result Value Ref Range   Alcohol, Ethyl (B) <10 <10 mg/dL    Comment: (NOTE) Lowest detectable limit for serum alcohol is 10 mg/dL. For medical purposes only. Performed at [facility], [address], [place], [place] [address]   CBC with Diff     Status: None   Collection Time: 03/18/18 11:45 PM  Result Value Ref Range   WBC 6.4 4.0 - 10.5 K/uL   RBC 4.83 4.22 - 5.81 MIL/uL   Hemoglobin 14.7 13.0 - 17.0 g/dL   HCT 71.1 65.7 - 90.3 %   MCV 87.8 80.0 - 100.0 fL   MCH  30.4 26.0 - 34.0 pg   MCHC 34.7 30.0 - 36.0 g/dL   RDW 83.3 38.3 - 29.1 %   Platelets 229 150 - 400 K/uL   nRBC 0.0 0.0 - 0.2 %   Neutrophils Relative % 33 %   Neutro Abs 2.1 1.7 - 7.7 K/uL   Lymphocytes Relative 56 %   Lymphs Abs 3.6 0.7 - 4.0 K/uL   Monocytes Relative 7 %   Monocytes Absolute 0.5 0.1 - 1.0 K/uL   Eosinophils Relative 3 %   Eosinophils Absolute 0.2 0.0 - 0.5 K/uL   Basophils Relative 1 %   Basophils Absolute 0.1 0.0 - 0.1 K/uL   Immature Granulocytes 0 %   Abs Immature Granulocytes 0.01 0.00 - 0.07 K/uL    Comment: Performed at [facility], [address], [place], [place] [address]  Acetaminophen level     Status: Abnormal   Collection Time: 03/18/18 11:45 PM  Result Value Ref Range   Acetaminophen (Tylenol), Serum <10 (L) 10 - 30 ug/mL    Comment: (NOTE) Therapeutic concentrations vary significantly. A range of 10-30 ug/mL  may be an effective concentration for many patients. However, some  are best treated at concentrations outside of this range. Acetaminophen concentrations >150 ug/mL at 4 hours after ingestion  and >50 ug/mL at 12 hours after ingestion are often associated with  toxic reactions. Performed at [facility], [address], [place], [place] [address]   Salicylate level     Status: None   Collection Time: 03/18/18 11:45 PM  Result Value Ref Range   Salicylate Lvl <7.0 2.8 - 30.0 mg/dL    Comment: Performed at [facility],  [address] [place], [place] [address]    Blood Alcohol level:  Lab Results  Component Value Date   [facility]10 03/18/2018   ETH <5 07/08/2015    Metabolic Disorder Labs:  Lab Results  Component Value Date   HGBA1C 4.7 (L) 07/19/2015   MPG 88 07/19/2015   No results found for: PROLACTIN Lab Results  Component Value Date   CHOL 167 07/19/2015   TRIG 60 07/19/2015   HDL 42 07/19/2015   CHOLHDL 4.0 07/19/2015   VLDL 12 07/19/2015   LDLCALC 113 (H)  07/19/2015    Current Medications: Current Facility-Administered Medications  Medication Dose Route Frequency Provider Last Rate Last Dose  . acetaminophen (TYLENOL) tablet 650 mg  650 mg Oral Q6H PRN Charm RingsLord, Jamison Y, NP   650 mg at 03/20/18 0818  . alum & mag hydroxide-simeth (MAALOX/MYLANTA) 200-200-20 MG/5ML suspension 30 mL  30 mL Oral Q4H PRN Charm RingsLord, Jamison Y, NP      . benztropine (COGENTIN) tablet 1 mg  1 mg Oral BID Charm RingsLord, Jamison Y, NP   1 mg at 03/20/18 0816  . citalopram (CELEXA) tablet 10 mg  10 mg Oral Daily Charm RingsLord, Jamison Y, NP   10 mg at 03/20/18 0816  . dicyclomine (BENTYL) tablet 20 mg  20 mg Oral Q6H PRN Charm RingsLord, Jamison Y, NP      . diphenhydrAMINE (BENADRYL) capsule 25 mg  25 mg Oral Q6H Charm RingsLord, Jamison Y, NP   25 mg at 03/20/18 [id number]  . haloperidol (HALDOL) tablet 10 mg  10 mg Oral BID Charm RingsLord, Jamison Y, NP   10 mg at 03/20/18 [id number]  . haloperidol (HALDOL) tablet 10 mg  10 mg Oral QHS Malvin JohnsFarah, Brittanyann Wittner, MD      . haloperidol decanoate (HALDOL DECANOATE) 100 MG/ML injection 100 mg  100 mg Intramuscular Q28 days Charm RingsLord, Jamison Y, NP   100 mg at 03/19/18 2117  . hydrOXYzine (ATARAX/VISTARIL) tablet 25 mg  25 mg Oral Q6H PRN Charm RingsLord, Jamison Y, NP      . loperamide (IMODIUM) capsule 2-4 mg  2-4 mg Oral PRN Charm RingsLord, Jamison Y, NP      . LORazepam (ATIVAN) tablet 1 mg  1 mg Oral BID Charm RingsLord, Jamison Y, NP   1 mg at 03/20/18 0818  . magnesium hydroxide (MILK OF MAGNESIA) suspension 30 mL  30 mL Oral Daily PRN Charm RingsLord, Jamison Y, NP      . methocarbamol (ROBAXIN) tablet 500 mg  500 mg Oral Q8H PRN Charm RingsLord, Jamison Y, NP   500 mg at 03/20/18 [id number]  . naproxen (NAPROSYN) tablet 500 mg  500 mg Oral BID PRN Charm RingsLord, Jamison Y, NP      . ondansetron (ZOFRAN-ODT) disintegrating tablet 4 mg  4 mg Oral Q6H PRN Charm RingsLord, Jamison Y, NP      . traZODone (DESYREL) tablet 100 mg  100 mg Oral QHS Charm RingsLord, Jamison Y, NP   100 mg at 03/19/18 2110  . vitamin B-12 (CYANOCOBALAMIN) tablet 1,000 mcg  1,000 mcg Oral Daily Charm RingsLord, Jamison Y, NP    1,000 mcg at 03/20/18 [id number]   PTA Medications: Medications Prior to Admission  Medication Sig Dispense Refill Last Dose  . ARIPiprazole (ABILIFY) 10 MG tablet Take 1 tablet (10 mg total) by mouth at bedtime. For mood control 30 tablet 0 unknown  . benztropine (COGENTIN) 1 MG tablet Take 1 tablet (1 mg total) by mouth 2 (two) times daily. For prevention of drug induced tremors 30 tablet 0 unknown  .  citalopram (CELEXA) 40 MG tablet Take 1 tablet (40 mg total) by mouth daily. For depression 30 tablet 0 unknown  . diphenhydrAMINE (BENADRYL) 25 MG tablet Take 1 tablet (25 mg total) by mouth every 6 (six) hours. 20 tablet 0 unknown  . haloperidol decanoate (HALDOL DECANOATE) 100 MG/ML injection Inject 1 mL (100 mg total) into the muscle every 28 (twenty-eight) days. (Due on 08-05-15): For mood control 1 mL 0 unknown  . LORazepam (ATIVAN) 1 MG tablet Take 1 tablet (1 mg total) by mouth 2 (two) times daily. For anxiety 6 tablet 0 unknown  . traZODone (DESYREL) 100 MG tablet Take 1 tablet (100 mg total) by mouth at bedtime. For sleep 30 tablet 0 unknown  . vitamin B-12 1000 MCG tablet Take 1 tablet (1,000 mcg total) by mouth daily. For low B-12 replacement 30 tablet 0 unknown    Musculoskeletal: Strength & Muscle Tone: within normal limits Gait & Station: normal Patient leans: N/A  Psychiatric Specialty Exam: Physical Exam blood pressure low vital stable   ROS no EPS or TD  Blood pressure 94/66, pulse 90, temperature 98 F (36.7 C), temperature source Oral, resp. rate 18, height 5\' 6"  (1.676 m), weight 64.4 kg.Body mass index is 22.92 kg/m.  General Appearance: Casual and Disheveled  Eye Contact:  Fair  Speech:  Slow  Volume:  Decreased  Mood:  Dysphoric  Affect:  Flat  Thought Process:  Disorganized  Orientation:  Full (Time, Place, and Person) not exact date or month  Thought Content:  Illogical and Tangential  Suicidal Thoughts:  No  Homicidal Thoughts:  No  Memory:  Immediate;   Poor   Judgement:  Poor  Insight:  fair  Psychomotor Activity:  Decreased  Concentration:  Concentration: Fair  Recall:  Poor  Fund of Knowledge:  Fair  Language:  Good  Akathisia:  Negative  Handed:  Right  AIMS (if indicated):     Assets:  Physical Health Social Support  ADL's:  Intact  Cognition:  WNL  Sleep:  Number of Hours: 7.75    Treatment Plan Summary: Daily contact with patient to assess and evaluate symptoms and progress in treatment, Medication management and Plan Continue current measures but augment with oral Haldol rather than aripiprazole  Observation Level/Precautions:  15 minute checks  Laboratory:  UDS  Psychotherapy: Reality based  Medications: Avoid partial agonist with full agonist  Consultations: Not necessary  Discharge Concerns: Long-term compliance and safety  Estimated LOS: 5-7  Other:   Axis I exacerbation underlying schizophrenic disorder, recent opiate abuse negative drug screen   Physician Treatment Plan for Primary Diagnosis: <principal problem not specified> Long Term Goal(s): Improvement in symptoms so as ready for discharge  Short Term Goals: Ability to identify and develop effective coping behaviors will improve, Ability to maintain clinical measurements within normal limits will improve and Compliance with prescribed medications will improve  Physician Treatment Plan for Secondary Diagnosis: Active Problems:   Schizophrenia, paranoid type (HCC)  Long Term Goal(s): Improvement in symptoms so as ready for discharge  Short Term Goals: Ability to maintain clinical measurements within normal limits will improve, Compliance with prescribed medications will improve and Ability to identify triggers associated with substance abuse/mental health issues will improve  I certify that inpatient services furnished can reasonably be expected to improve the patient's condition.    Malvin Johns, MD 2/20/20208:52 AM

## 2018-03-20 NOTE — Progress Notes (Signed)
Recreation Therapy Notes  INPATIENT RECREATION THERAPY ASSESSMENT  Patient Details Name: Justin [name]. MRN: [id number] DOB: 1990/03/14 Today's Date: 03/20/2018       Information Obtained From: Patient  Able to Participate in Assessment/Interview: Yes  Patient Presentation: Alert  Reason for Admission (Per Patient): Other (Comments)(Pt stated his mother brought him.)  Patient Stressors: (Pt identified no stressors.)  Coping Skills:   Sports, TV, Music, Exercise, Prayer, Avoidance, Hot Bath/Shower  Leisure Interests (2+):  Individual - TV, Games - Cards, Individual - Phone  Frequency of Recreation/Participation: Weekly  Awareness of Community Resources:  No  Expressed Interest in [employer] Information: No  County of Residence:  [place]  Patient Main Form of Transportation: Set designer  Patient Strengths:  Thinking; Doing  Patient Identified Areas of Improvement:  "My body"  Patient Goal for Hospitalization:  "get healthy"  Current SI (including self-harm):  No  Current HI:  No  Current AVH: No  Staff Intervention Plan: Group Attendance, Collaborate with Interdisciplinary Treatment Team  Consent to Intern Participation: N/A    Caroll Rancher, LRT/CTRS  Caroll Rancher A 03/20/2018, 1:14 PM

## 2018-03-21 DIAGNOSIS — F200 Paranoid schizophrenia: Principal | ICD-10-CM

## 2018-03-21 NOTE — Plan of Care (Signed)
  Problem: Activity: Goal: Sleeping patterns will improve Outcome: Progressing   Problem: Activity: Goal: Interest or engagement in activities will improve Outcome: Not Progressing   

## 2018-03-21 NOTE — [facility] Suicide Risk Assessment (Signed)
[facility] INPATIENT:  Family/Significant Other Suicide Prevention Education  Suicide Prevention Education:  Education Completed; Philis Nettle, mother, [phone], has been identified by the patient as the family member/significant other with whom the patient will be residing, and identified as the person(s) who will aid the patient in the event of a mental health crisis (suicidal ideations/suicide attempt).  With written consent from the patient, the family member/significant other has been provided the following suicide prevention education, prior to the and/or following the discharge of the patient.  The suicide prevention education provided includes the following:  Suicide risk factors  Suicide prevention and interventions  National Suicide Hotline telephone number  [facility] assessment telephone number  [facility] Emergency Assistance 911  [facility] and/or Residential Mobile Crisis Unit telephone number  Request made of family/significant other to:  Remove weapons (e.g., guns, rifles, knives), all items previously/currently identified as safety concern.  No guns in the home, per mother.   Remove drugs/medications (over-the-counter, prescriptions, illicit drugs), all items previously/currently identified as a safety concern.  The family member/significant other verbalizes understanding of the suicide prevention education information provided.  The family member/significant other agrees to remove the items of safety concern listed above.  Mother is familiar with IVC process.  She asked about medicaid application, outpt IVC, and referral to ACT team.  CSW updated her.  Lorri Frederick, LCSW 03/21/2018, 2:50 PM

## 2018-03-21 NOTE — Progress Notes (Signed)
[facility] MD Progress Note  03/21/2018 7:45 AM Justin [name].  MRN:  [id number] Subjective:    Patient is seen on rounds and he is very guarded today he denies having specific paranoid delusions but will minimally answer questions and states "I do not know" to basic questions of orientation of day date and time, basic memory testing, and even whether or not he has experienced hallucinations in the last 24 hours. Negative symptoms combined with some degree of paranoia compromised the interview but he is clearly not baseline. No EPS or TD  Spoke with his mother yesterday she is interested in perhaps escalating the dose of the long-acting injectable because the 100 mg of Haldol does not hold him But of course compliance is the main issue and she is interested in an act team, further he has indeed had Medicaid in the past but it has lapsed  For now, as far as Haldol, continue the oral supplementation.  Principal Problem:  Diagnosis: Active Problems:   Schizophrenia, paranoid type (HCC)  Total Time spent with patient: 20 minutes  Past Medical History:  Past Medical History:  Diagnosis Date  . Abnormal behavior   . Homelessness   . No significant past medical history   . Schizophrenia (HCC)    History reviewed. No pertinent surgical history. Family History:  Family History  Problem Relation Age of Onset  . Depression Father   . Bipolar disorder Father   . Alcohol abuse Father     Social History:  Social History   Substance and Sexual Activity  Alcohol Use No     Social History   Substance and Sexual Activity  Drug Use No    Social History   Socioeconomic History  . Marital status: Single    Spouse name: Not on file  . Number of children: Not on file  . Years of education: Not on file  . Highest education level: Not on file  Occupational History  . Not on file  Social Needs  . Financial resource strain: Not on file  . Food insecurity:    Worry: Not on file     Inability: Not on file  . Transportation needs:    Medical: Not on file    Non-medical: Not on file  Tobacco Use  . Smoking status: Never Smoker  . Smokeless tobacco: Never Used  Substance and Sexual Activity  . Alcohol use: No  . Drug use: No  . Sexual activity: Yes  Lifestyle  . Physical activity:    Days per week: Not on file    Minutes per session: Not on file  . Stress: Not on file  Relationships  . Social connections:    Talks on phone: Not on file    Gets together: Not on file    Attends religious service: Not on file    Active member of club or organization: Not on file    Attends meetings of clubs or organizations: Not on file    Relationship status: Not on file  Other Topics Concern  . Not on file  Social History Narrative   ** Merged History Encounter **       Additional Social History:                         Sleep: Good  Appetite:  Good  Current Medications: Current Facility-Administered Medications  Medication Dose Route Frequency Provider Last Rate Last Dose  . acetaminophen (TYLENOL) tablet  650 mg  650 mg Oral Q6H PRN Charm Rings, NP   650 mg at 03/21/18 5859  . alum & mag hydroxide-simeth (MAALOX/MYLANTA) 200-200-20 MG/5ML suspension 30 mL  30 mL Oral Q4H PRN Charm Rings, NP      . benztropine (COGENTIN) tablet 1 mg  1 mg Oral BID Charm Rings, NP   1 mg at 03/20/18 1718  . citalopram (CELEXA) tablet 10 mg  10 mg Oral Daily Charm Rings, NP   10 mg at 03/20/18 0816  . dicyclomine (BENTYL) tablet 20 mg  20 mg Oral Q6H PRN Charm Rings, NP      . diphenhydrAMINE (BENADRYL) capsule 25 mg  25 mg Oral Q6H Charm Rings, NP   25 mg at 03/21/18 2924  . haloperidol (HALDOL) tablet 10 mg  10 mg Oral BID Charm Rings, NP   10 mg at 03/20/18 1717  . haloperidol (HALDOL) tablet 10 mg  10 mg Oral QHS Malvin Johns, MD   10 mg at 03/20/18 2306  . haloperidol decanoate (HALDOL DECANOATE) 100 MG/ML injection 100 mg  100 mg  Intramuscular Q28 days Charm Rings, NP   100 mg at 03/19/18 2117  . hydrOXYzine (ATARAX/VISTARIL) tablet 25 mg  25 mg Oral Q6H PRN Charm Rings, NP      . loperamide (IMODIUM) capsule 2-4 mg  2-4 mg Oral PRN Charm Rings, NP      . LORazepam (ATIVAN) tablet 1 mg  1 mg Oral BID Charm Rings, NP   1 mg at 03/20/18 1716  . magnesium hydroxide (MILK OF MAGNESIA) suspension 30 mL  30 mL Oral Daily PRN Charm Rings, NP      . methocarbamol (ROBAXIN) tablet 500 mg  500 mg Oral Q8H PRN Charm Rings, NP   500 mg at 03/21/18 4628  . naproxen (NAPROSYN) tablet 500 mg  500 mg Oral BID PRN Charm Rings, NP      . ondansetron (ZOFRAN-ODT) disintegrating tablet 4 mg  4 mg Oral Q6H PRN Charm Rings, NP      . traZODone (DESYREL) tablet 100 mg  100 mg Oral QHS Charm Rings, NP   100 mg at 03/19/18 2110  . vitamin B-12 (CYANOCOBALAMIN) tablet 1,000 mcg  1,000 mcg Oral Daily Charm Rings, NP   1,000 mcg at 03/20/18 6381    Lab Results: No results found for this or any previous visit (from the past 48 hour(s)).  Blood Alcohol level:  Lab Results  Component Value Date   ETH <10 03/18/2018   ETH <5 07/08/2015    Metabolic Disorder Labs: Lab Results  Component Value Date   HGBA1C 4.7 (L) 07/19/2015   MPG 88 07/19/2015   No results found for: PROLACTIN Lab Results  Component Value Date   CHOL 167 07/19/2015   TRIG 60 07/19/2015   HDL 42 07/19/2015   CHOLHDL 4.0 07/19/2015   VLDL 12 07/19/2015   LDLCALC 113 (H) 07/19/2015    Physical Findings: AIMS: Facial and Oral Movements Muscles of Facial Expression: None, normal Lips and Perioral Area: None, normal Jaw: None, normal Tongue: None, normal,Extremity Movements Upper (arms, wrists, hands, fingers): None, normal Lower (legs, knees, ankles, toes): None, normal, Trunk Movements Neck, shoulders, hips: None, normal, Overall Severity Severity of abnormal movements (highest score from questions above): None,  normal Incapacitation due to abnormal movements: None, normal Patient's awareness of abnormal movements (rate only patient's report): No Awareness,  Dental Status Current problems with teeth and/or dentures?: No Does patient usually wear dentures?: No  CIWA:    COWS:     Musculoskeletal: Strength & Muscle Tone: within normal limits Gait & Station: normal Patient leans: N/A  Psychiatric Specialty Exam: Physical Exam  ROS  Blood pressure 112/71, pulse (!) 107, temperature 97.8 F (36.6 C), temperature source Oral, resp. rate 18, height 5\' 6"  (1.676 m), weight 64.4 kg.Body mass index is 22.92 kg/m.  General Appearance: Disheveled  Eye Contact:  Minimal  Speech:  Slow  Volume:  Decreased  Mood:  Irritable  Affect:  Depressed  Thought Process:  Disorganized  Orientation: Presumed to be to person and general place but will not answer specifically  Thought Content: Poverty of content combined with mild paranoia  Suicidal Thoughts:  No  Homicidal Thoughts:  No  Memory:  Immediate;   Poor  Judgement:  Impaired  Insight:  Shallow  Psychomotor Activity:  Decreased  Concentration:  Concentration: Poor  Recall:  Poor  Fund of Knowledge:  Poor  Language:  Poor  Akathisia:  Negative  Handed:  Right  AIMS (if indicated):     Assets:  Physical Health Resilience Social Support  ADL's:  Intact  Cognition:  WNL  Sleep:  Number of Hours: 6.75     Treatment Plan Summary: Daily contact with patient to assess and evaluate symptoms and progress in treatment, Medication management and Plan Continue current oral supplementation of long-acting injectable continue cognitive and reality based therapies and 15-minute precautions again screen for act team while he is here we will fax information to them, and continue current measures for therapy  Reilley Latorre, MD 03/21/2018, 7:45 AM

## 2018-03-21 NOTE — [facility] Group Notes (Signed)
Date: 03/21/18, 1300  Type of Therapy and Topic: Chaplain group, "Hope is.." Chaplain engaged group in discussion about hope and what it looks like in each patients life and current situation.  Participation level:Did not attend  Modes of Intervention: Discussion, Education and Socialization  Summary of Progress/Problems:   Rajanae Mantia Jon, LCSW   [facility] LCSW Group Therapy Note 

## 2018-03-21 NOTE — Progress Notes (Signed)
Dar Note: Patient presents with blunted affect and irritable mood.  Denies suicidal thoughts, auditory and visual hallucinations.  Refused morning medication when offered.  MD made aware.  Routine safety checks maintained every 15 minutes.  Patient visible in milieu watching TV while interacting with peers.  Patient is safe on and off the unit.

## 2018-03-21 NOTE — [facility] Group Notes (Signed)
Pt was invited but did not attend orientation group. 

## 2018-03-21 NOTE — Progress Notes (Signed)
CSW contacted Judeth Cornfield at [facility].  They do not have any IPRS slots for ACT team clients.  We discussed criteria and due to pt last inpt hospitalization being in 2017, pt may not meet criteria either.    CSW left message for Angelique Blonder at [facility] for potential TCT team services. Garner Nash, MSW, LCSW Clinical Social Worker 03/21/2018 3:14 PM

## 2018-03-21 NOTE — Progress Notes (Signed)
D:  Justin [name] has been in his bed all shift.  He did not attend evening wrap up group.  He denied SI/HI or A/V hallucinations.  He was minimal in his conversation and only answered few questions before falling asleep again.  He was asleep and snoring at hs med pass but he did take his hs haldol.  He declined sleeping medication.   A:  1:1 with RN for support and encouragement.  Medications as ordered.  Q 15 minute checks maintained for safety.  Encouraged participation in group and unit activities.   R:  Justin [name] remains safe on the unit.  We will continue to monitor the progress towards his goals.

## 2018-03-21 NOTE — Tx Team (Signed)
Interdisciplinary Treatment and Diagnostic Plan Update  03/21/2018 Time of Session: 0910 Justin [name]. MRN: [id number]  Principal Diagnosis: <principal problem not specified>  Secondary Diagnoses: Active Problems:   Schizophrenia, paranoid type (HCC)   Current Medications:  Current Facility-Administered Medications  Medication Dose Route Frequency Provider Last Rate Last Dose  . acetaminophen (TYLENOL) tablet 650 mg  650 mg Oral Q6H PRN Charm Rings, NP   650 mg at 03/21/18 1594  . alum & mag hydroxide-simeth (MAALOX/MYLANTA) 200-200-20 MG/5ML suspension 30 mL  30 mL Oral Q4H PRN Charm Rings, NP      . benztropine (COGENTIN) tablet 1 mg  1 mg Oral BID Charm Rings, NP   1 mg at 03/20/18 1718  . citalopram (CELEXA) tablet 10 mg  10 mg Oral Daily Charm Rings, NP   10 mg at 03/20/18 0816  . dicyclomine (BENTYL) tablet 20 mg  20 mg Oral Q6H PRN Charm Rings, NP      . diphenhydrAMINE (BENADRYL) capsule 25 mg  25 mg Oral Q6H Charm Rings, NP   25 mg at 03/21/18 5859  . haloperidol (HALDOL) tablet 10 mg  10 mg Oral BID Charm Rings, NP   10 mg at 03/20/18 1717  . haloperidol (HALDOL) tablet 10 mg  10 mg Oral QHS Malvin Johns, MD   10 mg at 03/20/18 2306  . haloperidol decanoate (HALDOL DECANOATE) 100 MG/ML injection 100 mg  100 mg Intramuscular Q28 days Charm Rings, NP   100 mg at 03/19/18 2117  . hydrOXYzine (ATARAX/VISTARIL) tablet 25 mg  25 mg Oral Q6H PRN Charm Rings, NP      . loperamide (IMODIUM) capsule 2-4 mg  2-4 mg Oral PRN Charm Rings, NP      . LORazepam (ATIVAN) tablet 1 mg  1 mg Oral BID Charm Rings, NP   1 mg at 03/20/18 1716  . magnesium hydroxide (MILK OF MAGNESIA) suspension 30 mL  30 mL Oral Daily PRN Charm Rings, NP      . methocarbamol (ROBAXIN) tablet 500 mg  500 mg Oral Q8H PRN Charm Rings, NP   500 mg at 03/21/18 2924  . naproxen (NAPROSYN) tablet 500 mg  500 mg Oral BID PRN Charm Rings, NP      .  ondansetron (ZOFRAN-ODT) disintegrating tablet 4 mg  4 mg Oral Q6H PRN Charm Rings, NP      . traZODone (DESYREL) tablet 100 mg  100 mg Oral QHS Charm Rings, NP   100 mg at 03/19/18 2110  . vitamin B-12 (CYANOCOBALAMIN) tablet 1,000 mcg  1,000 mcg Oral Daily Charm Rings, NP   1,000 mcg at 03/20/18 4628   PTA Medications: Medications Prior to Admission  Medication Sig Dispense Refill Last Dose  . ARIPiprazole (ABILIFY) 10 MG tablet Take 1 tablet (10 mg total) by mouth at bedtime. For mood control 30 tablet 0 unknown  . benztropine (COGENTIN) 1 MG tablet Take 1 tablet (1 mg total) by mouth 2 (two) times daily. For prevention of drug induced tremors 30 tablet 0 unknown  . citalopram (CELEXA) 40 MG tablet Take 1 tablet (40 mg total) by mouth daily. For depression 30 tablet 0 unknown  . diphenhydrAMINE (BENADRYL) 25 MG tablet Take 1 tablet (25 mg total) by mouth every 6 (six) hours. 20 tablet 0 unknown  . haloperidol decanoate (HALDOL DECANOATE) 100 MG/ML injection Inject 1 mL (100 mg total) into  the muscle every 28 (twenty-eight) days. (Due on 08-05-15): For mood control 1 mL 0 unknown  . LORazepam (ATIVAN) 1 MG tablet Take 1 tablet (1 mg total) by mouth 2 (two) times daily. For anxiety 6 tablet 0 unknown  . traZODone (DESYREL) 100 MG tablet Take 1 tablet (100 mg total) by mouth at bedtime. For sleep 30 tablet 0 unknown  . vitamin B-12 1000 MCG tablet Take 1 tablet (1,000 mcg total) by mouth daily. For low B-12 replacement 30 tablet 0 unknown    Patient Stressors: Marital or family conflict Medication change or noncompliance  Patient Strengths: Ability for insight Average or above average intelligence Capable of independent living General fund of knowledge  Treatment Modalities: Medication Management, Group therapy, Case management,  1 to 1 session with clinician, Psychoeducation, Recreational therapy.   Physician Treatment Plan for Primary Diagnosis: <principal problem not  specified> Long Term Goal(s): Improvement in symptoms so as ready for discharge Improvement in symptoms so as ready for discharge   Short Term Goals: Ability to identify and develop effective coping behaviors will improve Ability to maintain clinical measurements within normal limits will improve Compliance with prescribed medications will improve Ability to maintain clinical measurements within normal limits will improve Compliance with prescribed medications will improve Ability to identify triggers associated with substance abuse/mental health issues will improve  Medication Management: Evaluate patient's response, side effects, and tolerance of medication regimen.  Therapeutic Interventions: 1 to 1 sessions, Unit Group sessions and Medication administration.  Evaluation of Outcomes: Progressing  Physician Treatment Plan for Secondary Diagnosis: Active Problems:   Schizophrenia, paranoid type (HCC)  Long Term Goal(s): Improvement in symptoms so as ready for discharge Improvement in symptoms so as ready for discharge   Short Term Goals: Ability to identify and develop effective coping behaviors will improve Ability to maintain clinical measurements within normal limits will improve Compliance with prescribed medications will improve Ability to maintain clinical measurements within normal limits will improve Compliance with prescribed medications will improve Ability to identify triggers associated with substance abuse/mental health issues will improve     Medication Management: Evaluate patient's response, side effects, and tolerance of medication regimen.  Therapeutic Interventions: 1 to 1 sessions, Unit Group sessions and Medication administration.  Evaluation of Outcomes: Progressing   RN Treatment Plan for Primary Diagnosis: <principal problem not specified> Long Term Goal(s): Knowledge of disease and therapeutic regimen to maintain health will improve  Short Term Goals:  Ability to identify and develop effective coping behaviors will improve and Compliance with prescribed medications will improve  Medication Management: RN will administer medications as ordered by provider, will assess and evaluate patient's response and provide education to patient for prescribed medication. RN will report any adverse and/or side effects to prescribing provider.  Therapeutic Interventions: 1 on 1 counseling sessions, Psychoeducation, Medication administration, Evaluate responses to treatment, Monitor vital signs and CBGs as ordered, Perform/monitor CIWA, COWS, AIMS and Fall Risk screenings as ordered, Perform wound care treatments as ordered.  Evaluation of Outcomes: Progressing   LCSW Treatment Plan for Primary Diagnosis: <principal problem not specified> Long Term Goal(s): Safe transition to appropriate next level of care at discharge, Engage patient in therapeutic group addressing interpersonal concerns.  Short Term Goals: Engage patient in aftercare planning with referrals and resources, Increase social support and Increase skills for wellness and recovery  Therapeutic Interventions: Assess for all discharge needs, 1 to 1 time with Social worker, Explore available resources and support systems, Assess for adequacy in community support network,  Educate family and significant other(s) on suicide prevention, Complete Psychosocial Assessment, Interpersonal group therapy.  Evaluation of Outcomes: Progressing   Progress in Treatment: Attending groups: No. Participating in groups: No. Taking medication as prescribed: Yes. Toleration medication: Yes. Family/Significant other contact made: No, will contact:  mother Patient understands diagnosis: Yes. Discussing patient identified problems/goals with staff: Yes. Medical problems stabilized or resolved: Yes. Denies suicidal/homicidal ideation: Yes. Issues/concerns per patient self-inventory: No. Other: none  New problem(s)  identified: No, Describe:  none  New Short Term/Long Term Goal(s):  Patient Goals:  "get healthy"  Discharge Plan or Barriers:   Reason for Continuation of Hospitalization: Delusions  Medication stabilization  Estimated Length of Stay: 2-4 days.  Attendees: Patient:Justin [name] 03/21/2018   Physician: Dr. Jeannine Kitten, MD 03/21/2018   Nursing: Roddie Mc, RN 03/21/2018   RN Care Manager: 03/21/2018   Social Worker: Daleen Squibb, LCSW 03/21/2018   Recreational Therapist:  03/21/2018   Other:  03/21/2018   Other:  03/21/2018   Other: 03/21/2018        Scribe for Treatment Team: Lorri Frederick, LCSW 03/21/2018 11:36 AM

## 2018-03-22 MED ORDER — HALOPERIDOL 5 MG PO TABS
10.0000 mg | ORAL_TABLET | Freq: Two times a day (BID) | ORAL | Status: DC
  Administered 2018-03-22 – 2018-03-25 (×7): 10 mg via ORAL
  Filled 2018-03-22 (×10): qty 2

## 2018-03-22 MED ORDER — CITALOPRAM HYDROBROMIDE 20 MG PO TABS
20.0000 mg | ORAL_TABLET | Freq: Every day | ORAL | Status: DC
  Administered 2018-03-23 – 2018-03-25 (×3): 20 mg via ORAL
  Filled 2018-03-22 (×4): qty 1

## 2018-03-22 NOTE — [facility] Group Notes (Addendum)
[facility] Group Notes: (Clinical Social Work)   03/23/2018      Type of Therapy:  Group Therapy   Participation Level:  Did Not Attend despite MHT prompting   Ambrose Mantle, LCSW 03/23/2018, 12:25 PM

## 2018-03-22 NOTE — Progress Notes (Signed)
D:  [name] has been in the day room much of the evening.  He attended evening wrap up group.  He denied SI/HI or A/V hallucinations.  He continues to complain of foot pain from recent spider bite and took Naproxen with good relief.  He took hs medications without difficulty.  He is currently resting with his eyes closed and appears to be asleep. A:  1:1 with RN for support and encouragement.  Medications as ordered.  Q 15 minute checks maintained for safety.  Encouraged participation in group and unit activities.   R:  Justin [name] remains safe on the unit.  We will continue to monitor the progress towards his goals.

## 2018-03-22 NOTE — Progress Notes (Signed)
D: Pt denies SI/HI/AVH. Pt is pleasant and cooperative. Pt appeared paranoid and suspicious this evening. Pt  A little irritable , so pt given night medications and snack early.   A: Pt was offered support and encouragement. Pt was given scheduled medications early as previously mentioned. Pt was encourage to attend groups. Q 15 minute checks were done for safety.   R:  safety maintained on unit.

## 2018-03-22 NOTE — Progress Notes (Addendum)
[facility] MD Progress Note  03/22/2018 2:00 PM Justin [name].  MRN:  [id number]   Subjective: Justin [name] observed sitting by provider door. "  I need to talk to you about discharge."  He is awake, alert and oriented x3.  Justin [name] presents flat, guarded and irritable.  Has concerns with right-sided foot pain related to "spider bite. 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   patient is requesting to discharge today.  Denies auditory visual hallucinations.  Denies suicidal or homicidal ideations.  Denies depression or depressive symptoms.  Discussed patient to follow-up with attending psychiatrist on Monday.  Staff reports mother had concerns with discharging home.  CSW to follow-up.  Reports a good appetite.  States he is resting well throughout the night.  Chart review patient continues to need encouragement with medication management.  Support encouragement and reassurance was provided.   History: Per assessment note: Justin [name]. is an 28 y.o. male. Pt presents as walk in at [facility] with his mother.  Pt talking to himself, laughing inappropriately, unable to provide any information for TTS assessment.  Pt talked to himself throughout the time TTS in the assessment room.  Pt did refer to "them talking" at one point and when asked who was talking said it wasn't him and it wasn't [place] (mother or TTS)  All information from mother, Justin [name], who pt resides with.  Pt has schizophrenia and bipolar diagnosis was going to [facility] for medication including injections.  Stopped going last year, mother believes he has been off medication for about 6 months.  Pt mental status has deteriorated in the past week: mother reports he is not sleeping and she can here him up and moving around at night.  Mother received call from friends who saw pt laying on park bench in the rain.  Mother could not find pt for several days and finally found him today. Pt will not  answer questions about SI or HI.  Mother reports pt has been saying things like "kill the bitch" and other violent or aggressive references.   Principal Problem:  Diagnosis: Active Problems:   Schizophrenia, paranoid type (HCC)  Total Time spent with patient: 20 minutes  Past Medical History:  Past Medical History:  Diagnosis Date  . Abnormal behavior   . Homelessness   . No significant past medical history   . Schizophrenia (HCC)    History reviewed. No pertinent surgical history. Family History:  Family History  Problem Relation Age of Onset  . Depression Father   . Bipolar disorder Father   . Alcohol abuse Father     Social History:  Social History   Substance and Sexual Activity  Alcohol Use No     Social History   Substance and Sexual Activity  Drug Use No    Social History   Socioeconomic History  . Marital status: Single    Spouse name: Not on file  . Number of children: Not on file  . Years of education: Not on file  . Highest education level: Not on file  Occupational History  . Not on file  Social Needs  . Financial resource strain: Not on file  . Food insecurity:    Worry: Not on file    Inability: Not on file  . Transportation needs:    Medical: Not on file    Non-medical: Not on file  Tobacco Use  . Smoking status: Never Smoker  .  Smokeless tobacco: Never Used  Substance and Sexual Activity  . Alcohol use: No  . Drug use: No  . Sexual activity: Yes  Lifestyle  . Physical activity:    Days per week: Not on file    Minutes per session: Not on file  . Stress: Not on file  Relationships  . Social connections:    Talks on phone: Not on file    Gets together: Not on file    Attends religious service: Not on file    Active member of club or organization: Not on file    Attends meetings of clubs or organizations: Not on file    Relationship status: Not on file  Other Topics Concern  . Not on file  Social History Narrative   ** Merged  History Encounter **       Additional Social History:                         Sleep: Good  Appetite:  Good  Current Medications: Current Facility-Administered Medications  Medication Dose Route Frequency Provider Last Rate Last Dose  . acetaminophen (TYLENOL) tablet 650 mg  650 mg Oral Q6H PRN Charm Rings, NP   650 mg at 03/22/18 1303  . alum & mag hydroxide-simeth (MAALOX/MYLANTA) 200-200-20 MG/5ML suspension 30 mL  30 mL Oral Q4H PRN Charm Rings, NP      . benztropine (COGENTIN) tablet 1 mg  1 mg Oral BID Charm Rings, NP   1 mg at 03/22/18 1478  . citalopram (CELEXA) tablet 10 mg  10 mg Oral Daily Charm Rings, NP   10 mg at 03/22/18 0801  . dicyclomine (BENTYL) tablet 20 mg  20 mg Oral Q6H PRN Charm Rings, NP      . diphenhydrAMINE (BENADRYL) capsule 25 mg  25 mg Oral Q6H Charm Rings, NP   25 mg at 03/22/18 1213  . haloperidol (HALDOL) tablet 10 mg  10 mg Oral BID Oneta Rack, NP   10 mg at 03/22/18 1011  . haloperidol decanoate (HALDOL DECANOATE) 100 MG/ML injection 100 mg  100 mg Intramuscular Q28 days Charm Rings, NP   100 mg at 03/19/18 2117  . hydrOXYzine (ATARAX/VISTARIL) tablet 25 mg  25 mg Oral Q6H PRN Charm Rings, NP      . loperamide (IMODIUM) capsule 2-4 mg  2-4 mg Oral PRN Charm Rings, NP      . LORazepam (ATIVAN) tablet 1 mg  1 mg Oral BID Charm Rings, NP   1 mg at 03/22/18 2956  . magnesium hydroxide (MILK OF MAGNESIA) suspension 30 mL  30 mL Oral Daily PRN Charm Rings, NP      . methocarbamol (ROBAXIN) tablet 500 mg  500 mg Oral Q8H PRN Charm Rings, NP   500 mg at 03/22/18 2130  . naproxen (NAPROSYN) tablet 500 mg  500 mg Oral BID PRN Charm Rings, NP   500 mg at 03/22/18 0801  . ondansetron (ZOFRAN-ODT) disintegrating tablet 4 mg  4 mg Oral Q6H PRN Charm Rings, NP      . traZODone (DESYREL) tablet 100 mg  100 mg Oral QHS Charm Rings, NP   100 mg at 03/21/18 2123  . vitamin B-12 (CYANOCOBALAMIN)  tablet 1,000 mcg  1,000 mcg Oral Daily Charm Rings, NP   1,000 mcg at 03/22/18 8657    Lab Results: No results found for  this or any previous visit (from the past 48 hour(s)).  Blood Alcohol level:  Lab Results  Component Value Date   ETH <10 03/18/2018   ETH <5 07/08/2015    Metabolic Disorder Labs: Lab Results  Component Value Date   HGBA1C 4.7 (L) 07/19/2015   MPG 88 07/19/2015   No results found for: PROLACTIN Lab Results  Component Value Date   CHOL 167 07/19/2015   TRIG 60 07/19/2015   HDL 42 07/19/2015   CHOLHDL 4.0 07/19/2015   VLDL 12 07/19/2015   LDLCALC 113 (H) 07/19/2015    Physical Findings: AIMS: Facial and Oral Movements Muscles of Facial Expression: None, normal Lips and Perioral Area: None, normal Jaw: None, normal Tongue: None, normal,Extremity Movements Upper (arms, wrists, hands, fingers): None, normal Lower (legs, knees, ankles, toes): None, normal, Trunk Movements Neck, shoulders, hips: None, normal, Overall Severity Severity of abnormal movements (highest score from questions above): None, normal Incapacitation due to abnormal movements: None, normal Patient's awareness of abnormal movements (rate only patient's report): No Awareness, Dental Status Current problems with teeth and/or dentures?: No Does patient usually wear dentures?: No  CIWA:    COWS:     Musculoskeletal: Strength & Muscle Tone: within normal limits Gait & Station: normal Patient leans: N/A  Psychiatric Specialty Exam: Physical Exam  Nursing note and vitals reviewed. Constitutional: He appears well-developed.  Psychiatric: He has a normal mood and affect.    Review of Systems  Musculoskeletal:       Foot pain  Psychiatric/Behavioral: The patient is nervous/anxious.   All other systems reviewed and are negative.   Blood pressure (!) 144/118, pulse (!) 101, temperature 98 F (36.7 C), temperature source Oral, resp. rate 18, height  (1.676 m), weight 64.4  kg.Body mass index is 22.92 kg/m.  General Appearance: Disheveled  Eye Contact:  Minimal  Speech:  Slow  Volume:  Decreased  Mood:  Irritable  Affect:  Depressed  Thought Process:  Disorganized-improving  Orientation: Patient is oriented to person place and time  Thought Content: Poverty of content combined with mild paranoia  Suicidal Thoughts:  No  Homicidal Thoughts:  No  Memory:  Immediate;   Poor  Judgement:  Impaired  Insight:  Shallow  Psychomotor Activity:  Decreased  Concentration:  Concentration: Poor  Recall:  Poor  Fund of Knowledge:  Poor  Language:  Poor  Akathisia:  Negative  Handed:  Right  AIMS (if indicated):     Assets:  Physical Health Resilience Social Support  ADL's:  Intact  Cognition:  WNL  Sleep:  Number of Hours: 6.75     Treatment Plan Summary: Daily contact with patient to assess and evaluate symptoms and progress in treatment and Medication management   Continue current treatment plan on 03/22/2018 as listed below except were noted  Schizophrenia:  Increased Celexa 10 mg to 20 mg p.o. daily Continue Haldol 10 mg p.o. twice daily Continue Cogentin 1 mg p.o. twice daily  Insomnia  Continue trazodone 100 mg p.o. nightly  CSW to continue working on discharge disposition.  And follow-up with act team services Patient encouraged to participate within the milieu.  Oneta Rack, NP 03/22/2018, 2:00 PM    ..Agree with NP Progress Note

## 2018-03-22 NOTE — Plan of Care (Signed)
   Problem: Education: Goal: Emotional status will improve Outcome: Progressing Goal: Mental status will improve Outcome: Progressing   Problem: Coping: Goal: Ability to verbalize frustrations and anger appropriately will improve Outcome: Progressing    D: Pt alert and oriented on the unit. Pt denies SI/HI, A/VH. Pt participated during unit groups and activities. Pt is pleasant and cooperative.  A: Education, support and encouragement provided, q15 minute safety checks remain in effect. Medications administered per MD orders. R: No reactions/side effects to medicine noted. Pt denies any concerns at this time, and verbally contracts for safety. Pt ambulating on the unit with no issues. Pt remains safe on and off the unit.

## 2018-03-22 NOTE — [facility] Group Notes (Addendum)
[facility] Group Notes:  (Nursing/MHT/Case Management/Adjunct)  Date:  03/22/2018  Time:  6:20 PM  Type of Therapy:  Nurse Education  Participation Level:  Did Not Attend  Summary of Progress/Problems: In this group, we discussed anger management. First, we identified myths about anger. Then we explored the different coping skills to deal with anger. Finally, we talked about rating our anger and how to have a healthy, assertive conversation versus a hostile one. The patient did not attend the group.  Kirstie Mirza 03/22/2018, 6:20 PM

## 2018-03-23 NOTE — Progress Notes (Signed)
D: Pt denies SI/HI/AVH. Pt is pleasant and cooperative. Pt stated he had a good day, pt only focused on pain medication. Pt visible on the unit, pt will interact sparingly with certain peers.   A: Pt was offered support and encouragement. Pt was given scheduled medications. Pt was encourage to attend groups. Q 15 minute checks were done for safety.   R:Pt attends groups and interacts well with peers and staff. Pt is taking medication. Pt has no complaints.Pt receptive to treatment and safety maintained on unit.   Problem: Activity: Goal: Sleeping patterns will improve Outcome: Progressing   Problem: Coping: Goal: Ability to demonstrate self-control will improve Outcome: Progressing

## 2018-03-23 NOTE — Plan of Care (Signed)
  Problem: Activity: Goal: Interest or engagement in activities will improve Outcome: Not Progressing   Problem: Coping: Goal: Ability to verbalize frustrations and anger appropriately will improve Outcome: Progressing   D: Pt alert and oriented on the unit. Pt denies SI/HI, A/VH. Pt's affect was flat. Pt was isolative to his room most of the day and did not attend groups or engage with other pts on the unit. Pt is cooperative. A: Education, support and encouragement provided, q15 minute safety checks remain in effect. Medications administered per MD orders. R: No reactions/side effects to medicine noted. Pt denies any concerns at this time, and verbally contracts for safety. Pt ambulating on the unit with no issues. Pt remains safe on and off the unit.

## 2018-03-23 NOTE — [facility] Group Notes (Signed)
[facility] Group Notes:  (Nursing/MHT/Case Management/Adjunct)  Date:  03/23/2018  Time:  6:10 PM  Type of Therapy:  Nurse Education  Participation Level:  Did Not Attend  Summary of Progress/Problems: In this group, we discussed healthy communication and how to express our needs to others. We covered how to make appropriate "I feel" statements. We also discussed the 5 love languages and took the love language quiz in the program handbook.   Kirstie Mirza 03/23/2018, 6:10 PM

## 2018-03-23 NOTE — Progress Notes (Addendum)
[facility] MD Progress Note  03/23/2018 3:34 PM Justin [name].  MRN:  [id number]     Subjective:  Justin [name] reports. " have you to talk to my mom, I need to see if I can return home?"  I feel like I am ready to discharge."  Evaluation: Justin [name] seen sitting in day room interacting with peers.  He is awake alert and oriented x3.  Patient continues to request to be discharged soon.  Reports he is hopeful that social worker was able to follow-up with his mother regarding discharge.  Patient reports he is agreeable to take long-acting injectable medication for mood.  He denies suicidal or homicidal ideations.  Denies auditory visual hallucinations.  Patient reports he feels his mood is improving every day.  Reports taking medications as prescribed and tolerating them well.  Does not appear to be responding to internal stimuli.  Paranoia is improving.  support encouragement reassurance was provided   History: Per assessment note: [name]. is an 28 y.o. male. Pt presents as walk in at [facility] with his mother.  Pt talking to himself, laughing inappropriately, unable to provide any information for TTS assessment.  Pt talked to himself throughout the time TTS in the assessment room.  Pt did refer to "them talking" at one point and when asked who was talking said it wasn't him and it wasn't [place] (mother or TTS)  All information from mother, Philis Nettle, who pt resides with.  Pt has schizophrenia and bipolar diagnosis was going to [facility] for medication including injections.  Stopped going last year, mother believes he has been off medication for about 6 months.  Pt mental status has deteriorated in the past week: mother reports he is not sleeping and she can here him up and moving around at night.  Mother received call from friends who saw pt laying on park bench in the rain.  Mother could not find pt for several days and finally found him today. Pt will not answer questions about SI or HI.  Mother  reports pt has been saying things like "kill the bitch" and other violent or aggressive references.   Principal Problem:  Diagnosis: Active Problems:   Schizophrenia, paranoid type (HCC)  Total Time spent with patient: 20 minutes  Past Medical History:  Past Medical History:  Diagnosis Date  . Abnormal behavior   . Homelessness   . No significant past medical history   . Schizophrenia (HCC)    History reviewed. No pertinent surgical history. Family History:  Family History  Problem Relation Age of Onset  . Depression Father   . Bipolar disorder Father   . Alcohol abuse Father     Social History:  Social History   Substance and Sexual Activity  Alcohol Use No     Social History   Substance and Sexual Activity  Drug Use No    Social History   Socioeconomic History  . Marital status: Single    Spouse name: Not on file  . Number of children: Not on file  . Years of education: Not on file  . Highest education level: Not on file  Occupational History  . Not on file  Social Needs  . Financial resource strain: Not on file  . Food insecurity:    Worry: Not on file    Inability: Not on file  . Transportation needs:    Medical: Not on file    Non-medical: Not on file  Tobacco Use  .  Smoking status: Never Smoker  . Smokeless tobacco: Never Used  Substance and Sexual Activity  . Alcohol use: No  . Drug use: No  . Sexual activity: Yes  Lifestyle  . Physical activity:    Days per week: Not on file    Minutes per session: Not on file  . Stress: Not on file  Relationships  . Social connections:    Talks on phone: Not on file    Gets together: Not on file    Attends religious service: Not on file    Active member of club or organization: Not on file    Attends meetings of clubs or organizations: Not on file    Relationship status: Not on file  Other Topics Concern  . Not on file  Social History Narrative   ** Merged History Encounter **       Additional  Social History:                         Sleep: Good  Appetite:  Good  Current Medications: Current Facility-Administered Medications  Medication Dose Route Frequency Provider Last Rate Last Dose  . acetaminophen (TYLENOL) tablet 650 mg  650 mg Oral Q6H PRN Charm Rings, NP   650 mg at 03/23/18 1435  . alum & mag hydroxide-simeth (MAALOX/MYLANTA) 200-200-20 MG/5ML suspension 30 mL  30 mL Oral Q4H PRN Charm Rings, NP      . benztropine (COGENTIN) tablet 1 mg  1 mg Oral BID Charm Rings, NP   1 mg at 03/23/18 5784  . citalopram (CELEXA) tablet 20 mg  20 mg Oral Daily Oneta Rack, NP   20 mg at 03/23/18 0804  . dicyclomine (BENTYL) tablet 20 mg  20 mg Oral Q6H PRN Charm Rings, NP      . diphenhydrAMINE (BENADRYL) capsule 25 mg  25 mg Oral Q6H Charm Rings, NP   25 mg at 03/23/18 1206  . haloperidol (HALDOL) tablet 10 mg  10 mg Oral BID Oneta Rack, NP   10 mg at 03/23/18 6962  . haloperidol decanoate (HALDOL DECANOATE) 100 MG/ML injection 100 mg  100 mg Intramuscular Q28 days Charm Rings, NP   100 mg at 03/19/18 2117  . hydrOXYzine (ATARAX/VISTARIL) tablet 25 mg  25 mg Oral Q6H PRN Charm Rings, NP      . loperamide (IMODIUM) capsule 2-4 mg  2-4 mg Oral PRN Charm Rings, NP      . LORazepam (ATIVAN) tablet 1 mg  1 mg Oral BID Charm Rings, NP   1 mg at 03/23/18 0805  . magnesium hydroxide (MILK OF MAGNESIA) suspension 30 mL  30 mL Oral Daily PRN Charm Rings, NP      . methocarbamol (ROBAXIN) tablet 500 mg  500 mg Oral Q8H PRN Charm Rings, NP   500 mg at 03/23/18 0804  . naproxen (NAPROSYN) tablet 500 mg  500 mg Oral BID PRN Charm Rings, NP   500 mg at 03/23/18 1206  . ondansetron (ZOFRAN-ODT) disintegrating tablet 4 mg  4 mg Oral Q6H PRN Charm Rings, NP      . traZODone (DESYREL) tablet 100 mg  100 mg Oral QHS Charm Rings, NP   100 mg at 03/22/18 1949  . vitamin B-12 (CYANOCOBALAMIN) tablet 1,000 mcg  1,000 mcg Oral Daily Charm Rings, NP   1,000 mcg at 03/23/18 [phone]  Lab Results: No results found for this or any previous visit (from the past 48 hour(s)).  Blood Alcohol level:  Lab Results  Component Value Date   ETH <10 03/18/2018   ETH <5 07/08/2015    Metabolic Disorder Labs: Lab Results  Component Value Date   HGBA1C 4.7 (L) 07/19/2015   MPG 88 07/19/2015   No results found for: PROLACTIN Lab Results  Component Value Date   CHOL 167 07/19/2015   TRIG 60 07/19/2015   HDL 42 07/19/2015   CHOLHDL 4.0 07/19/2015   VLDL 12 07/19/2015   LDLCALC 113 (H) 07/19/2015    Physical Findings: AIMS: Facial and Oral Movements Muscles of Facial Expression: None, normal Lips and Perioral Area: None, normal Jaw: None, normal Tongue: None, normal,Extremity Movements Upper (arms, wrists, hands, fingers): None, normal Lower (legs, knees, ankles, toes): None, normal, Trunk Movements Neck, shoulders, hips: None, normal, Overall Severity Severity of abnormal movements (highest score from questions above): None, normal Incapacitation due to abnormal movements: None, normal Patient's awareness of abnormal movements (rate only patient's report): No Awareness, Dental Status Current problems with teeth and/or dentures?: No Does patient usually wear dentures?: No  CIWA:    COWS:     Musculoskeletal: Strength & Muscle Tone: within normal limits Gait & Station: normal Patient leans: N/A  Psychiatric Specialty Exam: Physical Exam  Vitals reviewed. Constitutional: He appears well-developed.  Psychiatric: He has a normal mood and affect.    Review of Systems  Musculoskeletal:       Foot pain  Psychiatric/Behavioral: The patient is nervous/anxious.   All other systems reviewed and are negative.   Blood pressure 105/74, pulse (!) 115, temperature 97.7 F (36.5 C), temperature source Oral, resp. rate 18, height 5\' 6"  (1.676 m), weight 64.4 kg.Body mass index is 22.92 kg/m.  General Appearance: Casual  paper scrubs  Eye Contact:  Minimal  Speech:  Clear and Coherent  Volume:  Decreased  Mood:  Depressed  Affect:  Depressed  Thought Process:  Linear-improving  Orientation: Person place and time AAO*3  Thought Content: Poverty of content combined with mild paranoia  Suicidal Thoughts:  No  Homicidal Thoughts:  No  Memory:  Immediate;   Poor  Judgement:  Impaired  Insight:  Shallow  Psychomotor Activity:  Normal  Concentration:  Concentration: Poor  Recall:  Poor  Fund of Knowledge:  Poor  Language:  Poor  Akathisia:  Negative  Handed:  Right  AIMS (if indicated):     Assets:  Physical Health Resilience Social Support  ADL's:  Intact  Cognition:  WNL  Sleep:  Number of Hours: 6.75     Treatment Plan Summary: Daily contact with patient to assess and evaluate symptoms and progress in treatment and Medication management   Continue current treatment plan on 03/23/2018 as listed below except were noted  Schizophrenia:  Continue Celexa  20 mg p.o. daily Continue Haldol 10 mg p.o. twice daily Continue Cogentin 1 mg p.o. twice daily  Insomnia  Continue trazodone 100 mg p.o. nightly  CSW to continue working on discharge disposition.  And follow-up with act team services Patient encouraged to participate within the milieu.  Oneta Rack, NP 03/23/2018, 3:34 PM   ..Agree with NP Progress Note

## 2018-03-23 NOTE — [facility] Group Notes (Signed)
[facility] LCSW Group Therapy Note  Date/Time:  03/23/2018  11:00AM-12:00PM  Type of Therapy and Topic:  Group Therapy:  Music and Mood  Participation Level:  Did Not Attend   Description of Group: In this process group, members listened to a variety of genres of music and identified that different types of music evoke different responses.  Patients were encouraged to identify music that was soothing for them and music that was energizing for them.  Patients discussed how this knowledge can help with wellness and recovery in various ways including managing depression and anxiety as well as encouraging healthy sleep habits.    Therapeutic Goals: 1. Patients will explore the impact of different varieties of music on mood 2. Patients will verbalize the thoughts they have when listening to different types of music 3. Patients will identify music that is soothing to them as well as music that is energizing to them 4. Patients will discuss how to use this knowledge to assist in maintaining wellness and recovery 5. Patients will explore the use of music as a coping skill  Summary of Patient Progress:  N/A  Therapeutic Modalities: Solution Focused Brief Therapy Activity   Lamara Brecht Grossman-Orr, LCSW    

## 2018-03-24 MED ORDER — HALOPERIDOL DECANOATE 100 MG/ML IM SOLN
50.0000 mg | Freq: Once | INTRAMUSCULAR | Status: AC
  Administered 2018-03-24: 50 mg via INTRAMUSCULAR
  Filled 2018-03-24: qty 0.5

## 2018-03-24 NOTE — [facility] Group Notes (Signed)
[facility] LCSW Group Therapy Note  Date/Time: 03/24/18, 1315  Type of Therapy and Topic:  Group Therapy:  Overcoming Obstacles  Participation Level:  Did not attend  Description of Group:    In this group patients will be encouraged to explore what they see as obstacles to their own wellness and recovery. They will be guided to discuss their thoughts, feelings, and behaviors related to these obstacles. The group will process together ways to cope with barriers, with attention given to specific choices patients can make. Each patient will be challenged to identify changes they are motivated to make in order to overcome their obstacles. This group will be process-oriented, with patients participating in exploration of their own experiences as well as giving and receiving support and challenge from other group members.  Therapeutic Goals: 1. Patient will identify personal and current obstacles as they relate to admission. 2. Patient will identify barriers that currently interfere with their wellness or overcoming obstacles.  3. Patient will identify feelings, thought process and behaviors related to these barriers. 4. Patient will identify two changes they are willing to make to overcome these obstacles:    Summary of Patient Progress      Therapeutic Modalities:   Cognitive Behavioral Therapy Solution Focused Therapy Motivational Interviewing Relapse Prevention Therapy  Greg Lawsyn Heiler, LCSW 

## 2018-03-24 NOTE — Progress Notes (Signed)
Recreation Therapy Notes  Date: 2.24.20 Time: 1000 Location: 500 Hall Dayroom  Group Topic: Coping Skills  Goal Area(s) Addresses:  Patient will identify positive coping skills. Patient will identify benefit of using coping strategies post d/c.  Intervention: Worksheet  Activity: Healthy vs. Unhealthy Coping Strategies.  Patients were to describe a problem they are currently dealing with.  Patients then identified the unhealthy coping strategies they currently use and the consequences of those unhealthy coping strategies.  Patients would then identify healthy coping strategies they use or could use.  Patients would identify the expected outcomes of the healthy coping strategies and barriers that prevent them from using their positive coping strategies.  Education: Pharmacologist, Building control surveyor.   Education Outcome: Acknowledges understanding/In group clarification offered/Needs additional education.   Clinical Observations/Feedback: Pt did not attend group.     Caroll Rancher, LRT/CTRS         Caroll Rancher A 03/24/2018 11:56 AM

## 2018-03-24 NOTE — Plan of Care (Addendum)
D: Pt denies SI/HI/AVh. Pt been in room majority of the evening. A: Pt was offered support and encouragement. Pt was encourage to attend groups. Q 15 minute checks were done for safety.  R: safety maintained on unit.  Problem: Coping: Goal: Ability to demonstrate self-control will improve Outcome: Progressing   Problem: Education: Goal: Knowledge of the prescribed therapeutic regimen will improve Outcome: Progressing   Problem: Coping: Goal: Coping ability will improve Outcome: Progressing

## 2018-03-24 NOTE — Plan of Care (Signed)
Problem: Activity: Goal: Sleeping patterns will improve Outcome: Progressing   Problem: Safety: Goal: Periods of time without injury will increase Outcome: Progressing   Problem: Nutritional: Goal: Ability to achieve adequate nutritional intake will improve Outcome: Progressing DAR Note: Pt A & O X4. Denies SI, HI, AVH and pain when assessed. Presents with blunted affect and depressed mood. Ambulatory on unit with a slow but steady gait. Reports he slept well with good appetite, normal energy and good concentration level. Rates his depression 5/10, hopelessness 6/10 and anxiety 2/10. Pt's goal for today is to "talk to Child psychotherapist and boyfriend". Continues to demand medications even when educated about medication scheduled.  Emotional support and reassurance offered to pt throughout this shift. Encouraged pt to voice concerns, attend to ADLs and comply with current treatment regimen including groups. Q 15 minutes safety checks maintained without self harm gestures or outburst. All medications administered per provider's order with verbal education and effects monitored.  Pt did not attend groups as scheduled despite multiple prompts. Tolerates all PO intake well. Took his medications when offered. Denies side effects.

## 2018-03-24 NOTE — Progress Notes (Signed)
CSW spoke to [facility]--they do have IPRS slot, will come by to speak to pt this afternoon. Garner Nash, MSW, LCSW Clinical Social Worker 03/24/2018 9:58 AM

## 2018-03-24 NOTE — Progress Notes (Signed)
[facility] MD Progress Note  03/24/2018 10:40 AM Justin [name] Justin [name].  MRN:  [id number] Subjective:    Justin [name] is requesting discharge yet we do have one final med adjustment and we have to try and get some follow-up arranged she does not qualify yet for an act team because of the paucity of hospitalizations in the last year but in any rate he is currently alert oriented to person place situation time but is not sure the exact date he thinks it is Friday at any rate he is cooperative with me and denying hallucinations.  He acknowledges that his previous dose of Haldol, - like his mother is confirmed was not holding him as far as his long-acting injectable-and needs a higher dose we will go ahead and give him 50 mg and again and so his total dose will be 150 every month rather than 100  Principal Problem: Schizophrenia Diagnosis: Active Problems:   Schizophrenia, paranoid type (HCC)  Total Time spent with patient: 20 minutes  Past Medical History:  Past Medical History:  Diagnosis Date  . Abnormal behavior   . Homelessness   . No significant past medical history   . Schizophrenia (HCC)    History reviewed. No pertinent surgical history. Family History:  Family History  Problem Relation Age of Onset  . Depression Father   . Bipolar disorder Father   . Alcohol abuse Father     Social History:  Social History   Substance and Sexual Activity  Alcohol Use No     Social History   Substance and Sexual Activity  Drug Use No    Social History   Socioeconomic History  . Marital status: Single    Spouse name: Not on file  . Number of children: Not on file  . Years of education: Not on file  . Highest education level: Not on file  Occupational History  . Not on file  Social Needs  . Financial resource strain: Not on file  . Food insecurity:    Worry: Not on file    Inability: Not on file  . Transportation needs:    Medical: Not on file    Non-medical: Not on file  Tobacco  Use  . Smoking status: Never Smoker  . Smokeless tobacco: Never Used  Substance and Sexual Activity  . Alcohol use: No  . Drug use: No  . Sexual activity: Yes  Lifestyle  . Physical activity:    Days per week: Not on file    Minutes per session: Not on file  . Stress: Not on file  Relationships  . Social connections:    Talks on phone: Not on file    Gets together: Not on file    Attends religious service: Not on file    Active member of club or organization: Not on file    Attends meetings of clubs or organizations: Not on file    Relationship status: Not on file  Other Topics Concern  . Not on file  Social History Narrative   ** Merged History Encounter **       Additional Social History:                         Sleep: Good  Appetite:  Good  Current Medications: Current Facility-Administered Medications  Medication Dose Route Frequency Provider Last Rate Last Dose  . acetaminophen (TYLENOL) tablet 650 mg  650 mg Oral Q6H PRN Charm Rings, NP  650 mg at 03/24/18 0728  . alum & mag hydroxide-simeth (MAALOX/MYLANTA) 200-200-20 MG/5ML suspension 30 mL  30 mL Oral Q4H PRN Charm RingsLord, Jamison Y, NP      . benztropine (COGENTIN) tablet 1 mg  1 mg Oral BID Charm RingsLord, Jamison Y, NP   1 mg at 03/24/18 [id number]  . citalopram (CELEXA) tablet 20 mg  20 mg Oral Daily Oneta RackLewis, Tanika N, NP   20 mg at 03/24/18 0726  . diphenhydrAMINE (BENADRYL) capsule 25 mg  25 mg Oral Q6H Charm RingsLord, Jamison Y, NP   25 mg at 03/24/18 [id number]  . haloperidol (HALDOL) tablet 10 mg  10 mg Oral BID Oneta RackLewis, Tanika N, NP   10 mg at 03/24/18 [id number]  . haloperidol decanoate (HALDOL DECANOATE) 100 MG/ML injection 100 mg  100 mg Intramuscular Q28 days Charm RingsLord, Jamison Y, NP   100 mg at 03/19/18 2117  . haloperidol decanoate (HALDOL DECANOATE) 100 MG/ML injection 50 mg  50 mg Intramuscular Once Malvin JohnsFarah, Navy Rothschild, MD      . LORazepam (ATIVAN) tablet 1 mg  1 mg Oral BID Charm RingsLord, Jamison Y, NP   1 mg at 03/24/18 [id number]  . magnesium hydroxide  (MILK OF MAGNESIA) suspension 30 mL  30 mL Oral Daily PRN Charm RingsLord, Jamison Y, NP      . traZODone (DESYREL) tablet 100 mg  100 mg Oral QHS Charm RingsLord, Jamison Y, NP   100 mg at 03/23/18 2050  . vitamin B-12 (CYANOCOBALAMIN) tablet 1,000 mcg  1,000 mcg Oral Daily Charm RingsLord, Jamison Y, NP   1,000 mcg at 03/24/18 [id number]    Lab Results: No results found for this or any previous visit (from the past 48 hour(s)).  Blood Alcohol level:  Lab Results  Component Value Date   ETH <10 03/18/2018   ETH <5 07/08/2015    Metabolic Disorder Labs: Lab Results  Component Value Date   HGBA1C 4.7 (L) 07/19/2015   MPG 88 07/19/2015   No results found for: PROLACTIN Lab Results  Component Value Date   CHOL 167 07/19/2015   TRIG 60 07/19/2015   HDL 42 07/19/2015   CHOLHDL 4.0 07/19/2015   VLDL 12 07/19/2015   LDLCALC 113 (H) 07/19/2015    Physical Findings: AIMS: Facial and Oral Movements Muscles of Facial Expression: None, normal Lips and Perioral Area: None, normal Jaw: None, normal Tongue: None, normal,Extremity Movements Upper (arms, wrists, hands, fingers): None, normal Lower (legs, knees, ankles, toes): None, normal, Trunk Movements Neck, shoulders, hips: None, normal, Overall Severity Severity of abnormal movements (highest score from questions above): None, normal Incapacitation due to abnormal movements: None, normal Patient's awareness of abnormal movements (rate only patient's report): No Awareness, Dental Status Current problems with teeth and/or dentures?: No Does patient usually wear dentures?: No  CIWA:    COWS:     Musculoskeletal: Strength & Muscle Tone: within normal limits Gait & Station: normal Patient leans: N/A  Psychiatric Specialty Exam: Physical Exam  ROS  Blood pressure 112/60, pulse (!) 110, temperature (!) 97.4 F (36.3 C), temperature source Oral, resp. rate 18, height 5\' 6"  (1.676 m), weight 64.4 kg.Body mass index is 22.92 kg/m.  General Appearance: Casual  Eye  Contact:  Good  Speech:  Clear and Coherent  Volume:  Decreased  Mood:  Dysphoric  Affect:  Blunt  Thought Process:  Coherent  Orientation:  Full (Time, Place, and Person)  Thought Content:  Tangential  Suicidal Thoughts:  No  Homicidal Thoughts:  No  Memory:  Immediate;   Fair  Judgement:  Fair  Insight:  Fair  Psychomotor Activity:  Normal  Concentration:  Concentration: Good  Recall:  Good  Fund of Knowledge:  Negative  Language:  Fair  Akathisia:  Negative  Handed:  Right  AIMS (if indicated):     Assets:  Resilience Social Support Talents/Skills  ADL's:  Intact  Cognition:  WNL  Sleep:  Number of Hours: 6.75     Treatment Plan Summary: Daily contact with patient to assess and evaluate symptoms and progress in treatment, Medication management and Plan Escalate Haldol decanoate in anticipation of discharge tomorrow continue cognitive and reality based therapies  Juergen Hardenbrook, MD 03/24/2018, 10:40 AM

## 2018-03-25 MED ORDER — BENZTROPINE MESYLATE 1 MG PO TABS: 1.0000 mg | ORAL_TABLET | Freq: Two times a day (BID) | ORAL | 1 refills | Status: AC

## 2018-03-25 MED ORDER — HALOPERIDOL DECANOATE 100 MG/ML IM SOLN: 150.0000 mg | INTRAMUSCULAR | 11 refills | Status: DC

## 2018-03-25 MED ORDER — HALOPERIDOL 10 MG PO TABS: ORAL_TABLET | ORAL | 1 refills | Status: DC

## 2018-03-25 MED ORDER — CITALOPRAM HYDROBROMIDE 20 MG PO TABS: 20.0000 mg | ORAL_TABLET | Freq: Every day | ORAL | 1 refills | Status: AC

## 2018-03-25 NOTE — Discharge Summary (Signed)
Physician Discharge Summary Note  Patient:  Justin [name]. is an 28 y.o., male MRN:  [id number] DOB:  Jan 19, 1991 Patient phone:  [phone] (home)  Patient address:   [address] [place] [address],  Total Time spent with patient: 45 minutes  Date of Admission:  03/19/2018 Date of Discharge: 03/25/18  Reason for Admission:    Mr. Hosp is well-known to the healthcare system, well-known to this examiner due to prior encounters.  He carries a diagnosis of chronic paranoid schizophrenia, he has been stabilized on oral aripiprazole in addition to long-acting injectable Haldol.  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 The patient himself states that his problem is "having trouble speaking and hearing" so he is doing his best to describe his recent mutism. He reports auditory hallucinations recently then denies them he reports no visual hallucinations he tells me he does not want to harm himself.  He has no involuntary movements.  Drug screen is negative for all compounds Last QTC 427 ms - 2/19  Justin [name] assessed him yesterday and her mental status exam is similar to mine, her history is as follows Justin [name].is an 27 y.o.male.Pt presents as walk in at [facility] with his mother. Pt talking to himself, laughing inappropriately, unable to provide any information for TTS assessment. Pt talked to himself throughout the time TTS in the assessment room. Pt did refer to "them talking" at one point and when asked who was talking said it wasn't him and it wasn't [place] (mother or TTS) All information from mother, Justin [name], who pt resides with. Pt has schizophrenia and bipolar diagnosis was  going to [facility] for medication including injections. Stopped going last year, mother believes he has been off medication for about 6 months. Pt mental status has deteriorated in the past week: mother reports he is not sleeping and she can hearhim up and moving around at night. Mother received call from friends who saw pt laying on park bench in the rain. Mother could not find pt for several days and finally found him today. Pt will not answer questions about SI or HI. Mother reports pt has been saying things like "kill the bitch" and other violent or aggressive references. Associated Signs/Symptoms:    Principal Problem: Exacerbation and underlying psychotic disorder Discharge Diagnoses: Active Problems:   Schizophrenia, paranoid type [facility])   Past Medical History:  Past Medical History:  Diagnosis Date  . Abnormal behavior   . Homelessness   . No significant past medical history   . Schizophrenia (HCC)    History reviewed. No pertinent surgical history. Family History:  Family History  Problem Relation Age of Onset  . Depression Father   . Bipolar disorder Father   . Alcohol abuse Father    Family Psychiatric  History: neg Social History:  Social History   Substance and Sexual Activity  Alcohol Use No     Social History   Substance and Sexual Activity  Drug Use No    Social History   Socioeconomic History  . Marital status: Single    Spouse name: Not on file  . Number of children: Not on file  . Years of education: Not on file  . Highest education level: Not on file  Occupational History  . Not on file  Social Needs  . Financial resource strain: Not on file  . Food insecurity:    Worry: Not on file    Inability: Not on file  . Transportation needs:    Medical: Not on file    Non-medical: Not on file  Tobacco Use  . Smoking status: Never Smoker  . Smokeless tobacco: Never Used  Substance and Sexual Activity  . Alcohol use: No  . Drug use: No  .  Sexual activity: Yes  Lifestyle  . Physical activity:    Days per week: Not on file    Minutes per session: Not on file  . Stress: Not on file  Relationships  . Social connections:    Talks on phone: Not on file    Gets together: Not on file    Attends religious service: Not on file    Active member of club or organization: Not on file    Attends meetings of clubs or organizations: Not on file    Relationship status: Not on file  Other Topics Concern  . Not on file  Social History Narrative   ** Merged History [facility] Course:    Once here the patient was generally isolative he began displaying more negative than positive symptoms but was fully compliant with medications.  We lowered the dose of Celexa because we knew he would require higher doses of Haldol than previously prescribed.  It appeared that his long-acting injectable at just 100 mg monthly of Haldol was not holding him so we administered another 50 mg, therefore his next shot will be 150 mg so it looks a little confusing in the chart but he was given his initial 100 mg in a few days later and additional 50, and at the point of the next shot again it will be 150 mg due on around 2/23  By the date of the 25th he had recalibrated what we think is his baseline and his mother was ready to pick him up.  He was alert oriented to person place time day and situation had to be reoriented to the exact date couple of times but could remember it he had no thoughts of harming himself or others and he had no auditory visual loose Nations no EPS or TD no involuntary movements did seem baseline  Physical Findings: AIMS: Facial and Oral Movements Muscles of Facial Expression: None, normal Lips and Perioral Area: None, normal Jaw: None, normal Tongue: None, normal,Extremity Movements Upper (arms, wrists, hands, fingers): None, normal Lower (legs, knees, ankles, toes): None, normal, Trunk Movements Neck, shoulders,  hips: None, normal, Overall Severity Severity of abnormal movements (highest score from questions above): None, normal Incapacitation due to abnormal movements: None, normal Patient's awareness of abnormal movements (rate only patient's report): No Awareness, Dental Status Current problems with teeth and/or dentures?: No Does patient usually wear dentures?: No  CIWA:    COWS:     Musculoskeletal: Strength & Muscle Tone: within normal limits Gait & Station: normal Patient leans: N/A  Psychiatric Specialty Exam: Physical Exam  ROS  Blood pressure 133/62, pulse (!) 106, temperature 98.7 F (37.1 C), temperature source Oral, resp. rate 18, height  (1.676 m), weight 64.4 kg.Body mass index is 22.92 kg/m.  General Appearance: Casual  Eye Contact:  Good  Speech:  Clear and Coherent  Volume:  Decreased  Mood:  Dysphoric in appearance- denies depression  Affect:  Blunt  Thought Process:  Coherent  Orientation:  Full (Time, Place, and Person)  Thought Content:  Tangential  Suicidal Thoughts:  No  Homicidal Thoughts:  No  Memory:  Immediate;   Fair  Judgement:  Fair  Insight:  Fair  Psychomotor Activity:  Normal  Concentration:  Concentration: Fair  Recall:  Fair  Fund of Knowledge:  Fair  Language: Paucity of discussions due to negative symptoms  Akathisia:  Negative  Handed:  Right  AIMS (if indicated):     Assets:  Resilience Social Support  ADL's:  Intact  Cognition:  WNL  Sleep:  Number of Hours: 5.75     Have you used any form of tobacco in the last 30 days? (Cigarettes, Smokeless Tobacco, Cigars, and/or Pipes): No  Has this patient used any form of tobacco in the last 30 days? (Cigarettes, Smokeless Tobacco, Cigars, and/or Pipes) Yes, No  Blood Alcohol level:  Lab Results  Component Value Date   ETH <10 03/18/2018   ETH <5 07/08/2015    Metabolic Disorder Labs:  Lab Results  Component Value Date   HGBA1C 4.7 (L) 07/19/2015   MPG 88 07/19/2015   No  results found for: PROLACTIN Lab Results  Component Value Date   CHOL 167 07/19/2015   TRIG 60 07/19/2015   HDL 42 07/19/2015   CHOLHDL 4.0 07/19/2015   VLDL 12 07/19/2015   LDLCALC 113 (H) 07/19/2015    See Psychiatric Specialty Exam and Suicide Risk Assessment completed by Attending Physician prior to discharge.  Discharge destination:  Home  Is patient on multiple antipsychotic therapies at discharge:  No   Has Patient had three or more failed trials of antipsychotic monotherapy by history:  No  Recommended Plan for Multiple Antipsychotic Therapies: NA   Allergies as of 03/25/2018   No Known Allergies     Medication List    STOP taking these medications   ARIPiprazole 10 MG tablet Commonly known as:  ABILIFY   diphenhydrAMINE 25 MG tablet Commonly known as:  BENADRYL   LORazepam 1 MG tablet Commonly known as:  ATIVAN   traZODone 100 MG tablet Commonly known as:  DESYREL     TAKE these medications     Indication  benztropine 1 MG tablet Commonly known as:  COGENTIN Take 1 tablet (1 mg total) by mouth 2 (two) times daily. What changed:  additional instructions  Indication:  Extrapyramidal Reaction caused by Medications   citalopram 20 MG tablet Commonly known as:  CELEXA Take 1 tablet (20 mg total) by mouth daily. Start taking on:  March 26, 2018 What changed:    medication strength  how much to take  additional instructions  Indication:  Depression   cyanocobalamin 1000 MCG tablet Take 1 tablet (1,000 mcg total) by mouth daily. For low B-12 replacement  Indication:  Inadequate Vitamin B12, Vitamin B12 Absorption Study   haloperidol 10 MG tablet Commonly known as:  HALDOL Bid x 2 weeks then 1 at  hs  Indication:  Psychosis   haloperidol decanoate 100 MG/ML injection Commonly known as:  HALDOL DECANOATE Inject 1.5 mLs (150 mg total) into the muscle every 28 (twenty-eight) days. Start taking on:  April 16, 2018 What changed:    how much  to take  additional instructions  Indication:  Mood control      Follow-up Information    [facility] Follow up on 03/31/2018.   Why:  Hospital follow up appointment is Monday, 3/2 at 8:00a.  Please bring your  photo ID, proof of insurance, SSN, current medications and discharge paperwork from this hospitalization.  Contact information: [address] [place] [address] [phone]           Follow-up recommendations:  Activity:  full  SignedMalvin Johns, MD 03/25/2018, 9:03 AM

## 2018-03-25 NOTE — Progress Notes (Signed)
Phone message from [place], TCT.  She has paperwork to initiate services and will coordinate with family. CSW spoke with Justin [name] mother, Justin [name].  She spoke with TCT team on the phone and is all set to have them work with Justin [name] upon discharge. Garner Nash, MSW, LCSW Clinical Social Worker 03/25/2018 9:07 AM

## 2018-03-25 NOTE — Progress Notes (Signed)
Discharge note: Patient reviewed discharge paperwork with RN including prescriptions, follow up appointments, and lab work. Patient given the opportunity to ask questions. All concerns were addressed. All belongings were returned to patient. Denied SI/HI/AVH. Patient thanked staff for their care while at the hospital.  Patient was discharged to lobby where his mom was waiting to pick him up.

## 2018-03-25 NOTE — [facility] Suicide Risk Assessment (Signed)
[facility] Discharge Suicide Risk Assessment   Principal Problem: Exacerbation and underlying psychotic disorder Discharge Diagnoses: Active Problems:   Schizophrenia, paranoid type (HCC)   Total Time spent with patient: 45 minutes Currently is alert and oriented and cooperative no acute psychosis better insight no EPS or TD no thoughts of harming self or others no involuntary movements  Mental Status Per Nursing Assessment::   On Admission:  NA  Demographic Factors:  Unemployed  Loss Factors: Decrease in vocational status  Historical Factors: Impulsivity  Risk Reduction Factors:   Positive social support  Continued Clinical Symptoms:  Schizophrenia:   Paranoid or undifferentiated type  Cognitive Features That Contribute To Risk:  Loss of executive function    Suicide Risk:  Minimal: No identifiable suicidal ideation.  Patients presenting with no risk factors but with morbid ruminations; may be classified as minimal risk based on the severity of the depressive symptoms  Follow-up Information    [facility] Follow up on 03/31/2018.   Why:  Hospital follow up appointment is Monday, 3/2 at 8:00a.  Please bring your photo ID, proof of insurance, SSN, current medications and discharge paperwork from this hospitalization.  Contact information: [address] [place] [address] [phone]           Plan Of Care/Follow-up recommendations:  Activity:  full  Justin Latella, MD 03/25/2018, 8:59 AM

## 2018-03-25 NOTE — Plan of Care (Signed)
Pt did not attend recreation therapy group sessions.   Rithika Seel, LRT/CTRS 

## 2018-03-25 NOTE — Progress Notes (Signed)
  [facility] Adult Case Management Discharge Plan :  Will you be returning to the same living situation after discharge:  Yes,  with mother At discharge, do you have transportation home?: Yes,  mother Do you have the ability to pay for your medications: No. Will work with [employer].   Release of information consent forms completed and in the chart;  Patient's signature needed at discharge.  Patient to Follow up at: Follow-up Information    [facility] Follow up on 03/31/2018.   Why:  Hospital follow up appointment is Monday, 3/2 at 8:00a. The Transition Care Team will also be working with you.  Please contact Estelle June, team lead, at [phone] or Mauri Brooklyn, [phone], to initiate services.  Contact information: [address] [place] [address] [phone]           Next level of care provider has access to [facility] Link:no  Safety Planning and Suicide Prevention discussed: Yes,  with mother  Have you used any form of tobacco in the last 30 days? (Cigarettes, Smokeless Tobacco, Cigars, and/or Pipes): No  Has patient been referred to the Quitline?: N/A patient is not a smoker  Patient has been referred for addiction treatment: N/A  Lorri Frederick, LCSW 03/25/2018, 9:10 AM

## 2018-03-25 NOTE — Progress Notes (Signed)
Recreation Therapy Notes  INPATIENT RECREATION TR PLAN  Patient Details Name: Justin [name]. MRN: [id number] DOB: [birth year] Today's Date: 03/25/2018  Rec Therapy Plan Is patient appropriate for Therapeutic Recreation?: Yes Treatment times per week: about 3 days Estimated Length of Stay: 5-7 days TR Treatment/Interventions: Group participation (Comment)  Discharge Criteria Pt will be discharged from therapy if:: Discharged Treatment plan/goals/alternatives discussed and agreed upon by:: Patient/family  Discharge Summary Short term goals set: See patient care plan Short term goals met: Not met Reason goals not met: Pt did not attend recreation theapy group sessions. Therapeutic equipment acquired: N/A Reason patient discharged from therapy: Discharge from hospital Pt/family agrees with progress & goals achieved: Yes Date patient discharged from therapy: 03/25/18     Victorino Sparrow, LRT/CTRS  Ria Comment, [name] 03/25/2018, 8:55 AM

## 2018-03-25 NOTE — Plan of Care (Signed)
  Problem: Education: Goal: Emotional status will improve Outcome: Adequate for Discharge   Problem: Education: Goal: Mental status will improve Outcome: Adequate for Discharge   Problem: Education: Goal: Verbalization of understanding the information provided will improve Outcome: Adequate for Discharge   Problem: Activity: Goal: Interest or engagement in activities will improve Outcome: Adequate for Discharge   Problem: Activity: Goal: Sleeping patterns will improve Outcome: Adequate for Discharge   Problem: Coping: Goal: Ability to verbalize frustrations and anger appropriately will improve Outcome: Adequate for Discharge

## 2018-04-30 ENCOUNTER — Inpatient Hospital Stay: Payer: Self-pay | Admitting: Family Medicine

## 2018-06-09 ENCOUNTER — Ambulatory Visit (HOSPITAL_COMMUNITY)
Admission: RE | Admit: 2018-06-09 | Discharge: 2018-06-09 | Disposition: A | Discharge status: Home / Self Care | Payer: Medicaid Other | Attending: Psychiatry | Admitting: Psychiatry

## 2018-06-09 DIAGNOSIS — F332 Major depressive disorder, recurrent severe without psychotic features: Secondary | ICD-10-CM | POA: Diagnosis not present

## 2018-06-09 DIAGNOSIS — R251 Tremor, unspecified: Secondary | ICD-10-CM | POA: Diagnosis not present

## 2018-06-09 DIAGNOSIS — Z79899 Other long term (current) drug therapy: Secondary | ICD-10-CM | POA: Insufficient documentation

## 2018-06-09 NOTE — BH Assessment (Signed)
Assessment Note Per BH FNP Justin [name]: Justin [name]. is an 28 y.o. male presenting as a walk-in to [facility] accompanied with his mother. As per patient, he is not tolerating his medications well. He states, he is on Haldol (injectable) and he is having trouble functioning when this medication is taken. As per guardian. Patient has been on Haldol injectable and p.o. for the past several months. She states just recently, patient has begin to complain of not being able to function, experiencing tremors, racing thoughts, negative thoughts and decreased energy. As per guardian, patient is receiving medication management through [facility]. Both patient and mother denies suicidal thoughts, homicidal thoughts/ideas, paranoia, hallucinations, or other psychotic process. His psychiatric history is remarkable for schizophrenia. He denies other negative symptoms associated with schizophrenia.Marland KitchenHe denies substance abuse or use. He does not appear internally preoccupied.   TTS: Patient presentes to [facility] with his mother seeking medication changes stating that he does not feel like his medication is working.  Patient states that he has been feeling drained, has no energy and states that he has been feeling sick and vomiting.  He states that he has been shaking a lot and unable to function. Patient states that he was last hospitalized at [facility] six months ago and states that he is seen at [facility] on an outpatient basis.  Patient denies SI/HI/Psychosis. Patient states that he does not use any drugs or alcohol.  He states that his sleep has decreased due to his racing thoughts and states that his appetite has decreased.  He denies any history of abuse or self-mutilation.  Patient's mother. Philis Nettle, provided collateral information.  She states that patient has never been suicidal, homicidal or psychotic.  She states that she has noticed changes in him over the past couple weeks.  She states that she feels like his  medication is not working and is dragging him down.  She states that he is experiencing motor skill impairment, tremors, blurred vision and he has not been able to hold things in his hand.  She states that his thoughts have been telling him that he is a loser, he cannot do anything and he needs to give up because he will never amount to anything.  She states that he has been crying and his speech has also been slurred.  Mother states that she does not feel like he would do anything to harm himself.   Diagnosis: F33.2 MDD, Recurrent Severe without psychosis  Past Medical History:  Past Medical History:  Diagnosis Date  . Abnormal behavior   . Homelessness   . No significant past medical history   . Schizophrenia (HCC)     No past surgical history on file.  Family History:  Family History  Problem Relation Age of Onset  . Depression Father   . Bipolar disorder Father   . Alcohol abuse Father     Social History:  reports that he has never smoked. He has never used smokeless tobacco. He reports that he does not drink alcohol or use drugs.  Additional Social History:  Alcohol / Drug Use Pain Medications: see MAR Prescriptions: see MAR Over the Counter: see MAR History of alcohol / drug use?: Yes Longest period of sobriety (when/how long): has not used since his last hospital adm 6 months ago Negative Consequences of Use: Financial, Personal relationships  CIWA:   COWS:    Allergies: No Known Allergies  Home Medications: (Not in a hospital admission)   OB/GYN Status:  No LMP for male patient.  General Assessment Data [facility] TTS Assessment: In system Is this a Tele or Face-to-Face Assessment?: Face-to-Face Is this an Initial Assessment or a Re-assessment for this encounter?: Initial Assessment Patient Accompanied by:: Parent Language Other than English: No Living Arrangements: Other (Comment) What gender do you identify as?:  Male Marital status: Single Living Arrangements: Parent Can pt return to current living arrangement?: Yes Admission Status: Voluntary Is patient capable of signing voluntary admission?: Yes Referral Source: Self/Family/Friend Insurance type: (medicaid)  Medical Screening Exam [facility] Walk-in ONLY) Medical Exam completed: Yes  Crisis Care Plan Living Arrangements: Parent Legal Guardian: Other: Name of Psychiatrist: Museum/gallery curator([facility]) Name of Therapist: [facility]  Education Status Is patient currently in school?: No Is the patient employed, unemployed or receiving disability?: Unemployed  Risk to self with the past 6 months Suicidal Ideation: No Has patient been a risk to self within the past 6 months prior to admission? : No Suicidal Intent: No Has patient had any suicidal intent within the past 6 months prior to admission? : No Is patient at risk for suicide?: No Suicidal Plan?: No Has patient had any suicidal plan within the past 6 months prior to admission? : No Access to Means: No What has been your use of drugs/alcohol within the last 12 months?: (none reported) Previous Attempts/Gestures: No How many times?: (none) Other Self Harm Risks: none Triggers for Past Attempts: None known Intentional Self Injurious Behavior: None Family Suicide History: No Recent stressful life event(s): Job Loss Persecutory voices/beliefs?: Yes Depression: No Depression Symptoms: Despondent, Insomnia, Isolating, Loss of interest in usual pleasures, Feeling worthless/self pity Substance abuse history and/or treatment for substance abuse?: No Suicide prevention information given to non-admitted patients: Not applicable  Risk to Others within the past 6 months Homicidal Ideation: No Does patient have any lifetime risk of violence toward others beyond the six months prior to admission? : No Thoughts of Harm to Others: No Current Homicidal Intent: No Current Homicidal Plan: No Access to Homicidal Means:  No Identified Victim: none History of harm to others?: No Assessment of Violence: None Noted Violent Behavior Description: none Does patient have access to weapons?: No Criminal Charges Pending?: No Does patient have a court date: No Is patient on probation?: No  Psychosis Hallucinations: None noted Delusions: None noted  Mental Status Report Appearance/Hygiene: Unremarkable Eye Contact: Good Motor Activity: Restlessness Speech: Logical/coherent Level of Consciousness: Alert Mood: Depressed Affect: Appropriate to circumstance Anxiety Level: Moderate Thought Processes: Coherent, Relevant Judgement: Impaired Orientation: Person, Place, Time, Situation Obsessive Compulsive Thoughts/Behaviors: None  Cognitive Functioning Concentration: Decreased Memory: Recent Intact, Remote Intact Is patient IDD: No Insight: Fair Impulse Control: Poor Appetite: Poor Have you had any weight changes? : (unknown) Sleep: Decreased Total Hours of Sleep: (4) Vegetative Symptoms: None  ADLScreening [facility] Assessment Services) Patient's cognitive ability adequate to safely complete daily activities?: Yes Patient able to express need for assistance with ADLs?: Yes Independently performs ADLs?: Yes (appropriate for developmental age)  Prior Inpatient Therapy Prior Inpatient Therapy: Yes Prior Therapy Dates: 6 mths ago [facility] Reason for Treatment: depression  Prior Outpatient Therapy Prior Outpatient Therapy: Yes Prior Therapy Dates: active Prior Therapy Facilty/Provider(s): [facility] Reason for Treatment: depression Does patient have an ACCT team?: No Does patient have Intensive In-House Services?  : No Does patient have [facility] services? : No Does patient have P4CC services?: No  ADL Screening (condition at time of admission) Patient's cognitive ability adequate to safely  complete daily activities?: Yes Is the patient deaf or have difficulty hearing?:  No Does the patient have difficulty seeing, even when wearing glasses/contacts?: No Does the patient have difficulty concentrating, remembering, or making decisions?: No Patient able to express need for assistance with ADLs?: Yes Does the patient have difficulty dressing or bathing?: No Independently performs ADLs?: Yes (appropriate for developmental age) Does the patient have difficulty walking or climbing stairs?: No Weakness of Legs: None Weakness of Arms/Hands: None  Home Assistive Devices/Equipment Home Assistive Devices/Equipment: None  Therapy Consults (therapy consults require a physician order) PT Evaluation Needed: No OT Evalulation Needed: No SLP Evaluation Needed: No Abuse/Neglect Assessment (Assessment to be complete while patient is alone) Abuse/Neglect Assessment Can Be Completed: Yes Physical Abuse: Denies Verbal Abuse: Denies Sexual Abuse: Denies Self-Neglect: Denies Values / Beliefs Cultural Requests During Hospitalization: None Spiritual Requests During Hospitalization: None Consults Spiritual Care Consult Needed: No Social Work Consult Needed: No Merchant navy officer (For Healthcare) Does Patient Have a Medical Advance Directive?: No Would patient like information on creating a medical advance directive?: No - Patient declined Nutrition Screen- [facility] Adult/WL/AP Has the patient recently lost weight without trying?: No Has the patient been eating poorly because of a decreased appetite?: No Malnutrition Screening Tool Score: 0        Disposition: Per Denzil Magnuson, NP, patient does not meet inpatient admission criteria and can follow up with [facility]. Disposition Initial Assessment Completed for this Encounter: Yes Disposition of Patient: Discharge Patient refused recommended treatment: No Mode of transportation if patient is discharged/movement?: Car Patient referred to: [facility])  On Site Evaluation by:   Reviewed with Physician:    Arnoldo Lenis  Sonia Stickels 06/09/2018 11:25 AM

## 2018-06-09 NOTE — H&P (Signed)
Behavioral Health Medical Screening Exam  Justin [name] [name]. is an 28 y.o. male presenting as a walk-in to [facility] accompanied with his mother. As per patient, he is not tolerating his medications well. He states, he is on Haldol (injectable) and he is having trouble functioning when this medication is taken. As per guardian. Patient has been on Haldol injectable and p.o. for the past several months. She states just recently, patient has begin to complain of not being able to function, experiencing tremors, racing thoughts, negative thoughts and decreased energy. As per guardian, patient is receiving medication management through [facility]. Both patient and mother denies suicidal thoughts, homicidal thoughts/ideas, paranoia, hallucinations, or other psychotic process. His psychiatric history is remarkable for schizophrenia. He denies other negative symptoms associated with schizophrenia.Marland KitchenHe denies substance abuse or use. He does not appear internally preoccupied.     Total Time spent with patient: 20 minutes  Psychiatric Specialty Exam: Physical Exam  Nursing note and vitals reviewed. Constitutional: He is oriented to person, place, and time.  Neurological: He is alert and oriented to person, place, and time.    Review of Systems  Psychiatric/Behavioral: Positive for depression. Negative for hallucinations, memory loss, substance abuse and suicidal ideas. The patient is not nervous/anxious and does not have insomnia.   All other systems reviewed and are negative.   There were no vitals taken for this visit.There is no height or weight on file to calculate BMI.  General Appearance: Well Groomed  Eye Contact:  Good  Speech:  Clear and Coherent and Normal Rate  Volume:  Normal  Mood:  Depressed  Affect:  Appropriate  Thought Process:  Coherent, Goal Directed and Descriptions of Associations: Intact  Orientation:  Full (Time, Place, and Person)  Thought Content:  Logical denies  hallucinations   Suicidal Thoughts:  No  Homicidal Thoughts:  No  Memory:  Immediate;   Fair Recent;   Fair  Judgement:  Intact  Insight:  Fair  Psychomotor Activity:  Normal  Concentration: Concentration: Fair and Attention Span: Fair  Recall:  [employer] of Knowledge:Fair  Language: Good  Akathisia:  Negative  Handed:  Right  AIMS (if indicated):     Assets:  Communication Skills Desire for Improvement Resilience Social Support  Sleep:       Musculoskeletal: Strength & Muscle Tone: within normal limits Gait & Station: normal Patient leans: N/A  There were no vitals taken for this visit.  Recommendations:  Based on my evaluation the patient does not appear to have an emergency medical condition.   No evidence of imminent risk to self or others at present.   Patient does not meet criteria for psychiatric inpatient admission. Reccommended to call [facility] to speak to provider about current medications so appropriate adjustments can be made if needed.     Denzil Magnuson, NP 06/09/2018, 11:08 AM

## 2018-12-20 ENCOUNTER — Other Ambulatory Visit: Payer: Self-pay

## 2018-12-20 ENCOUNTER — Emergency Department (HOSPITAL_COMMUNITY)
Admission: EM | Admit: 2018-12-20 | Discharge: 2018-12-20 | Disposition: A | Discharge status: Home / Self Care | Payer: Medicaid Other | Attending: Emergency Medicine | Admitting: Emergency Medicine

## 2018-12-20 DIAGNOSIS — R0981 Nasal congestion: Secondary | ICD-10-CM | POA: Diagnosis present

## 2018-12-20 DIAGNOSIS — Z79899 Other long term (current) drug therapy: Secondary | ICD-10-CM | POA: Diagnosis not present

## 2018-12-20 DIAGNOSIS — F200 Paranoid schizophrenia: Secondary | ICD-10-CM | POA: Diagnosis not present

## 2018-12-20 DIAGNOSIS — R0602 Shortness of breath: Secondary | ICD-10-CM | POA: Diagnosis not present

## 2018-12-20 DIAGNOSIS — Z590 Homelessness: Secondary | ICD-10-CM | POA: Diagnosis not present

## 2018-12-20 MED ORDER — AEROCHAMBER PLUS FLO-VU MEDIUM MISC
1.0000 | Freq: Once | Status: DC
  Filled 2018-12-20: qty 1

## 2018-12-20 MED ORDER — ALBUTEROL SULFATE HFA 108 (90 BASE) MCG/ACT IN AERS
4.0000 | INHALATION_SPRAY | Freq: Once | RESPIRATORY_TRACT | Status: AC
  Administered 2018-12-20: 4 via RESPIRATORY_TRACT
  Filled 2018-12-20: qty 6.7

## 2018-12-20 MED ORDER — AEROCHAMBER PLUS FLO-VU LARGE MISC: 1.0000 | Freq: Once | Status: DC

## 2018-12-20 NOTE — ED Triage Notes (Signed)
Pt states since September he has had nasal congestion and it has recently become worse. Pt states he is unable to sleep at night due to congestion,. States he tried an OTC medication, cant remember the name, but it wasn't helping.

## 2018-12-20 NOTE — Discharge Instructions (Addendum)
You can use over the counter nasal saline and flonase for your nasal congestion. I suspect most of your symptoms are related to allergies, so you can also take over the counter allergy medicine. I have provided you with a number for a primary care doctor. Call to schedule an appointment if your nasal congestion does not improve over the next week. Return to the ER for new or worsening symptoms.

## 2018-12-20 NOTE — ED Notes (Signed)
Patient verbalizes understanding of discharge instructions. Opportunity for questioning and answers were provided. Armband removed by staff, pt discharged from ED.  

## 2018-12-20 NOTE — ED Provider Notes (Signed)
[facility] EMERGENCY DEPARTMENT Provider Note   CSN: [id number] Arrival date & time: 12/20/18  1313     History   Chief Complaint Chief Complaint  Patient presents with  . Nasal Congestion    HPI Justin [name]. is a 28 y.o. male with a past medical history significant for homelessness, schizophrenia, vitamin B12 deficiency, and TSH deficiency who presents to the ED for an evaluation of gradual onset of constant nasal congestion since September. Justin [name] has tried over-the-counter pills for his nasal congestion but is unsure of the name.  Justin [name] notes the pills have not worked. Patient denies sick contacts. No fever/chills. Denies headache and sinus pressure. Patient notes nasal congestion is associated with difficulties breathing which is worse at night. No history of asthma. Patient denies chest pain, abdominal pain, epistaxis, cough, nausea, vomiting, diarrhea, and abdominal pain.  Past Medical History:  Diagnosis Date  . Abnormal behavior   . Homelessness   . No significant past medical history   . Schizophrenia [facility])     Patient Active Problem List   Diagnosis Date Noted  . Schizophrenia, paranoid type (HCC) 07/18/2015  . Acute psychosis (HCC)   . Mild cognitive disorder 02/01/2015  . Hx of epistaxis 02/01/2015  . Vitamin B12 deficiency 01/22/2015  . 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 No significant past medical history     No past surgical history on file.      Home Medications    Prior to Admission medications   Medication Sig Start Date End Date Taking? Authorizing Provider  benztropine (COGENTIN) 1 MG tablet Take 1 tablet (1 mg total) by mouth 2 (two) times daily. 03/25/18   Malvin Johns, MD  citalopram (CELEXA) 20 MG tablet Take 1 tablet (20 mg total) by mouth daily. 03/26/18   Malvin Johns, MD  haloperidol (HALDOL) 10 MG tablet Bid x 2 weeks then 1 at  hs 03/25/18   Malvin Johns, MD  haloperidol decanoate (HALDOL DECANOATE) 100 MG/ML injection  Inject 1.5 mLs (150 mg total) into the muscle every 28 (twenty-eight) days. 04/16/18   Malvin Johns, MD  vitamin B-12 1000 MCG tablet Take 1 tablet (1,000 mcg total) by mouth daily. For low B-12 replacement 07/27/15   Sanjuana Kava, NP    Family History Family History  Problem Relation Age of Onset  . Depression Father   . Bipolar disorder Father   . Alcohol abuse Father     Social History Social History   Tobacco Use  . Smoking status: Never Smoker  . Smokeless tobacco: Never Used  Substance Use Topics  . Alcohol use: No  . Drug use: No     Allergies   Patient has no known allergies.   Review of Systems Review of Systems  Constitutional: Negative for chills and fever.  HENT: Positive for congestion, postnasal drip and rhinorrhea. Negative for ear discharge, ear pain, sinus pressure, sinus pain, sneezing, trouble swallowing and voice change.   Respiratory: Positive for shortness of breath. Negative for wheezing.   Cardiovascular: Negative for chest pain and leg swelling.     Physical Exam Updated Vital Signs BP 122/83 (BP Location: Right Arm)   Pulse (!) 106   Temp 98 F (36.7 C) (Oral)   Resp (!) 24   SpO2 97%   Physical Exam Vitals signs and nursing note reviewed.  Constitutional:      General: Justin [name] is not in acute distress.    Appearance: Justin [name] is not ill-appearing.  HENT:  Head: Normocephalic.     Right Ear: Tympanic membrane normal.     Left Ear: Tympanic membrane normal.     Nose: Congestion present.     Comments: Boggy nasal turbinates bilaterally with nasal congestion. No tenderness to palpation over maxillary or frontal sinus.    Mouth/Throat:     Pharynx: No oropharyngeal exudate or posterior oropharyngeal erythema.     Comments: Posterior oropharynx clear and mucous membranes moist, uvula midline, post-nasal drip present. No erythema or exudates.  Neck:     Musculoskeletal: Normal range of motion and neck supple. No neck rigidity or muscular  tenderness.  Cardiovascular:     Rate and Rhythm: Regular rhythm. Tachycardia present.     Pulses: Normal pulses.     Heart sounds: Normal heart sounds. No murmur. No friction rub. No gallop.   Pulmonary:     Effort: Pulmonary effort is normal.     Breath sounds: Normal breath sounds.     Comments: Respirations equal and unlabored, patient able to speak in full sentences, lungs clear to auscultation bilaterally  Abdominal:     General: Abdomen is flat. There is no distension.     Palpations: Abdomen is soft.     Tenderness: There is no abdominal tenderness. There is no guarding.  Skin:    General: Skin is warm.  Neurological:     General: No focal deficit present.     Mental Status: Justin [name] is alert.      ED Treatments / Results  Labs (all labs ordered are listed, but only abnormal results are displayed) Labs Reviewed - No data to display  EKG None  Radiology No results found.  Procedures Procedures (including critical care time)  Medications Ordered in ED Medications  AeroChamber Plus Flo-Vu Large MISC 1 each (has no administration in time range)  albuterol (VENTOLIN HFA) 108 (90 Base) MCG/ACT inhaler 4 puff (4 puffs Inhalation Given 12/20/18 1450)     Initial Impression / Assessment and Plan / ED Course  I have reviewed the triage vital signs and the nursing notes.  Pertinent labs & imaging results that were available during my care of the patient were reviewed by me and considered in my medical decision making (see chart for details).       28 year old male presents to the ED for evaluation of nasal congestion since September. Patient denies history of allergies. Patient is afebrile and non-ill appearing. Patient in no acute distress. Patent nasal passage bilaterally with boggy turbinates and nasal congestion. Lungs clear to ausculation bilaterally. Suspect nasal congestion related to allergic rhinitis. Low suspicion for sinusitis given no tenderness to palpation  over sinuses. Will treat symptomatically with OTC nasal saline and Flonase. PCP number given to patient. Patient advised to call and schedule an appointment with PCP if symptoms do not improve within the next week. Strict ED precautions discussed with patient. Patient states understanding and agrees to plan. Patient discharged home in no acute distress and vitals within normal limits.   Final Clinical Impressions(s) / ED Diagnoses   Final diagnoses:  Nasal congestion    ED Discharge Orders    None       Romie Levee 12/20/18 2143    Tegeler, Gwenyth Allegra, MD 12/21/18 [phone]

## 2019-01-24 IMAGING — DX DG FINGER LITTLE 2+V*R*
3 series · 3 of 3 positions shown · non-contrast
Comparison: None.

CLINICAL DATA: Rail fell on RIGHT little finger tonight at work.
Pain and laceration.

EXAM:
RIGHT LITTLE FINGER 2+V

[finger ap]
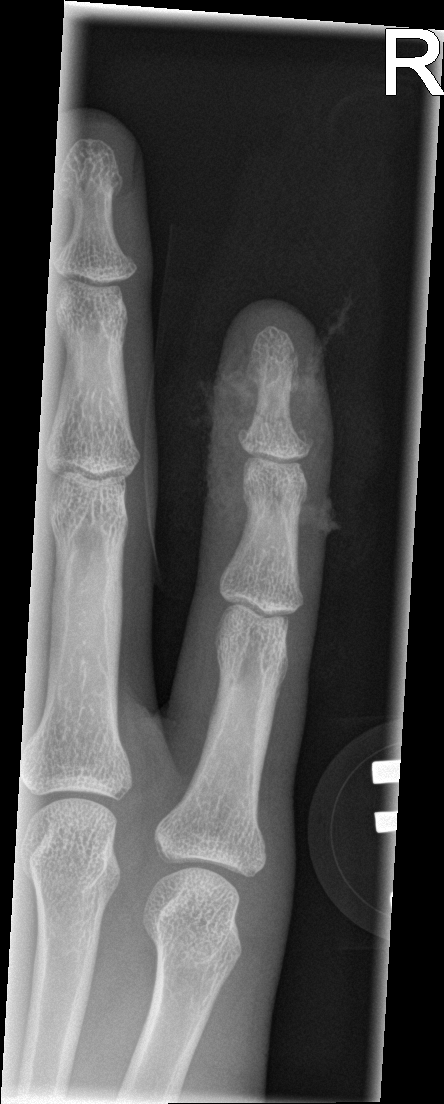

[finger obl]
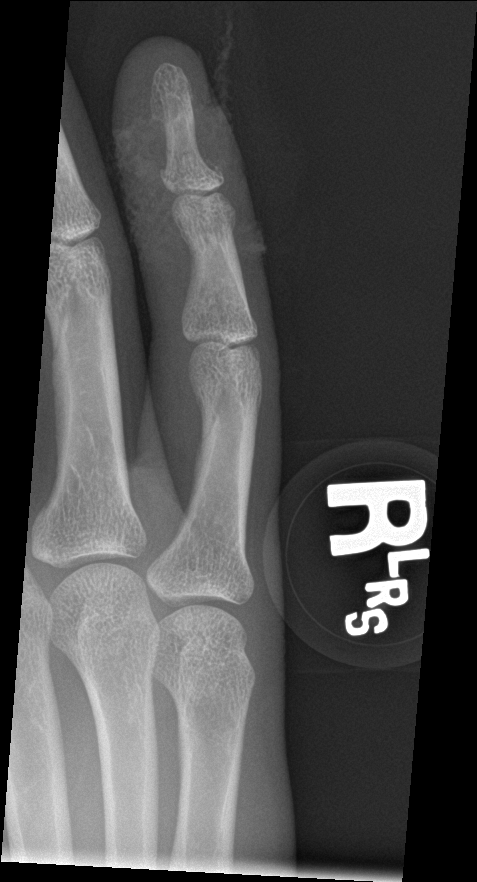

[finger lat]
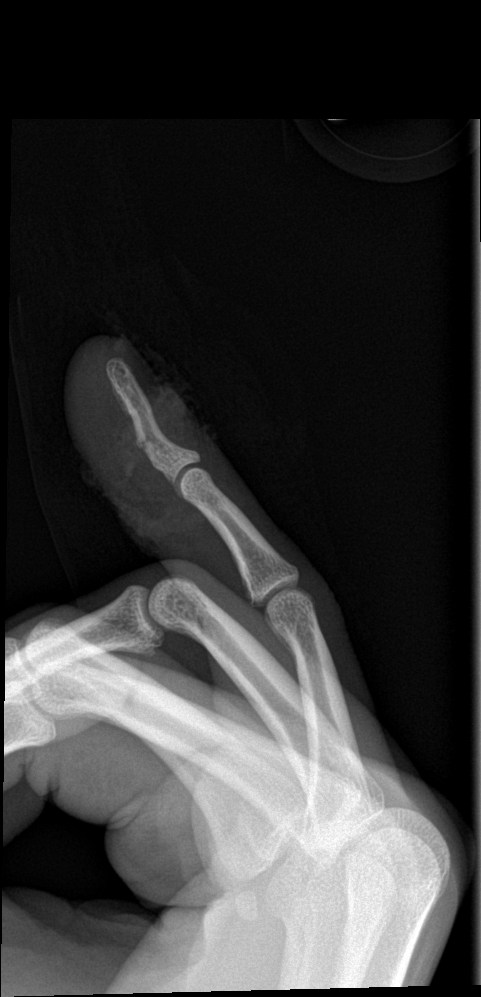

[3 of 3 positions shown; findings below may reference images not displayed]

FINDINGS: Acute transverse fracture through fifth distal phalanx without
intra-articular extension, slight dorsal angulation of the distal
bony fragments. No dislocation. No destructive bony lesions. Bandage
overlies distal fifth finger without subcutaneous gas or radiopaque
foreign bodies.
IMPRESSION: Acute mildly displaced fracture fifth distal phalanx. No
dislocation.

## 2020-03-31 ENCOUNTER — Encounter (HOSPITAL_COMMUNITY): Payer: Self-pay | Admitting: Emergency Medicine

## 2020-03-31 ENCOUNTER — Other Ambulatory Visit: Payer: Self-pay

## 2020-03-31 ENCOUNTER — Ambulatory Visit (HOSPITAL_COMMUNITY)
Admission: EM | Admit: 2020-03-31 | Discharge: 2020-03-31 | Disposition: A | Discharge status: Home / Self Care | Payer: Medicaid Other

## 2020-03-31 DIAGNOSIS — F200 Paranoid schizophrenia: Secondary | ICD-10-CM

## 2020-03-31 DIAGNOSIS — L905 Scar conditions and fibrosis of skin: Secondary | ICD-10-CM

## 2020-03-31 NOTE — Discharge Instructions (Signed)
-  Your scar tissue is completely benign.  -You can ask to transition to the oral medication instead of injections, if you want to minimize further scar tissue.

## 2020-03-31 NOTE — ED Triage Notes (Signed)
Pt presents with knot on right bicep. States get injections monthly and knot appeared after last injection.

## 2020-03-31 NOTE — ED Provider Notes (Addendum)
[facility]    CSN: [id number] Arrival date & time: 03/31/20  1254      History   Chief Complaint Chief Complaint  Patient presents with  . Arm Pain    Right    HPI Justin [name]. is a 30 y.o. male presenting with scar tissue in the right arm from monthly Haldol injection.  History of paranoid schizophrenia, homelessness, psychosis, cognitive disorder, B12 deficiency, TSH deficiency. States he gets monthly Haldol injection and he is concerned that it caused a lump in his arm. Denies pain, fevers/chills, discharge.   HPI  Past Medical History:  Diagnosis Date  . Abnormal behavior   . Homelessness   . No significant past medical history   . Schizophrenia [facility])     Patient Active Problem List   Diagnosis Date Noted  . Schizophrenia, paranoid type (HCC) 07/18/2015  . Acute psychosis (HCC)   . Mild cognitive disorder 02/01/2015  . Hx of epistaxis 02/01/2015  . Vitamin B12 deficiency 01/22/2015  . 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 No significant past medical history     History reviewed. No pertinent surgical history.     Home Medications    Prior to Admission medications   Medication Sig Start Date End Date Taking? Authorizing Provider  benztropine (COGENTIN) 1 MG tablet Take 1 tablet (1 mg total) by mouth 2 (two) times daily. 03/25/18   Malvin Johns, MD  citalopram (CELEXA) 20 MG tablet Take 1 tablet (20 mg total) by mouth daily. 03/26/18   Malvin Johns, MD  haloperidol (HALDOL) 10 MG tablet Bid x 2 weeks then 1 at  hs 03/25/18   Malvin Johns, MD  haloperidol decanoate (HALDOL DECANOATE) 100 MG/ML injection Inject 1.5 mLs (150 mg total) into the muscle every 28 (twenty-eight) days. 04/16/18   Malvin Johns, MD  vitamin B-12 1000 MCG tablet Take 1 tablet (1,000 mcg total) by mouth daily. For low B-12 replacement 07/27/15   Sanjuana Kava, NP    Family History Family History  Problem Relation Age of Onset  . Depression Father   . Bipolar disorder  Father   . Alcohol abuse Father     Social History Social History   Tobacco Use  . Smoking status: Never Smoker  . Smokeless tobacco: Never Used  Substance Use Topics  . Alcohol use: No  . Drug use: No     Allergies   Patient has no known allergies.   Review of Systems Review of Systems  Skin:       Lump in arm  All other systems reviewed and are negative.    Physical Exam Triage Vital Signs ED Triage Vitals  Enc Vitals Group     BP 03/31/20 1336 124/80     Pulse Rate 03/31/20 1336 70     Resp 03/31/20 1336 18     Temp 03/31/20 1336 98.1 F (36.7 C)     Temp Source 03/31/20 1336 Oral     SpO2 03/31/20 1336 100 %     Weight --      Height --      Head Circumference --      Peak Flow --      Pain Score 03/31/20 1334 3     Pain Loc --      Pain Edu? --      Excl. in GC? --    No data found.  Updated Vital Signs BP 124/80 (BP Location: Left Arm)   Pulse 70  Temp 98.1 F (36.7 C) (Oral)   Resp 18   SpO2 100%   Visual Acuity Right Eye Distance:   Left Eye Distance:   Bilateral Distance:    Right Eye Near:   Left Eye Near:    Bilateral Near:     Physical Exam Vitals reviewed.  Constitutional:      Appearance: Normal appearance.  Cardiovascular:     Rate and Rhythm: Normal rate and regular rhythm.     Heart sounds: Normal heart sounds.  Pulmonary:     Effort: Pulmonary effort is normal.     Breath sounds: Normal breath sounds.  Skin:    Comments: Right bicep with small .5cm mobile firm nodule. No erythema, warmth, discharge.  Neurological:     General: No focal deficit present.     Mental Status: He is alert and oriented to person, place, and time.  Psychiatric:        Attention and Perception: Attention normal.        Mood and Affect: Mood is anxious.        Thought Content: Thought content normal.      UC Treatments / Results  Labs (all labs ordered are listed, but only abnormal results are displayed) Labs Reviewed - No data to  display  EKG   Radiology No results found.  Procedures Procedures (including critical care time)  Medications Ordered in UC Medications - No data to display  Initial Impression / Assessment and Plan / UC Course  I have reviewed the triage vital signs and the nursing notes.  Pertinent labs & imaging results that were available during my care of the patient were reviewed by me and considered in my medical decision making (see chart for details).     This patient is a 30 year old male presenting with scar tissue R bicep from monthly Haldol injections, which he receives for paranoid schizophrenia. Reassurance provided. Discussed that he could consider switching to oral medication for this if his physician is in agreement. Patient verbalizes understanding and agreement.   This chart was dictated using voice recognition software, Dragon. Despite the best efforts of this provider to proofread and correct errors, errors may still occur which can change documentation meaning.   Final Clinical Impressions(s) / UC Diagnoses   Final diagnoses:  Scar tissue  Schizophrenia, paranoid (HCC)     Discharge Instructions     -Your scar tissue is completely benign.  -You can ask to transition to the oral medication instead of injections, if you want to minimize further scar tissue.    ED Prescriptions    None     PDMP not reviewed this encounter.   Rhys Martini, PA-C 03/31/20 1400    Rhys Martini, PA-C 03/31/20 1400

## 2020-05-06 ENCOUNTER — Ambulatory Visit (HOSPITAL_COMMUNITY)
Admission: EM | Admit: 2020-05-06 | Discharge: 2020-05-06 | Disposition: A | Discharge status: Home / Self Care | Payer: Medicaid Other | Attending: Student | Admitting: Student

## 2020-05-06 ENCOUNTER — Encounter (HOSPITAL_COMMUNITY): Payer: Self-pay

## 2020-05-06 ENCOUNTER — Other Ambulatory Visit: Payer: Self-pay

## 2020-05-06 DIAGNOSIS — Z113 Encounter for screening for infections with a predominantly sexual mode of transmission: Secondary | ICD-10-CM | POA: Diagnosis present

## 2020-05-06 DIAGNOSIS — R369 Urethral discharge, unspecified: Secondary | ICD-10-CM

## 2020-05-06 LAB — POCT URINALYSIS DIPSTICK, ED / UC
Bilirubin Urine: NEGATIVE
Glucose, UA: NEGATIVE mg/dL
Ketones, ur: NEGATIVE mg/dL
Leukocytes,Ua: NEGATIVE
Nitrite: NEGATIVE
Protein, ur: NEGATIVE mg/dL
Specific Gravity, Urine: 1.02 (ref 1.005–1.030)
Urobilinogen, UA: 4 mg/dL — ABNORMAL HIGH (ref 0.0–1.0)
pH: 7 (ref 5.0–8.0)

## 2020-05-06 LAB — HIV ANTIBODY (ROUTINE TESTING W REFLEX): HIV Screen 4th Generation wRfx: NONREACTIVE

## 2020-05-06 MED ORDER — CEFTRIAXONE SODIUM 500 MG IJ SOLR
INTRAMUSCULAR | Status: AC
  Filled 2020-05-06: qty 500

## 2020-05-06 MED ORDER — METRONIDAZOLE 500 MG PO TABS: 500.0000 mg | ORAL_TABLET | Freq: Two times a day (BID) | ORAL | 0 refills | Status: DC

## 2020-05-06 MED ORDER — DOXYCYCLINE HYCLATE 100 MG PO CAPS: 100.0000 mg | ORAL_CAPSULE | Freq: Two times a day (BID) | ORAL | 0 refills | Status: AC

## 2020-05-06 MED ORDER — LIDOCAINE HCL (PF) 1 % IJ SOLN
INTRAMUSCULAR | Status: AC
  Filled 2020-05-06: qty 2

## 2020-05-06 MED ORDER — CEFTRIAXONE SODIUM 500 MG IJ SOLR
500.0000 mg | Freq: Once | INTRAMUSCULAR | Status: AC
  Administered 2020-05-06: 500 mg via INTRAMUSCULAR

## 2020-05-06 NOTE — ED Provider Notes (Signed)
[facility]    CSN: [id number] Arrival date & time: 05/06/20  0830      History   Chief Complaint Chief Complaint  Patient presents with  . SEXUALLY TRANSMITTED DISEASE  . Dysuria    HPI Justin [name]. is a 30 y.o. male presenting with dysuria and yellow penile discharge.  Medical history of paranoid schizophrenia, abnormal behavior, acute psychosis, mild cognitive disorder.  Patient states that he has had dysuria for 1 day.  He is not sure if there is a rash and is requesting penile exam.  Denies other symptoms, including fever/chills, back pain, abdominal pain, penile discharge.  Requesting full STI panel.  HPI  Past Medical History:  Diagnosis Date  . Abnormal behavior   . Homelessness   . No significant past medical history   . Schizophrenia [facility])     Patient Active Problem List   Diagnosis Date Noted  . Schizophrenia, paranoid type (HCC) 07/18/2015  . Acute psychosis (HCC)   . Mild cognitive disorder 02/01/2015  . Hx of epistaxis 02/01/2015  . Vitamin B12 deficiency 01/22/2015  . 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 No significant past medical history     History reviewed. No pertinent surgical history.     Home Medications    Prior to Admission medications   Medication Sig Start Date End Date Taking? Authorizing Provider  doxycycline (VIBRAMYCIN) 100 MG capsule Take 1 capsule (100 mg total) by mouth 2 (two) times daily for 7 days. 05/06/20 05/13/20 Yes Rhys Martini, PA-C  metroNIDAZOLE (FLAGYL) 500 MG tablet Take 1 tablet (500 mg total) by mouth 2 (two) times daily. 05/06/20  Yes Rhys Martini, PA-C  benztropine (COGENTIN) 1 MG tablet Take 1 tablet (1 mg total) by mouth 2 (two) times daily. 03/25/18   Malvin Johns, MD  citalopram (CELEXA) 20 MG tablet Take 1 tablet (20 mg total) by mouth daily. 03/26/18   Malvin Johns, MD  haloperidol (HALDOL) 10 MG tablet Bid x 2 weeks then 1 at  hs 03/25/18   Malvin Johns, MD  haloperidol decanoate (HALDOL  DECANOATE) 100 MG/ML injection Inject 1.5 mLs (150 mg total) into the muscle every 28 (twenty-eight) days. 04/16/18   Malvin Johns, MD  vitamin B-12 1000 MCG tablet Take 1 tablet (1,000 mcg total) by mouth daily. For low B-12 replacement 07/27/15   Sanjuana Kava, NP    Family History Family History  Problem Relation Age of Onset  . Depression Father   . Bipolar disorder Father   . Alcohol abuse Father     Social History Social History   Tobacco Use  . Smoking status: Never Smoker  . Smokeless tobacco: Never Used  Substance Use Topics  . Alcohol use: No  . Drug use: No     Allergies   Patient has no known allergies.   Review of Systems Review of Systems  Constitutional: Negative for chills and fever.  HENT: Negative for sore throat.   Eyes: Negative for pain and redness.  Respiratory: Negative for shortness of breath.   Cardiovascular: Negative for chest pain.  Gastrointestinal: Negative for abdominal pain, diarrhea, nausea and vomiting.  Genitourinary: Positive for dysuria and penile discharge. Negative for decreased urine volume, difficulty urinating, enuresis, flank pain, frequency, genital sores, hematuria, penile pain, penile swelling, scrotal swelling, testicular pain and urgency.  Musculoskeletal: Negative for back pain.  Skin: Negative for rash.  All other systems reviewed and are negative.    Physical Exam Triage Vital Signs  ED Triage Vitals  Enc Vitals Group     BP 05/06/20 0858 126/79     Pulse Rate 05/06/20 0858 88     Resp 05/06/20 0858 17     Temp 05/06/20 0858 (!) 97.5 F (36.4 C)     Temp Source 05/06/20 0858 Oral     SpO2 05/06/20 0858 98 %     Weight --      Height --      Head Circumference --      Peak Flow --      Pain Score 05/06/20 0857 10     Pain Loc --      Pain Edu? --      Excl. in GC? --    No data found.  Updated Vital Signs BP 126/79 (BP Location: Right Arm)   Pulse 88   Temp (!) 97.5 F (36.4 C) (Oral)   Resp 17    SpO2 98%   Visual Acuity Right Eye Distance:   Left Eye Distance:   Bilateral Distance:    Right Eye Near:   Left Eye Near:    Bilateral Near:     Physical Exam Vitals reviewed. Exam conducted with a chaperone present.  Constitutional:      General: He is not in acute distress.    Appearance: Normal appearance. He is not ill-appearing.  HENT:     Head: Normocephalic and atraumatic.     Mouth/Throat:     Mouth: Mucous membranes are moist.     Comments: Moist mucous membranes Eyes:     Extraocular Movements: Extraocular movements intact.     Pupils: Pupils are equal, round, and reactive to light.  Cardiovascular:     Rate and Rhythm: Normal rate and regular rhythm.     Heart sounds: Normal heart sounds.  Pulmonary:     Effort: Pulmonary effort is normal.     Breath sounds: Normal breath sounds. No wheezing, rhonchi or rales.  Abdominal:     General: Bowel sounds are normal. There is no distension.     Palpations: Abdomen is soft. There is no mass.     Tenderness: There is no abdominal tenderness. There is no right CVA tenderness, left CVA tenderness, guarding or rebound.  Genitourinary:    Penis: Circumcised. Discharge present. No phimosis, paraphimosis, hypospadias, erythema, tenderness, swelling or lesions.      Testes: Normal.     Comments: Copious yellow discharge from penis No rash or lesion Skin:    General: Skin is warm.     Capillary Refill: Capillary refill takes less than 2 seconds.     Comments: Good skin turgor  Neurological:     General: No focal deficit present.     Mental Status: He is alert and oriented to person, place, and time.  Psychiatric:        Mood and Affect: Mood normal.        Behavior: Behavior normal.      UC Treatments / Results  Labs (all labs ordered are listed, but only abnormal results are displayed) Labs Reviewed  POCT URINALYSIS DIPSTICK, ED / UC - Abnormal; Notable for the following components:      Result Value   Hgb urine  dipstick TRACE (*)    Urobilinogen, UA 4.0 (*)    All other components within normal limits  HIV ANTIBODY (ROUTINE TESTING W REFLEX)  RPR  CYTOLOGY, (ORAL, ANAL, URETHRAL) ANCILLARY ONLY    EKG   Radiology No results found.  Procedures Procedures (including critical care time)  Medications Ordered in UC Medications  cefTRIAXone (ROCEPHIN) injection 500 mg (has no administration in time range)    Initial Impression / Assessment and Plan / UC Course  I have reviewed the triage vital signs and the nursing notes.  Pertinent labs & imaging results that were available during my care of the patient were reviewed by me and considered in my medical decision making (see chart for details).     This patient is a 30 year old male presenting with penile discharge. Today this pt is afebrile nontachycardic nontachypneic, oxygenating well on room air, no wheezes rhonchi or rales. No abd pain or CVAT.   Will send for G/C, trich, yeast, BV, HIV, RPR. Abstain until treatment completion.   Plan to treat empirically with Rocephin, doxycycline, Flagyl. ED return precautions discussed.  This chart was dictated using voice recognition software, Dragon.    Final Clinical Impressions(s) / UC Diagnoses   Final diagnoses:  Penile discharge  Routine screening for STI (sexually transmitted infection)     Discharge Instructions     -We already treated you for gonorrhea today with a shot of an antibiotic called Rocephin. -I sent an antibiotic called doxycycline which is the treatment for chlamydia, take this twice daily for 7 days. -I also sent Flagyl (metronidazole), which is the treatment for trichomonas.  Take this twice daily for 7 days.  Make sure to avoid alcohol while on this and for 2 days after, as it will cause severe nausea. -Seek additional medical attention if you develop new symptoms despite treatment, abdominal pain, back pain, fever/chills.    ED Prescriptions    Medication  Sig Dispense Auth. Provider   doxycycline (VIBRAMYCIN) 100 MG capsule Take 1 capsule (100 mg total) by mouth 2 (two) times daily for 7 days. 14 capsule Rhys Martini, PA-C   metroNIDAZOLE (FLAGYL) 500 MG tablet Take 1 tablet (500 mg total) by mouth 2 (two) times daily. 14 tablet Rhys Martini, PA-C     PDMP not reviewed this encounter.   Rhys Martini, PA-C 05/06/20 [phone]

## 2020-05-06 NOTE — Discharge Instructions (Addendum)
-  We already treated you for gonorrhea today with a shot of an antibiotic called Rocephin. -I sent an antibiotic called doxycycline which is the treatment for chlamydia, take this twice daily for 7 days. -I also sent Flagyl (metronidazole), which is the treatment for trichomonas.  Take this twice daily for 7 days.  Make sure to avoid alcohol while on this and for 2 days after, as it will cause severe nausea. -Seek additional medical attention if you develop new symptoms despite treatment, abdominal pain, back pain, fever/chills.

## 2020-05-06 NOTE — ED Triage Notes (Signed)
Pt c/o burning sensation on penis x 1 day. Pt states he wants to be tested for HIV and STD.

## 2020-05-07 LAB — RPR: RPR Ser Ql: NONREACTIVE

## 2020-05-11 LAB — CYTOLOGY, (ORAL, ANAL, URETHRAL) ANCILLARY ONLY
Chlamydia: NEGATIVE
Comment: NEGATIVE
Comment: NEGATIVE
Comment: NORMAL
Neisseria Gonorrhea: POSITIVE — AB
Trichomonas: NEGATIVE

## 2020-08-20 ENCOUNTER — Encounter (HOSPITAL_COMMUNITY): Payer: Self-pay

## 2020-08-20 ENCOUNTER — Ambulatory Visit (HOSPITAL_COMMUNITY)
Admission: EM | Admit: 2020-08-20 | Discharge: 2020-08-20 | Disposition: A | Discharge status: Home / Self Care | Payer: Medicaid Other | Attending: Urgent Care | Admitting: Urgent Care

## 2020-08-20 ENCOUNTER — Other Ambulatory Visit: Payer: Self-pay

## 2020-08-20 DIAGNOSIS — R369 Urethral discharge, unspecified: Secondary | ICD-10-CM | POA: Insufficient documentation

## 2020-08-20 DIAGNOSIS — R21 Rash and other nonspecific skin eruption: Secondary | ICD-10-CM | POA: Diagnosis not present

## 2020-08-20 DIAGNOSIS — Z8619 Personal history of other infectious and parasitic diseases: Secondary | ICD-10-CM | POA: Insufficient documentation

## 2020-08-20 MED ORDER — HYDROCORTISONE 1 % EX CREA: TOPICAL_CREAM | CUTANEOUS | 0 refills | Status: DC

## 2020-08-20 NOTE — ED Provider Notes (Signed)
[facility]   MRN: [id number] DOB: 25-Sep-1990  Subjective:   Desma Maxim Clevon Khader. is a 30 y.o. male presenting for 2-week history of acute onset persistent diffuse rash over the penis.  Patient is having unprotected sex with 1 new male partner.  Patient did test positive for gonorrhea 05/06/2020.  He tested negative for HIV and syphilis at the time.  Apart from the appearance, the rash does not bother the patient.  Denies dysuria, hematuria, urinary frequency, penile discharge, penile swelling, testicular pain, testicular swelling, anal pain, groin pain.   No current facility-administered medications for this encounter.  Current Outpatient Medications:    benztropine (COGENTIN) 1 MG tablet, Take 1 tablet (1 mg total) by mouth 2 (two) times daily., Disp: 60 tablet, Rfl: 1   citalopram (CELEXA) 20 MG tablet, Take 1 tablet (20 mg total) by mouth daily., Disp: 90 tablet, Rfl: 1   haloperidol (HALDOL) 10 MG tablet, Bid x 2 weeks then 1 at  hs, Disp: 44 tablet, Rfl: 1   haloperidol decanoate (HALDOL DECANOATE) 100 MG/ML injection, Inject 1.5 mLs (150 mg total) into the muscle every 28 (twenty-eight) days., Disp: 1 mL, Rfl: 11   metroNIDAZOLE (FLAGYL) 500 MG tablet, Take 1 tablet (500 mg total) by mouth 2 (two) times daily., Disp: 14 tablet, Rfl: 0   vitamin B-12 1000 MCG tablet, Take 1 tablet (1,000 mcg total) by mouth daily. For low B-12 replacement, Disp: 30 tablet, Rfl: 0   No Known Allergies  Past Medical History:  Diagnosis Date   Abnormal behavior    Homelessness    No significant past medical history    Schizophrenia (HCC)      No past surgical history on file.  Family History  Problem Relation Age of Onset   Depression Father    Bipolar disorder Father    Alcohol abuse Father     Social History   Tobacco Use   Smoking status: Never   Smokeless tobacco: Never  Substance Use Topics   Alcohol use: No   Drug use: No    ROS   Objective:    Vitals: BP 122/75 (BP Location: Right Arm)   Pulse 92   Temp 98.7 F (37.1 C) (Oral)   Resp 18   SpO2 97%   Physical Exam Constitutional:      General: He is not in acute distress.    Appearance: Normal appearance. He is well-developed and normal weight. He is not ill-appearing, toxic-appearing or diaphoretic.  HENT:     Head: Normocephalic and atraumatic.     Right Ear: External ear normal.     Left Ear: External ear normal.     Nose: Nose normal.     Mouth/Throat:     Pharynx: Oropharynx is clear.  Eyes:     General: No scleral icterus.       Right eye: No discharge.        Left eye: No discharge.     Extraocular Movements: Extraocular movements intact.     Pupils: Pupils are equal, round, and reactive to light.  Cardiovascular:     Rate and Rhythm: Normal rate.  Pulmonary:     Effort: Pulmonary effort is normal.  Genitourinary:    Penis: Circumcised. Discharge and lesions present. No phimosis, paraphimosis, hypospadias, erythema, tenderness or swelling.     Musculoskeletal:     Cervical back: Normal range of motion.  Skin:    General: Skin is warm and dry.  Neurological:     Mental Status: He is alert and oriented to person, place, and time.  Psychiatric:        Mood and Affect: Mood normal.        Behavior: Behavior normal.        Thought Content: Thought content normal.        Judgment: Judgment normal.      Assessment and Plan :   PDMP not reviewed this encounter.  1. Rash of penis   2. Penile discharge   3. History of gonorrhea     Discussed with patient possibility of genital warts versus contact dermatitis secondary to having sex and is suspicion of poor hygiene from his sex partner.  Recommended that he start hydrocortisone cream twice daily for a week to address the latter.  We will hold off on prescribing medications for genital warts until he can be further evaluated and confirmed to have this through a dermatologist.  Information provided to  the patient for this.  STI testing pending for physical exam findings of penile discharge. Counseled patient on potential for adverse effects with medications prescribed/recommended today, ER and return-to-clinic precautions discussed, patient verbalized understanding.    Wallis Bamberg, [place] 08/20/20 1219

## 2020-08-20 NOTE — ED Triage Notes (Signed)
Pt states he has bumps on penis. Denies itching and irritation Started: 2 weeks ago  Interventions: none

## 2020-08-22 LAB — CYTOLOGY, (ORAL, ANAL, URETHRAL) ANCILLARY ONLY
Chlamydia: NEGATIVE
Comment: NEGATIVE
Comment: NEGATIVE
Comment: NORMAL
Neisseria Gonorrhea: NEGATIVE
Trichomonas: NEGATIVE

## 2020-09-27 ENCOUNTER — Emergency Department (HOSPITAL_COMMUNITY)
Admission: EM | Admit: 2020-09-27 | Discharge: 2020-10-03 | Disposition: A | Discharge status: Home / Self Care | Payer: Medicaid Other | Attending: Student | Admitting: Student

## 2020-09-27 ENCOUNTER — Ambulatory Visit (HOSPITAL_COMMUNITY)
Admission: RE | Admit: 2020-09-27 | Discharge: 2020-09-27 | Disposition: A | Discharge status: Home / Self Care | Payer: Medicaid Other | Attending: Psychiatry | Admitting: Psychiatry

## 2020-09-27 ENCOUNTER — Other Ambulatory Visit: Payer: Self-pay

## 2020-09-27 DIAGNOSIS — F29 Unspecified psychosis not due to a substance or known physiological condition: Secondary | ICD-10-CM

## 2020-09-27 DIAGNOSIS — F209 Schizophrenia, unspecified: Secondary | ICD-10-CM | POA: Diagnosis not present

## 2020-09-27 DIAGNOSIS — Z79899 Other long term (current) drug therapy: Secondary | ICD-10-CM | POA: Insufficient documentation

## 2020-09-27 DIAGNOSIS — Z20822 Contact with and (suspected) exposure to covid-19: Secondary | ICD-10-CM | POA: Diagnosis not present

## 2020-09-27 DIAGNOSIS — Z1339 Encounter for screening examination for other mental health and behavioral disorders: Secondary | ICD-10-CM | POA: Insufficient documentation

## 2020-09-27 LAB — CBC WITH DIFFERENTIAL/PLATELET
Abs Immature Granulocytes: 0.03 10*3/uL (ref 0.00–0.07)
Basophils Absolute: 0.1 10*3/uL (ref 0.0–0.1)
Basophils Relative: 1 %
Eosinophils Absolute: 0.1 10*3/uL (ref 0.0–0.5)
Eosinophils Relative: 2 %
HCT: 45.9 % (ref 39.0–52.0)
Hemoglobin: 16.3 g/dL (ref 13.0–17.0)
Immature Granulocytes: 0 %
Lymphocytes Relative: 26 %
Lymphs Abs: 2.1 10*3/uL (ref 0.7–4.0)
MCH: 29.9 pg (ref 26.0–34.0)
MCHC: 35.5 g/dL (ref 30.0–36.0)
MCV: 84.1 fL (ref 80.0–100.0)
Monocytes Absolute: 0.5 10*3/uL (ref 0.1–1.0)
Monocytes Relative: 6 %
Neutro Abs: 5.4 10*3/uL (ref 1.7–7.7)
Neutrophils Relative %: 65 %
Platelets: 252 10*3/uL (ref 150–400)
RBC: 5.46 MIL/uL (ref 4.22–5.81)
RDW: 12.6 % (ref 11.5–15.5)
WBC: 8.2 10*3/uL (ref 4.0–10.5)
nRBC: 0 % (ref 0.0–0.2)

## 2020-09-27 LAB — COMPREHENSIVE METABOLIC PANEL
ALT: 17 U/L (ref 0–44)
AST: 16 U/L (ref 15–41)
Albumin: 4.7 g/dL (ref 3.5–5.0)
Alkaline Phosphatase: 65 U/L (ref 38–126)
Anion gap: 14 (ref 5–15)
BUN: 8 mg/dL (ref 6–20)
CO2: 23 mmol/L (ref 22–32)
Calcium: 10.3 mg/dL (ref 8.9–10.3)
Chloride: 107 mmol/L (ref 98–111)
Creatinine, Ser: 1.14 mg/dL (ref 0.61–1.24)
GFR, Estimated: >60 mL/min (ref >60)
Glucose, Bld: 104 mg/dL — ABNORMAL HIGH (ref 70–99)
Potassium: 3.9 mmol/L (ref 3.5–5.1)
Sodium: 144 mmol/L (ref 135–145)
Total Bilirubin: 1.2 mg/dL (ref 0.3–1.2)
Total Protein: 8.3 g/dL — ABNORMAL HIGH (ref 6.5–8.1)

## 2020-09-27 LAB — RESP PANEL BY RT-PCR (FLU A&B, COVID) ARPGX2
Influenza A by PCR: NEGATIVE
Influenza B by PCR: NEGATIVE
SARS Coronavirus 2 by RT PCR: NEGATIVE

## 2020-09-27 LAB — RAPID URINE DRUG SCREEN, HOSP PERFORMED
Amphetamines: NOT DETECTED
Barbiturates: NOT DETECTED
Benzodiazepines: NOT DETECTED
Cocaine: NOT DETECTED
Opiates: NOT DETECTED
Tetrahydrocannabinol: POSITIVE — AB

## 2020-09-27 LAB — ETHANOL: Alcohol, Ethyl (B): <10 mg/dL (ref <10)

## 2020-09-27 NOTE — BH Assessment (Signed)
Comprehensive Clinical Assessment (CCA) Note  09/27/2020 Justin [name]. [id number]  Disposition: Nira Conn, NP, recommends overnight observation for safety and stabilization with psych reassessment in the AM. Dr. Posey Rea and Carollee Herter, RN, informed of disposition.  Chief Complaint:  Chief Complaint  Patient presents with   Psychiatric Evaluation   Visit Diagnosis:  Hx of Schizophrenia  Justin, Curl., is a 30 year old male presenting to [facility] for medical clearance. Patient was seen at [facility] as a walk-in and referred to [facility] for medical clearance due to patients presentation. Per history, patient has history of schizophrenia.  During TTS assessment patient did not answer questions. Patient appeared to mumble twice. Patient unable to answer questions on TTS assessment. Patient tested positive for marijuana.   PER ABIGAIL HARRIS, PA-C, at [facility]: Justin [name]. is a 30 y.o. male who [facility].  He has a past medical head area history of schizophrenia.  The patient is currently noncommunicative, overtly psychotic and responding to internal stimuli.  He is unable to provide any history.   PER Dorena Bodo, NP, at [facility]. Justin [name]. is a 30 y.o. male is seen by this provider with TTS counselor as voluntary walk-in at [facility] accompanied by his mother, mother is present during interview. History of parkinson's disease and schizophrenia. Patient is sitting in exam room blankly staring into space. Will not identify himself. His mother reports that patient has been living on his own and she learned that he has not been taking his medications for 3 months. Patient is usually managed by [facility]. It is believed that patient has been using marijuana and drinking beer with unknown amounts or last consumption.  The only words patient spoke were "I didn't have beer today" Mother reports that patient did not know how old he is  when filling out registration paper to be seen today. Psychiatric evaluation will not be completed until medical clearance is obtained. Unable to assess this patient due to possible medical condition.  CCA Biopsychosocial Patient Reported Schizophrenia/Schizoaffective Diagnosis in Past: No data recorded  Strengths: uta  Mental Health Symptoms Depression:   -- Rich Reining)   Duration of Depressive symptoms:    Mania:   -- Rich Reining)   Anxiety:    -- Rich Reining)   Psychosis:   -- Rich Reining)   Duration of Psychotic symptoms:    Trauma:   -- Rich Reining)   Obsessions:   -- Rich Reining)   Compulsions:   -- Rich Reining)   Inattention:   -- Rich Reining)   Hyperactivity/Impulsivity:   -- Rich Reining)   Oppositional/Defiant Behaviors:   -- Rich Reining)   Emotional Irregularity:   -- Rich Reining)   Other Mood/Personality Symptoms:  No data recorded   Mental Status Exam Appearance and self-care  Stature:   Average   Weight:   Average weight   Clothing:   Age-appropriate   Grooming:   Normal   Cosmetic use:   None   Posture/gait:   Normal   Motor activity:   Not Remarkable   Sensorium  Attention:   Confused   Concentration:   Preoccupied   Orientation:   -- Rich Reining)   Recall/memory:   -- Rich Reining)   Affect and Mood  Affect:   -- Rich Reining)   Mood:   -- Rich Reining)   Relating  Eye contact:   Fleeting   Facial expression:   Tense   Attitude toward examiner:   Resistant   Thought and Language  Speech flow:  -- Rich Reining)   Thought content:   -- Rich Reining)   Preoccupation:   -- Rich Reining)   Hallucinations:   -- Rich Reining)   Organization:  No data recorded  [employer] of Knowledge:   -- Rich Reining)   Intelligence:  No data recorded  Abstraction:   -- Rich Reining)   Judgement:   Impaired   Reality Testing:   -- Rich Reining)   Insight:   -- Rich Reining)   Decision Making:   -- Rich Reining)   Social Functioning  Social Maturity:   -- Industrial/product designer)   Social Judgement:   -- Rich Reining)   Stress  Stressors:   -- Industrial/product designer)   Coping Ability:    -- Rich Reining)   Skill Deficits:   -- Rich Reining)   Supports:   -- Rich Reining)    Religion: Religion/Spirituality Are You A Religious Person?:  Industrial/product designer)  Leisure/Recreation: Leisure / Recreation Do You Have Hobbies?:  Rich Reining)  Exercise/Diet: Exercise/Diet Do You Exercise?:  (uta) Have You Gained or Lost A Significant Amount of Weight in the Past Six Months?:  (uta) Do You Follow a Special Diet?:  (uta) Do You Have Any Trouble Sleeping?:  (uta)  CCA Employment/Education Employment/Work Situation: Employment / Work Situation Employment Situation:  Industrial/product designer) Patient's Job has Been Impacted by Current Illness:  (uta) Has Patient ever Been in the [employer]?:  Industrial/product designer)  Education: Education Is Patient Currently Attending School?:  (uta) Last Grade Completed:  Rich Reining) Did You Attend College?:  (uta) Did You Have An Individualized Education Program (IIEP):  Rich Reining) Did You Have Any Difficulty At School?:  Rich Reining) Patient's Education Has Been Impacted by Current Illness:  (uta)  CCA Family/Childhood History Family and Relationship History:   Childhood History:  Childhood History By whom was/is the patient raised?: Mother Did patient suffer any verbal/emotional/physical/sexual abuse as a child?: No Did patient suffer from severe childhood neglect?: No Has patient ever been sexually abused/assaulted/raped as an adolescent or adult?: No Was the patient ever a victim of a crime or a disaster?: No Witnessed domestic violence?: No  Child/Adolescent Assessment:   CCA Substance Use Alcohol/Drug Use: Alcohol / Drug Use Pain Medications: see MAR Prescriptions: see MAR Over the Counter: see MAR History of alcohol / drug use?: No history of alcohol / drug abuse   ASAM's:  Six Dimensions of Multidimensional Assessment  Dimension 1:  Acute Intoxication and/or Withdrawal Potential:      Dimension 2:  Biomedical Conditions and Complications:      Dimension 3:  Emotional, Behavioral, or Cognitive Conditions  and Complications:     Dimension 4:  Readiness to Change:     Dimension 5:  Relapse, Continued use, or Continued Problem Potential:     Dimension 6:  Recovery/Living Environment:     ASAM Severity Score:    ASAM Recommended Level of Treatment:     Substance use Disorder (SUD)   Recommendations for Services/Supports/Treatments:   Discharge Disposition:   DSM5 Diagnoses: Patient Active Problem List   Diagnosis Date Noted   Schizophrenia, paranoid type (HCC) 07/18/2015   Acute psychosis (HCC)    Mild cognitive disorder 02/01/2015   Hx of epistaxis 02/01/2015   Vitamin B12 deficiency 01/22/2015   TSH deficiency 01/22/2015   No significant past medical history    Referrals to Alternative Service(s): Referred to Alternative Service(s):   Place:   Date:   Time:    Referred to Alternative Service(s):   Place:   Date:   Time:    Referred  to Alternative Service(s):   Place:   Date:   Time:    Referred to Alternative Service(s):   Place:   Date:   Time:     Venora Maples, [facility]

## 2020-09-27 NOTE — BH Assessment (Signed)
TTS clinician attempted to complete TTS assessment. Patient is noncommunicating and responding to internal stimuli through shrugging shoulders and raising eyebrows.  Patient is unable to provide any history. TTS clinician requested collateral information, none given. TTS will attempt at later time when patient is oriented and able to participate in assessment. Dr. Posey Rea and Carollee Herter, RN, were given update.

## 2020-09-27 NOTE — BH Assessment (Signed)
Justin [name] presented as a walk in to [facility] with his mother for a psychiatric evaluation. Patient was seen by Dorena Bodo, NP, who determined that patient needs medical clearance. Patient has a history of Parkinson's disease, schizophrenia and substance use.  Patient refuses to answer any questions during attempted assessment and only reports that he did not drink today. Once medically cleared, TTS will complete full assessment.

## 2020-09-27 NOTE — ED Triage Notes (Signed)
Pt presents to ED cc psych eval. Pt ambulatory with steady gait. Respirations equal, unlabored. This RN attempted to interview pt for assessment and triage. Pt didn't answer questions ask, only repeatedly saying "done knocked his ass out." Pt follows basic commands.

## 2020-09-27 NOTE — ED Notes (Addendum)
Patient being transferred to [facility]. While being brought back to TCU patient threw his shoes/socks at staff member. Patient also removed his pulse ox off finger and threw it on the ground. Pt purposely touching staff while walking by them. Patient is now in room 27 having a conversation with himself. TTS machine placed in room for consult

## 2020-09-27 NOTE — H&P (Addendum)
Behavioral Health Medical Screening Exam  Davinder Haff Ikechukwu Cerny. is a 30 y.o. male is seen by this provider with TTS counselor as voluntary walk-in at [facility] accompanied by his mother, mother is present during interview. History of parkinson's disease and schizophrenia. Patient is sitting in exam room blankly staring into space. Will not identify himself. His mother reports that patient has been living on his own and she learned that he has not been taking his medications for 3 months. Patient is usually managed by [facility].    It is believed that patient has been using marijuana and drinking beer with unknown amounts or last consumption.  The only words patient spoke were "I didn't have beer today"   Mother reports that patient did not know how old he is when filling out registration paper to be seen today.   Psychiatric evaluation will not be completed until medical clearance is obtained. Unable to assess this patient due to possible medical condition.   Total Time spent with patient: 20 minutes  Psychiatric Specialty Exam:  Presentation  General Appearance:  Casual Eye Contact: None Speech: Other (comment) (would not respond to questions) Speech Volume: No data recorded Handedness: No data recorded  Mood and Affect  Mood: No data recorded Affect: Blunt  Thought Process  Thought Processes: No data recorded Descriptions of Associations:No data recorded Orientation:-- (unable to assess) Thought Content:No data recorded History of Schizophrenia/Schizoaffective disorder:No data recorded Duration of Psychotic Symptoms:No data recorded Hallucinations:Hallucinations: -- (unable to assess) Ideas of Reference:No data recorded Suicidal Thoughts:Suicidal Thoughts: -- (unable to assess) Homicidal Thoughts:No data recorded  Sensorium  Memory: No data recorded Judgment: No data recorded Insight: No data recorded  Executive Functions  Concentration: No data  recorded Attention Span: No data recorded Recall: No data recorded Fund of Knowledge: No data recorded Language: No data recorded  Psychomotor Activity  Psychomotor Activity: No data recorded  Assets  Assets: No data recorded  Sleep  Sleep: No data recorded   Physical Exam: Physical Exam Vitals reviewed.  Constitutional:      General: He is not in acute distress. Cardiovascular:     Rate and Rhythm: Regular rhythm.  Pulmonary:     Effort: Pulmonary effort is normal.  Skin:    General: Skin is warm and dry.  Neurological:     Mental Status: He is alert. He is disoriented.  Psychiatric:        Attention and Perception: He is inattentive.        Mood and Affect: Affect is blunt.        Speech: He is noncommunicative.     Comments: Unable to complete full assessment.    Review of Systems  Constitutional:  Negative for chills and fever.  Respiratory:  Negative for shortness of breath.        No obvious shortness of breath.   Psychiatric/Behavioral:         Unable to assess  Blood pressure (!) 137/93, pulse 100, temperature 99.1 F (37.3 C), temperature source Oral, resp. rate 18, SpO2 100 %. There is no height or weight on file to calculate BMI.  Musculoskeletal: Strength & Muscle Tone: within normal limits and decreased Gait & Station: normal Patient leans: N/A   Recommendations:  Based on my evaluation the patient appears to have an emergency medical condition for which I recommend the patient be transferred to the emergency department for further evaluation. Will send patient to [facility] ED for medical clearance. Once medically cleared, place patient  on TTS list for full psychiatric evaluation. Mother will contact [facility] for medication list tomorrow once they reopen. Safe transport notified. Consulting civil engineer notified. Dr. Posey Rea, EDP notified via secure chat that patient will need medical clearance. EMTALA form completed.  Mother agrees with plan to send to [facility] for  medical clearance.   Novella Olive, NP 09/27/2020, 5:09 PM

## 2020-09-27 NOTE — ED Provider Notes (Signed)
[facility]-EMERGENCY DEPT Provider Note   CSN: [id number] Arrival date & time: 09/27/20  1723     History Chief Complaint  Patient presents with   Psychiatric Evaluation    Justin [name]. is a 30 y.o. male who [facility].  He has a past medical head area history of schizophrenia.  The patient is currently noncommunicative, overtly psychotic and responding to internal stimuli.  He is unable to provide any history.   Per [facility] provider note :"His mother reports that patient has been living on his own and she learned that he has not been taking his medications for 3 months. Patient is usually managed by [facility].     It is believed that patient has been using marijuana and drinking beer with unknown amounts or last consumption.  The only words patient spoke were "I didn't have beer today"    Mother reports that patient did not know how old he is when filling out registration paper to be seen today."    HPI     Past Medical History:  Diagnosis Date   Abnormal behavior    Homelessness    No significant past medical history    Schizophrenia [facility])     Patient Active Problem List   Diagnosis Date Noted   Schizophrenia, paranoid type (HCC) 07/18/2015   Acute psychosis (HCC)    Mild cognitive disorder 02/01/2015   Hx of epistaxis 02/01/2015   Vitamin B12 deficiency 01/22/2015   TSH deficiency 01/22/2015   No significant past medical history     No past surgical history on file.     Family History  Problem Relation Age of Onset   Depression Father    Bipolar disorder Father    Alcohol abuse Father     Social History   Tobacco Use   Smoking status: Never   Smokeless tobacco: Never  Vaping Use   Vaping Use: Never used  Substance Use Topics   Alcohol use: No   Drug use: No    Home Medications Prior to Admission medications   Medication Sig Start Date End Date Taking?  Authorizing Provider  benztropine (COGENTIN) 1 MG tablet Take 1 tablet (1 mg total) by mouth 2 (two) times daily. 03/25/18   Malvin Johns, MD  citalopram (CELEXA) 20 MG tablet Take 1 tablet (20 mg total) by mouth daily. 03/26/18   Malvin Johns, MD  haloperidol (HALDOL) 10 MG tablet Bid x 2 weeks then 1 at  hs 03/25/18   Malvin Johns, MD  haloperidol decanoate (HALDOL DECANOATE) 100 MG/ML injection Inject 1.5 mLs (150 mg total) into the muscle every 28 (twenty-eight) days. 04/16/18   Malvin Johns, MD  hydrocortisone cream 1 % Apply to affected area 2 times daily 08/20/20   Wallis Bamberg, PA-C  metroNIDAZOLE (FLAGYL) 500 MG tablet Take 1 tablet (500 mg total) by mouth 2 (two) times daily. 05/06/20   Rhys Martini, PA-C  vitamin B-12 1000 MCG tablet Take 1 tablet (1,000 mcg total) by mouth daily. For low B-12 replacement 07/27/15   Sanjuana Kava, NP    Allergies    Patient has no known allergies.  Review of Systems   Review of Systems Ten systems reviewed and are negative for acute change, except as noted in the HPI.   Physical Exam Updated Vital Signs BP (!) 135/91   Pulse (!) 107   Temp 98.6 F (37 C) (Oral)   Resp 16  Ht 5\' 8"  (1.727 m)   Wt 69 kg   SpO2 100%   BMI 23.13 kg/m   Physical Exam Vitals and nursing note reviewed.  Constitutional:      General: He is not in acute distress.    Appearance: He is well-developed. He is not diaphoretic.  HENT:     Head: Normocephalic and atraumatic.  Eyes:     General: No scleral icterus.    Conjunctiva/sclera: Conjunctivae normal.  Cardiovascular:     Rate and Rhythm: Normal rate and regular rhythm.     Heart sounds: Normal heart sounds.  Pulmonary:     Effort: Pulmonary effort is normal. No respiratory distress.     Breath sounds: Normal breath sounds.  Abdominal:     Palpations: Abdomen is soft.     Tenderness: There is no abdominal tenderness.  Musculoskeletal:     Cervical back: Normal range of motion and neck supple.  Skin:     General: Skin is warm and dry.  Neurological:     Mental Status: He is alert.  Psychiatric:        Attention and Perception: He is inattentive.        Mood and Affect: Affect is flat.        Speech: He is noncommunicative.        Behavior: Behavior is cooperative.     Comments: Responding to internal stimuli Eyes darting Will not make eye contact Non-communicative with provider Cooperative with blood draw.    ED Results / Procedures / Treatments   Labs (all labs ordered are listed, but only abnormal results are displayed) Labs Reviewed  RESP PANEL BY RT-PCR (FLU A&B, COVID) ARPGX2  CBC WITH DIFFERENTIAL/PLATELET  COMPREHENSIVE METABOLIC PANEL  ETHANOL  RAPID URINE DRUG SCREEN, HOSP PERFORMED    EKG None  Radiology No results found.  Procedures Procedures   Medications Ordered in ED Medications - No data to display  ED Course  I have reviewed the triage vital signs and the nursing notes.  Pertinent labs & imaging results that were available during my care of the patient were reviewed by me and considered in my medical decision making (see chart for details).    MDM Rules/Calculators/A&P                           Medically clear Final Clinical Impression(s) / ED Diagnoses Final diagnoses:  None    Rx / DC Orders ED Discharge Orders     None        , PA-C 09/27/20 2152    2153, MD 09/28/20 0030

## 2020-09-28 MED ORDER — METRONIDAZOLE 500 MG PO TABS: 500.0000 mg | ORAL_TABLET | Freq: Two times a day (BID) | ORAL | Status: DC

## 2020-09-28 MED ORDER — PALIPERIDONE PALMITATE ER 39 MG/0.25ML IM SUSY
117.0000 mg | PREFILLED_SYRINGE | Freq: Once | INTRAMUSCULAR | Status: AC
  Administered 2020-09-29: 117 mg via INTRAMUSCULAR
  Filled 2020-09-28: qty 117

## 2020-09-28 MED ORDER — OLANZAPINE 10 MG PO TBDP: 10.0000 mg | ORAL_TABLET | Freq: Every day | ORAL | Status: DC

## 2020-09-28 MED ORDER — HALOPERIDOL DECANOATE 100 MG/ML IM SOLN
150.0000 mg | INTRAMUSCULAR | Status: DC
  Filled 2020-09-28: qty 1.5

## 2020-09-28 MED ORDER — CITALOPRAM HYDROBROMIDE 10 MG PO TABS
20.0000 mg | ORAL_TABLET | Freq: Every day | ORAL | Status: DC
  Administered 2020-09-29 – 2020-10-03 (×3): 20 mg via ORAL
  Filled 2020-09-28 (×5): qty 2

## 2020-09-28 MED ORDER — BENZTROPINE MESYLATE 1 MG PO TABS
1.0000 mg | ORAL_TABLET | Freq: Two times a day (BID) | ORAL | Status: DC
  Administered 2020-09-29 – 2020-10-03 (×4): 1 mg via ORAL
  Filled 2020-09-28 (×7): qty 1

## 2020-09-28 NOTE — ED Notes (Signed)
Called patient's mother to get updated medication list. Mother states she gave med list to nurse last night. I am unable to find this list. Told mother to bring another list by at 3pm when she gets off work, if we find the list I will notify her.

## 2020-09-28 NOTE — ED Notes (Signed)
Patient resting on bed calm and quiet at this time

## 2020-09-28 NOTE — ED Provider Notes (Signed)
Emergency Medicine Observation Re-evaluation Note  Devaun Hernandez. is a 30 y.o. male, seen on rounds today.  Pt initially presented to the ED for complaints of Psychiatric Evaluation Currently, the patient is stable.  Physical Exam  BP (!) 138/92 (BP Location: Left Arm)   Pulse (!) 117   Temp 98.6 F (37 C) (Oral)   Resp 19   Ht 5\' 8"  (1.727 m)   Wt 69 kg   SpO2 100%   BMI 23.13 kg/m  Physical Exam General: resting Lungs: Respirations even and unlabored Psych: Resting, calm  ED Course / MDM  EKG:   I have reviewed the labs performed to date as well as medications administered while in observation.  Recent changes in the last 24 hours include TTS evaluated the patient: , NP, recommends overnight observation for safety and stabilization with psych reassessment in the AM. .  Plan  Current plan is for Re-eval by TTS this AM.  Nira Conn. is not under involuntary commitment.     Doyce Para, MD 09/28/20 [phone]

## 2020-09-28 NOTE — BH Assessment (Signed)
Disposition Note:   Per Dorena Bodo, NP, patient meets criteria for inpatient treatment. Disposition Counselor to seek placement. @2256  notified [facility] , RN) of patient's bed needs. Also, patient has been faxed out for consideration of placement at other hospitals.

## 2020-09-28 NOTE — ED Notes (Signed)
Patient continues to refuse vital signs and will not take his night medications

## 2020-09-28 NOTE — Consult Note (Addendum)
Several attempts made today by behavioral health coordinator to obtain updated and current medication list from [facility] without success.  EKG ordered to monitor QTc Zyprexa 10 mg disintegrating tablet PO x 1 dose.   Will adjust medications once med reconciliation is complete.

## 2020-09-28 NOTE — ED Notes (Signed)
Patient mother called and states she will fax medication list over to update list. Fax number [phone] to fax here in [facility] unit.

## 2020-09-28 NOTE — ED Notes (Signed)
Patient is refusing staff to allow them to take his vital signs

## 2020-09-28 NOTE — Consult Note (Signed)
Justin [name] is 30 y.o male seen by this provider on 09/27/20 as voluntary walk-in at [facility]. Patient has been off of his medications for 3 months, staring blankly into space and would not communicate with assessment. Patient's mother reports patient requires inpatient hospitalization for stabilization. UDS positive for THC.   Mother to obtain medication list today from [facility] while awaiting inpatient placement.  Disposition: Meets criteria for inpatient psychiatric admission for psychosis. Staff monitor for safety while awaiting placement. Will restart home medications once this information is obtained.

## 2020-09-29 NOTE — Progress Notes (Addendum)
Inpatient Behavioral Health Placement  Meets inpatient criteria per , Dorena Bodo, NP. [facility] informed of pt's needs for placement. Referral was sent to the following facilities:  Destination Service Provider Address Phone Fax  [facility]  [address] [place] [address] [phone]  [facility]  [phone] [place] [place] [address] [phone]  [facility]  [address] [place], [place] [address] [phone]  [facility]  [address] [place] [address] [phone]  [facility]  [address], [place] [address] [phone]  [facility]  [address], [place] [address] [phone]  [facility]  [address] [place] [address] [phone]  [facility]  [address] [place] [address] [phone]  [facility]  [address] [place], [place] [address] [phone]  [facility]  [address], [place] [address] [phone]   Situation ongoing,  CSW will follow up.   Maryjean Ka, MSW, LCSWA 09/29/2020  @ 12:02 AM;

## 2020-09-29 NOTE — ED Notes (Addendum)
Pt alert this shift. Calm. Medication compliant. Flat. Dull. Blunted. Limited eye contact. Guarded . Not answering questions. Pt resting in bed.

## 2020-09-29 NOTE — ED Notes (Signed)
Pt compliant with VS.

## 2020-09-29 NOTE — ED Provider Notes (Signed)
Emergency Medicine Observation Re-evaluation Note  Justin [name]. is a 30 y.o. male, seen on rounds today.  Pt initially presented to the ED for complaints of Psychiatric Evaluation Currently, the patient is resting.  Physical Exam  BP (!) 138/92 (BP Location: Left Arm)   Pulse (!) 117   Temp 98.6 F (37 C) (Oral)   Resp 19   Ht 5\' 8"  (1.727 m)   Wt 69 kg   SpO2 100%   BMI 23.13 kg/m  Physical Exam General: well appearing  Psych: stable  ED Course / MDM  EKG:   I have reviewed the labs performed to date as well as medications administered while in observation.  Recent changes in the last 24 hours include nothing.  Plan  Current plan is for appears to be awaiting placement.  [name] is not under involuntary commitment.     Justin Lager, DO 09/29/20 0830

## 2020-09-29 NOTE — ED Notes (Signed)
Patient continues to refuse vital signs to be taken

## 2020-09-29 NOTE — Consult Note (Signed)
jakie debow is 30 y.o male seen face to face by this provider at [facility]. Patient lying on bed, staring away from provider and speaking to himself. Would not speak to provider or participate in interview.  Continue to meet criteria for inpatient psychiatric admission secondary acute psychosis. Medication list obtained , EDP restarted medications per list.   Staff monitor for safety while awaiting inpatient bed placement.

## 2020-09-29 NOTE — Progress Notes (Signed)
CSW inquired about potential bed offer at [facility]. CSW advised that there are no appropriate beds at [facility] per [facility], Fransico Michael, RN. Referral has been faxed out. CSW will assist and follow for potential bed offers. Pt meets inpatient behavioral health placement per Dorena Bodo, NP.   Maryjean Ka, MSW, [facility] 09/29/2020 11:36 PM

## 2020-09-30 ENCOUNTER — Other Ambulatory Visit: Payer: Self-pay

## 2020-09-30 NOTE — ED Notes (Addendum)
Pt asked would he take morning medications. Pt would not answer question. Pt asked once again about medications and pt stared at TV. MD made aware.

## 2020-09-30 NOTE — BH Assessment (Signed)
[name]   Per Maxie Barb, NP this voluntary pt requires psychiatric hospitalization at this time.  Pt is currently under consideration for admission at [facility].  Final disposition is pending as of this writing.  Doylene Canning, [place] Behavioral Health Coordinator [phone]

## 2020-09-30 NOTE — ED Notes (Signed)
Pt currently cooperative, pt expressed via headshake he did not need anything at the moment. Pt allowed me to collect vitals without incident.

## 2020-09-30 NOTE — ED Notes (Signed)
Pt still not communicating with staff. Pt resting on bed.

## 2020-09-30 NOTE — ED Notes (Addendum)
NT asked pt was he going to eat his breakfast on his bedside table, pt would not answer NT. Pt kicked NT hand as she was walking away. MD notified.

## 2020-09-30 NOTE — Progress Notes (Addendum)
CSW comminicated with [facility]) Fransico Michael, RN to obtain an obtain about review. Pt is still under review at [facility]. At this time there are no appropriate beds to meet pt's needs at [facility] per Fransico Michael, RN [facility]). CSW sent referral out. CSW will continue to assist and follow for potential bed offers for inpatient behavioral health.     Destination Service Provider Address Phone Fax  [facility]  [address] [place] [address] [phone]  [facility]  [phone] [address] [place] [address] [phone]  [facility]  [address] [place], [place] [address] [phone]  [facility]  [address] [place] [address] [phone]  [facility]  [address] [place] [address] [phone]  [facility]  [address] [place] [address] [phone]  [facility]  [address], [place] [address] [phone]  [facility]  [address] [place] [address] [phone]  [facility]  [address] [place], [place] [address] [phone]  [facility]  [address] [place] [address] [phone]  [facility]  [address] [place] [address] [phone]  [facility]  [address] [place], [place] [address] [phone]  [facility]  [address] [place] [address] [phone]  [facility]  [address], [place] [address] [phone]  [facility]  [address], [place] [address] [phone]  [facility]  [address] [place] [address]  [phone]   Maryjean Ka, MSW, [facility] 09/30/2020 11:34 PM

## 2020-09-30 NOTE — BH Assessment (Signed)
Patient was seen this date to assess current mental health status. Patient is observed to be drowsy and will not interact with this Clinical research associate. As Clinical research associate attempts to interact patient is observed to turn over in the bed and cover his head up. Per notes patient has also been refusing to interact with other providers. Per chart review Leevy-Johnson NP recommends a continued inpatient admission as bed placement is investigated.

## 2020-09-30 NOTE — ED Provider Notes (Signed)
Emergency Medicine Observation Re-evaluation Note  Justin Rama. is a 30 y.o. male, seen on rounds today.  Pt initially presented to the ED for complaints of Psychiatric Evaluation Currently, the patient is sleeping, resting comfortably.  Patient does not want to answer questions but does make direct eye contact..  Physical Exam  BP 120/86 (BP Location: Left Arm)   Pulse 93   Temp 97.8 F (36.6 C) (Oral)   Resp 17   Ht 5\' 8"  (1.727 m)   Wt 69 kg   SpO2 100%   BMI 23.13 kg/m  Physical Exam General: Well-appearing  Psych: Not participating in conversation.  ED Course / MDM  EKG:   I have reviewed the labs performed to date as well as medications administered while in observation.  Recent changes in the last 24 hours include nothing.  Plan  Current plan is for inpatient placement, patient awaiting placement. Justin Maxim. is not under involuntary commitment.     [name], PA-C 09/30/20 11/30/20    4975, DO 09/30/20 1012

## 2020-10-01 MED ORDER — LORAZEPAM 1 MG PO TABS
1.0000 mg | ORAL_TABLET | Freq: Three times a day (TID) | ORAL | Status: AC
  Administered 2020-10-01 (×2): 1 mg via ORAL
  Filled 2020-10-01 (×3): qty 1

## 2020-10-01 NOTE — ED Notes (Signed)
Pt alert .  Guarded. Blunted. No answering questions. Medication compliant this AM.  Pt resting in bed comfortably .  Pt ate breakfast.

## 2020-10-01 NOTE — Progress Notes (Signed)
Per Tamela Oddi, patient meets criteria for inpatient treatment. There are no available or appropriate beds at [facility] today. CSW re-faxed referrals to the following facilities for review:  [facility]  Pending - Request Sent N/A [address] [place] [address] [phone] --  [facility]  Pending - Request Sent N/A [phone] [address] [place] [address] [phone] --  [facility]  Pending - Request Sent N/A [address] [place], [place] [address] [phone] --  [facility]  Pending - Request Sent N/A [address], [place] [address] [phone] --  [facility]  Pending - Request Sent N/A [address], [place] [address] [phone] --  [facility]  Pending - Request Sent N/A [address] [place] [address] [phone] --  [facility]  Pending - Request Sent N/A [address], [place] [address] [phone] --  [facility]  Pending - Request Sent N/A [address] [place] [address] [phone] --  [facility]  Pending - Request Sent N/A [address] [place], [place] [address] [phone] --  [facility]  Pending - Request Sent N/A [address] [place] [address] [phone] --  [facility]  Pending - Request Sent N/A [address], [place] [address] [phone] --  [facility]  Pending - Request Sent N/A [address] [place], [place] [address] [phone] --  [facility]  Pending - Request Sent N/A [address] [place] [address] [phone] --  [facility]  Pending - Request Sent N/A [address], [place] [address] [phone] --  [facility]  Pending - Request Sent N/A [address], [place] [address] [phone] --  [facility]  Pending - Request Sent N/A [address], [place] [address] [phone] --   TTS will continue to seek bed placement.  Crissie Reese, MSW, LCSW-A, LCAS-A Phone: [phone] Disposition/TOC

## 2020-10-01 NOTE — ED Notes (Signed)
Breakfast tray given. °

## 2020-10-01 NOTE — BH Assessment (Signed)
Patient was seen this date to evaluate current mental health status. Patient will not engage with this Clinical research associate and remains mute. Patient is observed to stare at the wall opposite of this Clinical research associate. This Clinical research associate is uncertain if patient is comprehending the content of this writers questions. Starkes FNP recommends a continued inpatient admission as bed placement is investigated.

## 2020-10-01 NOTE — ED Notes (Signed)
Pt alert. Pt continues to be awake, lying in bed comfortably.  Guarded . Not answering question.

## 2020-10-01 NOTE — Consult Note (Signed)
  Justin [name]. is a 30 y.o. male who was seen by the psychiatric provider, while in the psychiatric holding ED.  Patient has a history of schizophrenia and Parkinson's disease.  Patient as of previous attempts, continues to not engage, remains mute and does not choose to speak at this time.  Patient has been in the ED for 3 days, with no response to current medication regimen.     Patient is attempted to be seen, he is sitting in the room staring blankly into space.  He will not communicate at this time.  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  Will attempt a brief trial of Ativan 1 mg p.o. 3 times daily for 3 doses, to assess for improvement in his current symptoms and presentation.  -Patient continues to meet inpatient criteria at this time.

## 2020-10-01 NOTE — ED Provider Notes (Signed)
Emergency Medicine Observation Re-evaluation Note  Justin [name]. is a 30 y.o. male, seen on rounds today.  Pt initially presented to the ED for complaints of Psychiatric Evaluation Currently, the patient is eating breakfast, comfortable doubly resting and in no acute distress.Justin [name]  Physical Exam  BP 112/82 (BP Location: Left Arm)   Pulse (!) 110   Temp 98.4 F (36.9 C) (Oral)   Resp 18   Ht 5\' 8"  (1.727 m)   Wt 69 kg   SpO2 95%   BMI 23.13 kg/m  Physical Exam General: Well-appearing Psych: Does not engage in conversation, makes eye contact.  ED Course / MDM  EKG:   I have reviewed the labs performed to date as well as medications administered while in observation.  Recent changes in the last 24 hours include nothing.  Plan  Current plan is for same as yesterday.  Justin [name]. is not under involuntary commitment.     Justin Lager, PA-C 10/01/20 0935    12/01/20, DO 10/01/20 1842

## 2020-10-02 MED ORDER — LORAZEPAM 1 MG PO TABS
1.0000 mg | ORAL_TABLET | Freq: Three times a day (TID) | ORAL | Status: DC
  Administered 2020-10-02 – 2020-10-03 (×3): 1 mg via ORAL
  Filled 2020-10-02 (×3): qty 1

## 2020-10-02 NOTE — Consult Note (Signed)
Patient was seen and attempted to reassess. However patient continues to remain mute and verbally unresponsive. He will not communicate at this time. Patient is calm and cooperative, and not showing any signs of psychosis or paranoia.   Patient is attempted to be seen, he is sitting in the room staring blankly into space.  He will not communicate at this time.  Patient continues to present with decreased response to external stimuli, decreased movement, mutism, and blank staring.  Has continued to lay in the same bed for hours.  He does appear to be walking television, although his eyes are blinking intermittently.  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  Although patient has been refusing medication since yesterday (he allowed for 1 dose of Ativan in which she was responsive and then refuse doses thereafter).    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  Will attempt a brief trial of Ativan 1 mg p.o. 3 times daily. to assess for improvement in his current symptoms and presentation.  Will continue to encourage patient to take medication as directed, although he has voluntarily.  He currently does not present as a danger to himself or others, therefore does not quite meet criteria for IVC. -If patient continues to refuse or deny medications, alternative therapy for catatonia is ECT.  This provider has reached out to [facility] to review for possible inpatient psychiatric admission.  -Patient continues to meet inpatient criteria at this time.

## 2020-10-02 NOTE — ED Notes (Signed)
Pt's mom requests an update before he is moved

## 2020-10-02 NOTE — Progress Notes (Signed)
Per [facility] Admissions, pt has been accepted to [facility] unit pending completion of IVC for safety and transport. Accepting provider is Dr. Estill Cotta. Patient can arrive anytime after 8:00am on 10/03/2020. Number for report is [phone]. Please fax IVC paperwork once completed to [phone].   Crissie Reese, MSW, LCSW-A Phone: [phone] Disposition/TOC

## 2020-10-02 NOTE — Progress Notes (Signed)
CSW followed up with [facility] in reference to a referral sent for seeking placement. It was reported that the patient is under review.   Aditi Rovira, MSW, LCSW-A, LCAS-A Phone: [phone] Disposition/TOC 

## 2020-10-02 NOTE — ED Notes (Signed)
Pt has not spoken to staff at all today, will not answer any questions. Pt refuses meds by pushing med cup away. Pt eats meals with no issues.

## 2020-10-02 NOTE — ED Provider Notes (Signed)
Emergency Medicine Observation Re-evaluation Note  Justin [name]. is a 30 y.o. male, seen on rounds today.  Pt initially presented to the ED for complaints of Psychiatric Evaluation Currently, the patient is resting comfortably watching TV.Marland Kitchen  Physical Exam  BP 107/65 (BP Location: Left Arm)   Pulse 74   Temp 97.8 F (36.6 C) (Oral)   Resp 16   Ht 5\' 8"  (1.727 m)   Wt 69 kg   SpO2 96%   BMI 23.13 kg/m  Physical Exam General: Well-appearing Psych: Patient is still selectively mute.  ED Course / MDM  EKG:   I have reviewed the labs performed to date as well as medications administered while in observation.  Recent changes in the last 24 hours include none.  Plan  Current plan is for inpatient placement, awaiting bed.  Justin [name]. is not under involuntary commitment.     [name], PA-C 10/02/20 12/02/20    0932, DO 10/02/20 1821

## 2020-10-02 NOTE — Progress Notes (Signed)
Per Inez Catalina, patient meets criteria for inpatient treatment. There are no available or appropriate beds at [facility] today. CSW faxed referrals to the following facilities for review:  [facility]  Pending - Request Sent N/A [address], [place] [address] [phone] --  [facility]  Pending - Request Sent N/A [phone] [place] [address] [phone] --  [facility]  Pending - Request Sent N/A [address] [place], [place] [address] [phone] --  [facility]  Pending - Request Sent N/A [address], [place] [address] [phone] --  [facility]  Pending - Request Sent N/A [address] [place] [address] [phone] --  [facility]  Pending - Request Sent N/A [address] [place] [address] [phone] --  [facility]  Pending - Request Sent N/A [address], [place] [address] [phone] --  [facility]  Pending - Request Sent N/A [address], [place] [address] [phone] --  [facility]  Pending - Request Sent N/A [address] [place], [place] [address] [phone] --  [facility]  Pending - Request Sent N/A [address] [place] [address] [phone] --  [facility]  Pending - Request Sent N/A [address], [place] [address] [phone] --  [facility]  Pending - Request Sent N/A [address] [place], [place] [address] [phone] --  [facility]  Pending - Request Sent N/A [address] [place] [address] [phone] --  [facility]  Pending - Request Sent N/A [address] [place] [address] [phone] --  [facility]  Pending - Request Sent N/A [address], [place] [address] [phone] --  [facility]  Pending - Request Sent N/A [address], [place] [address] [phone] --  [facility]  Pending - No Request Sent N/A [address] [place] [address] [phone] --  [facility]  Pending - No Request Sent N/A [address] [place] [address] [phone] --  [facility]  Pending - No Request Sent N/A [address] [place] [address] [phone] --  [facility]  Pending - No Request Sent N/A [address] [place] [address] [phone] --  [facility]  Pending - No Request Sent N/A [address] [place], [place] [address] [phone] --  [facility]  Pending - No Request Sent N/A [address], [place] [address] [phone] --  [facility]  Pending - No Request Sent N/A [address] [place] [address] [phone] --   TTS will continue to seek bed placement.  Crissie Reese, MSW, LCSW-A, LCAS-A Phone: [phone] Disposition/TOC

## 2020-10-02 NOTE — ED Notes (Addendum)
Patient refused hygiene care and not talking to anybody

## 2020-10-02 NOTE — ED Notes (Signed)
Pt refusing to talk and refusing medications.

## 2020-10-03 NOTE — ED Notes (Signed)
Pt mother is visiting.  

## 2020-10-03 NOTE — ED Provider Notes (Signed)
Emergency Medicine Observation Re-evaluation Note  Justin [name]. is a 30 y.o. male, seen on rounds today.  Pt initially presented to the ED for complaints of Psychiatric Evaluation Currently, the patient is awake, sitting on end of bed, eating breakfast, calm and compliant.   Physical Exam  BP 114/72 (BP Location: Left Arm)   Pulse 82   Temp 98.4 F (36.9 C) (Oral)   Resp 15   Ht 5\' 8"  (1.727 m)   Wt 69 kg   SpO2 98%   BMI 23.13 kg/m  Physical Exam Vitals and nursing note reviewed.  Constitutional:      Appearance: Normal appearance.  HENT:     Head: Normocephalic and atraumatic.  Cardiovascular:     Rate and Rhythm: Normal rate and regular rhythm.  Pulmonary:     Effort: Pulmonary effort is normal. No respiratory distress.  Neurological:     General: No focal deficit present.     Mental Status: He is alert.  Psychiatric:        Speech: He is noncommunicative.     Comments: Calm, compliant, eating breakfast     ED Course / MDM  EKG:   I have reviewed the labs performed to date as well as medications administered while in observation.  Recent changes in the last 24 hours include [facility].   Plan  Current plan is for [facility].   [name] is not under involuntary commitment.     [name] P, DO 10/03/20 [phone]

## 2021-01-25 IMAGING — CR DG FOOT COMPLETE 3+V*R*
3 series · 3 of 3 positions shown · non-contrast
Comparison: None.

CLINICAL DATA: Patient was bitten by spider on plantar portion of
right foot, per patient, two days ago. Patient foot is slightly
swollen.

EXAM:
RIGHT FOOT COMPLETE - 3+ VIEW

[x foot ap right]
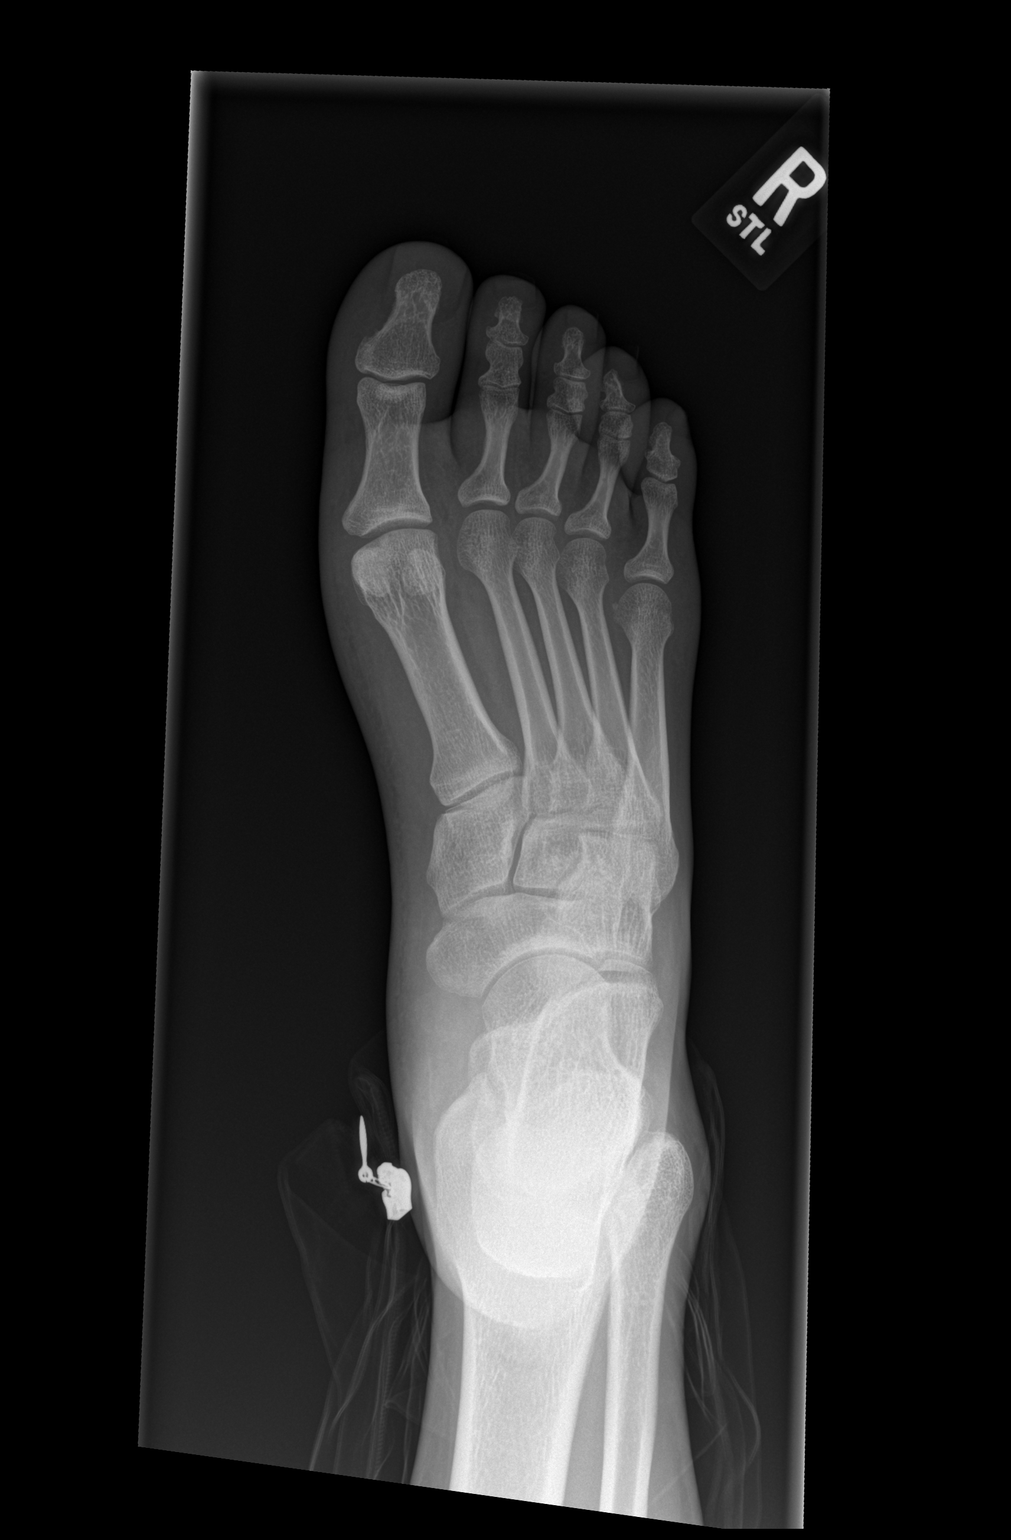

[x foot obl right]
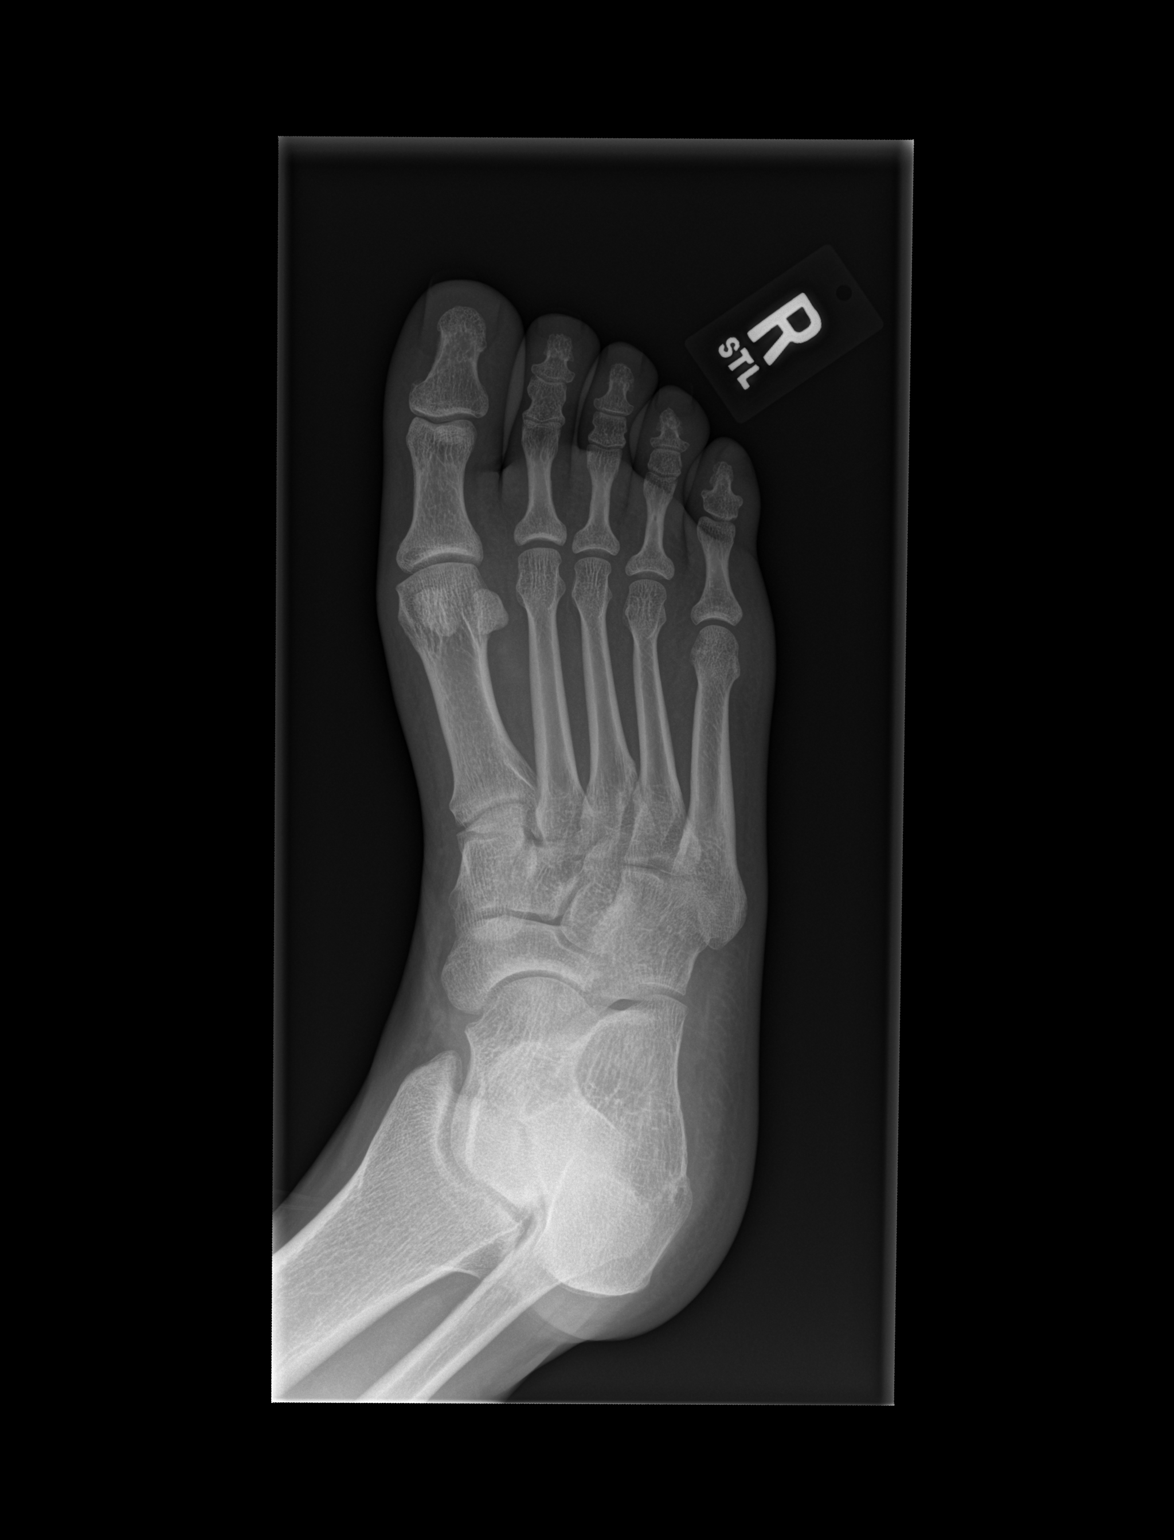

[x foot lat right]
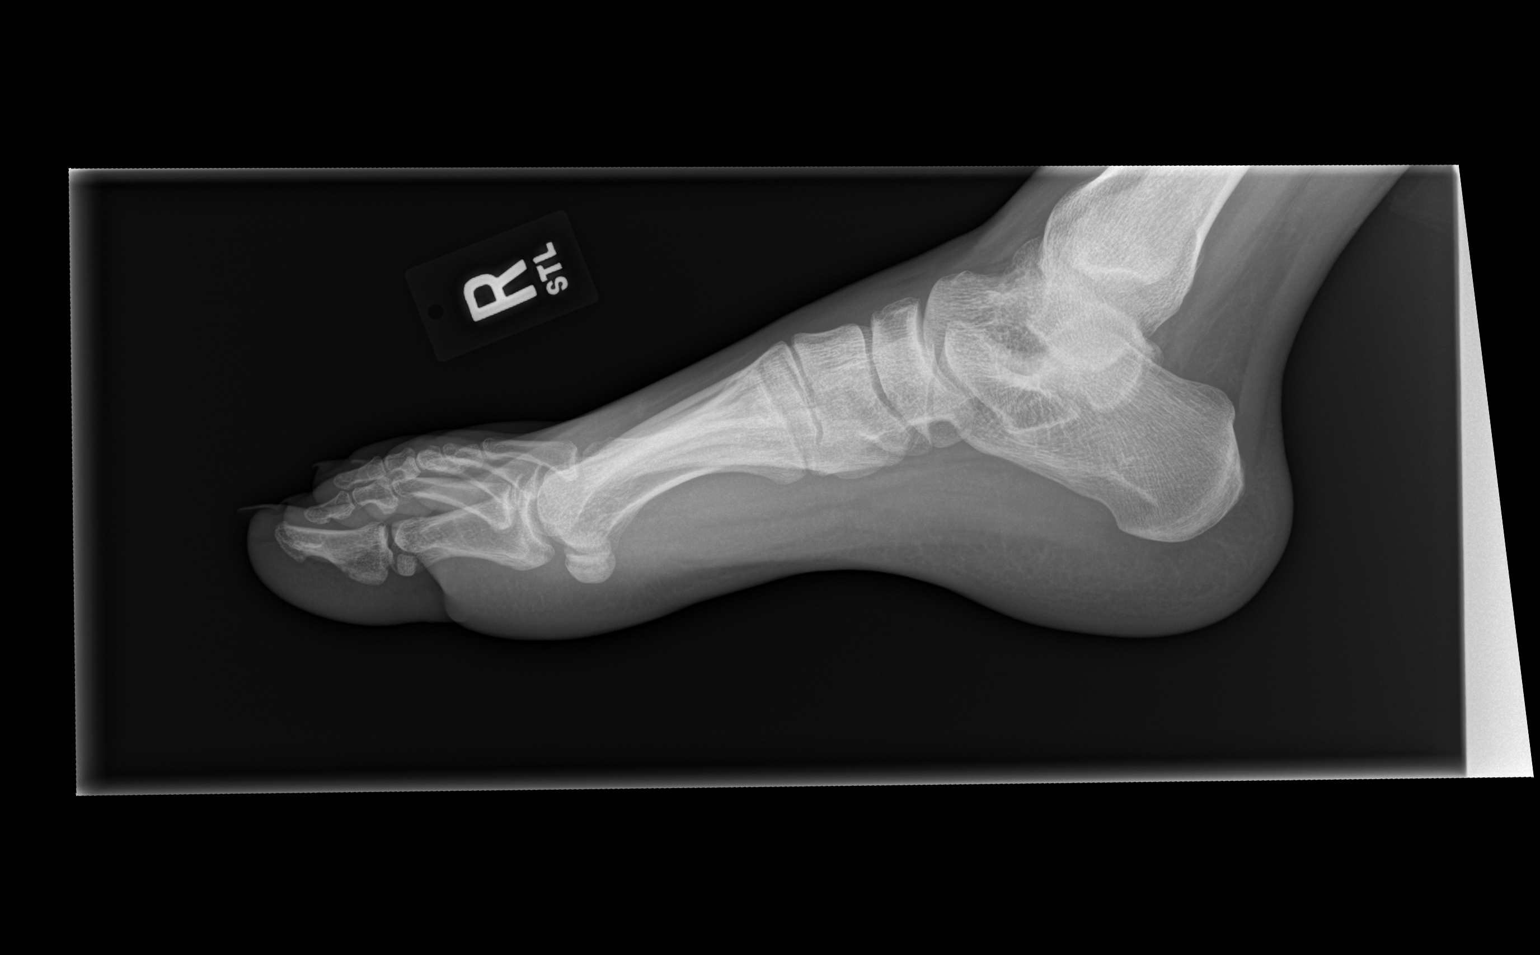

[3 of 3 positions shown; findings below may reference images not displayed]

FINDINGS: Alignment is normal. No acute or suspicious osseous lesion. Soft
tissues about the RIGHT foot are unremarkable. No soft tissue gas
seen.
IMPRESSION: Negative.

## 2021-07-05 ENCOUNTER — Ambulatory Visit (HOSPITAL_COMMUNITY)
Admission: AD | Admit: 2021-07-05 | Discharge: 2021-07-05 | Disposition: A | Discharge status: Home / Self Care | Payer: Medicaid Other | Attending: Psychiatry | Admitting: Psychiatry

## 2021-07-05 NOTE — H&P (Signed)
Behavioral Health Medical Screening Exam  HPI: Justin [name]. is a 31 y.o. male who presented voluntarily as a walk-in to [facility] with complaint of alcohol intoxication and hearing voices.  Patient has past psychiatric history of mild cognitive disorder, acute psychosis, schizophrenia, paranoid type.  Patient is homeless and presented all packed up to be admitted as an inpatient to the unit.  On assessment today, patient was sitting calmly in the screening room on the bench.  Chart reviewed and findings shared with the treatment team and discussed with Dr. Lucianne Muss.  Patient is alert and oriented x 3.  Speech fluent with normal volume and pattern.  Eye contact fair during the encounter.  No tremors noted to extremities and no other signs of alcohol withdrawal symptoms.  Mood is anxious and affect is blunted.  Thought process coherent and linear. Thought content logical and within normal limits.  Memory, judgment, and insight fair.  Patient reported drinking problem has been going on all his life.  When asked about triggers patient stated not sure.   Patient denied SI and HI. He endorsed AVH stating that he hears stuff and sees ghost.  Not very specific on auditory hallucination. Endorse anxiety, however, unable to rate anxiety on a scale of 0-10, 10 being the highest. When I ask how many hours he slept last night stated, "not sure", then added maybe 6 hours.  Endorsed support system as his mother. Endorsed good appetite. Currently receiving therapy through [facility]. Currently not being followed by psychiatrist. Medication managed by [facility]. Denied drug and tobacco use or dependence. Denies self-injurious behaviors. Denied history of abuse or trauma.   Denied access to firearms and denies symptoms of depression.  Disposition: Patient did not meet the criteria for inpatient psychiatric admission. Patient was referred back to [facility] behavioral  health services for therapy and medication management.  Patient left [facility] without any incidents.  Total Time spent with patient: 1 hour  Psychiatric Specialty Exam:  Presentation  General Appearance: Appropriate for Environment; Casual; Fairly Groomed  Eye Contact:Fair  Speech:Clear and Coherent; Normal Rate  Speech Volume:Normal  Handedness:Right  Mood and Affect  Mood:Anxious  Affect:Blunt  Thought Process  Thought Processes:Coherent; Linear  Descriptions of Associations:Tangential  Orientation:Full (Time, Place and Person)  Thought Content:Logical; WDL  History of Schizophrenia/Schizoaffective disorder:No data recorded Duration of Psychotic Symptoms:No data recorded Hallucinations:Hallucinations: Auditory; Visual Description of Auditory Hallucinations: hearing stuff Description of Visual Hallucinations: seeing ghost  Ideas of Reference:None  Suicidal Thoughts:Suicidal Thoughts: No  Homicidal Thoughts:Homicidal Thoughts: No  Sensorium  Memory:Immediate Fair; Recent Fair; Remote Fair  Judgment:Fair  Insight:Fair  Executive Functions  Concentration:Fair  Attention Span:Fair  Recall:Fair  Fund of Knowledge:Fair  Language:Good  Psychomotor Activity  Psychomotor Activity:Psychomotor Activity: Normal  Assets  Assets:Communication Skills; Physical Health  Sleep  Sleep:Sleep: Good Number of Hours of Sleep: 6  Physical Exam: Physical Exam Vitals and nursing note reviewed.  Constitutional:      Appearance: Normal appearance.  HENT:     Head: Normocephalic and atraumatic.     Right Ear: External ear normal.     Left Ear: External ear normal.     Nose: Nose normal.     Mouth/Throat:     Mouth: Mucous membranes are moist.     Pharynx: Oropharynx is clear.  Eyes:     Extraocular Movements: Extraocular movements intact.     Conjunctiva/sclera: Conjunctivae normal.     Pupils: Pupils are equal, round, and  reactive to light.  Cardiovascular:      Rate and Rhythm: Normal rate.     Pulses: Normal pulses.  Pulmonary:     Effort: Pulmonary effort is normal.  Abdominal:     Palpations: Abdomen is soft.  Genitourinary:    Comments: deferred Musculoskeletal:        General: Normal range of motion.     Cervical back: Normal range of motion.  Skin:    General: Skin is warm.  Neurological:     General: No focal deficit present.     Mental Status: He is alert and oriented to person, place, and time.  Psychiatric:        Behavior: Behavior normal.   Review of Systems  Constitutional: Negative.  Negative for chills and fever.  HENT: Negative.  Negative for hearing loss and tinnitus.   Eyes: Negative.  Negative for blurred vision and double vision.  Respiratory: Negative.  Negative for cough, sputum production, shortness of breath and wheezing.   Cardiovascular: Negative.  Negative for chest pain and palpitations.  Gastrointestinal: Negative.  Negative for abdominal pain, constipation, diarrhea, heartburn, nausea and vomiting.  Genitourinary: Negative.  Negative for dysuria, frequency and urgency.  Musculoskeletal: Negative.  Negative for back pain, falls, joint pain, myalgias and neck pain.  Skin: Negative.  Negative for itching and rash.  Neurological: Negative.  Negative for dizziness, tingling, tremors, sensory change, speech change, focal weakness, seizures, loss of consciousness, weakness and headaches.  Endo/Heme/Allergies: Negative.  Negative for environmental allergies and polydipsia. Does not bruise/bleed easily.  Psychiatric/Behavioral:  Positive for substance abuse. The patient is nervous/anxious.   Blood pressure 123/87, pulse 96, temperature 98.5 F (36.9 C), temperature source Oral, resp. rate 18. There is no height or weight on file to calculate BMI.  Musculoskeletal: Strength & Muscle Tone: within normal limits Gait & Station: normal Patient leans: N/A  Recommendations: Based on my evaluation the patient does not  appear to have an emergency psychiatric condition.  Cecilie Lowers, FNP 07/05/2021, 7:34 PM

## 2021-07-25 ENCOUNTER — Encounter: Payer: Self-pay | Admitting: Family Medicine

## 2021-07-25 ENCOUNTER — Ambulatory Visit (INDEPENDENT_AMBULATORY_CARE_PROVIDER_SITE_OTHER): Payer: Medicaid Other | Admitting: Family Medicine

## 2021-07-25 VITALS — BP 119/79 | HR 104 | Temp 98.1°F | Resp 16 | Ht 65.0 in | Wt 206.6 lb

## 2021-07-25 DIAGNOSIS — Z7689 Persons encountering health services in other specified circumstances: Secondary | ICD-10-CM | POA: Diagnosis not present

## 2021-07-25 DIAGNOSIS — K219 Gastro-esophageal reflux disease without esophagitis: Secondary | ICD-10-CM | POA: Diagnosis not present

## 2021-07-25 DIAGNOSIS — J4520 Mild intermittent asthma, uncomplicated: Secondary | ICD-10-CM | POA: Diagnosis not present

## 2021-07-25 MED ORDER — ALBUTEROL SULFATE HFA 108 (90 BASE) MCG/ACT IN AERS: 2.0000 | INHALATION_SPRAY | Freq: Four times a day (QID) | RESPIRATORY_TRACT | 2 refills | Status: DC | PRN

## 2021-07-25 MED ORDER — OMEPRAZOLE 40 MG PO CPDR: 40.0000 mg | DELAYED_RELEASE_CAPSULE | Freq: Every day | ORAL | 2 refills | Status: AC

## 2021-07-25 NOTE — Progress Notes (Signed)
Patient said he would like an overall check. Patient c/o vision,hearing,snoring, and vomiting when he eats food.  New pt

## 2021-07-27 ENCOUNTER — Encounter: Payer: Self-pay | Admitting: Family Medicine

## 2021-07-27 NOTE — Progress Notes (Signed)
Established Patient Office Visit  Subjective    Patient ID: Justin [name]., male    DOB: Dec 22, 1990  Age: 31 y.o. MRN: [id number]  CC:  Chief Complaint  Patient presents with   Establish Care    HPI Justin [name]. presents to establish care and for complaint of reflux sx. Patient history was somewhat difficult to obtain.    Outpatient Encounter Medications as of 07/25/2021  Medication Sig   albuterol (VENTOLIN HFA) 108 (90 Base) MCG/ACT inhaler Inhale 2 puffs into the lungs every 6 (six) hours as needed for wheezing or shortness of breath.   benztropine (COGENTIN) 1 MG tablet Take 1 tablet (1 mg total) by mouth 2 (two) times daily.   citalopram (CELEXA) 20 MG tablet Take 1 tablet (20 mg total) by mouth daily.   omeprazole (PRILOSEC) 40 MG capsule Take 1 capsule (40 mg total) by mouth daily.   Paliperidone Palmitate (INVEGA SUSTENNA IM) Inject 1 Dose into the muscle every 6 (six) weeks.   haloperidol (HALDOL) 10 MG tablet Bid x 2 weeks then 1 at  hs (Patient not taking: Reported on 07/25/2021)   haloperidol decanoate (HALDOL DECANOATE) 100 MG/ML injection Inject 1.5 mLs (150 mg total) into the muscle every 28 (twenty-eight) days. (Patient not taking: Reported on 09/27/2020)   hydrocortisone cream 1 % Apply to affected area 2 times daily (Patient not taking: Reported on 09/27/2020)   metroNIDAZOLE (FLAGYL) 500 MG tablet Take 1 tablet (500 mg total) by mouth 2 (two) times daily. (Patient not taking: Reported on 09/27/2020)   vitamin B-12 1000 MCG tablet Take 1 tablet (1,000 mcg total) by mouth daily. For low B-12 replacement (Patient not taking: Reported on 09/27/2020)   No facility-administered encounter medications on file as of 07/25/2021.    Past Medical History:  Diagnosis Date   Abnormal behavior    Homelessness    No significant past medical history    Schizophrenia (HCC)     History reviewed. No pertinent surgical history.  Family History  Problem  Relation Age of Onset   Depression Father    Bipolar disorder Father    Alcohol abuse Father     Social History   Socioeconomic History   Marital status: Single    Spouse name: Not on file   Number of children: Not on file   Years of education: Not on file   Highest education level: Not on file  Occupational History   Not on file  Tobacco Use   Smoking status: Never   Smokeless tobacco: Never  Vaping Use   Vaping Use: Never used  Substance and Sexual Activity   Alcohol use: No   Drug use: No   Sexual activity: Yes  Other Topics Concern   Not on file  Social History Narrative   ** Merged History Encounter **       Social Determinants of Health   Financial Resource Strain: Not on file  Food Insecurity: Not on file  Transportation Needs: Not on file  Physical Activity: Not on file  Stress: Not on file  Social Connections: Not on file  Intimate Partner Violence: Not on file    Review of Systems  All other systems reviewed and are negative.       Objective    BP 119/79   Pulse (!) 104   Temp 98.1 F (36.7 C) (Oral)   Resp 16   Ht 5\' 5"  (1.651 m)   Wt 206 lb 9.6  oz (93.7 kg)   SpO2 96%   BMI 34.38 kg/m   Physical Exam Vitals and nursing note reviewed.  Constitutional:      General: He is not in acute distress. HENT:     Mouth/Throat:     Mouth: Mucous membranes are moist.     Pharynx: Oropharynx is clear.  Cardiovascular:     Rate and Rhythm: Normal rate and regular rhythm.  Pulmonary:     Effort: Pulmonary effort is normal.     Breath sounds: Normal breath sounds.  Abdominal:     Palpations: Abdomen is soft.     Tenderness: There is no abdominal tenderness.  Neurological:     General: No focal deficit present.     Mental Status: He is alert and oriented to person, place, and time.         Assessment & Plan:   1. Gastroesophageal reflux disease, unspecified whether esophagitis present Omeprazole prescribed.   2. Mild intermittent  reactive airway disease without complication Albuterol MDI prescribed  3. Encounter to establish care     Return in about 4 weeks (around 08/22/2021) for follow up, physical.   Justin Raymond, MD

## 2021-08-22 ENCOUNTER — Encounter: Payer: Medicaid Other | Admitting: Family Medicine

## 2021-10-03 ENCOUNTER — Encounter (HOSPITAL_COMMUNITY): Payer: Self-pay | Admitting: *Deleted

## 2021-10-03 ENCOUNTER — Ambulatory Visit (HOSPITAL_COMMUNITY)
Admission: EM | Admit: 2021-10-03 | Discharge: 2021-10-03 | Disposition: A | Discharge status: Home / Self Care | Payer: Medicaid Other | Attending: Family Medicine | Admitting: Family Medicine

## 2021-10-03 DIAGNOSIS — Z113 Encounter for screening for infections with a predominantly sexual mode of transmission: Secondary | ICD-10-CM | POA: Insufficient documentation

## 2021-10-03 DIAGNOSIS — N342 Other urethritis: Secondary | ICD-10-CM | POA: Insufficient documentation

## 2021-10-03 DIAGNOSIS — R197 Diarrhea, unspecified: Secondary | ICD-10-CM | POA: Insufficient documentation

## 2021-10-03 LAB — POCT URINALYSIS DIPSTICK, ED / UC
Bilirubin Urine: NEGATIVE
Glucose, UA: NEGATIVE mg/dL
Ketones, ur: NEGATIVE mg/dL
Leukocytes,Ua: NEGATIVE
Nitrite: NEGATIVE
Protein, ur: NEGATIVE mg/dL
Specific Gravity, Urine: 1.02 (ref 1.005–1.030)
Urobilinogen, UA: 0.2 mg/dL (ref 0.0–1.0)
pH: 6 (ref 5.0–8.0)

## 2021-10-03 LAB — HIV ANTIBODY (ROUTINE TESTING W REFLEX): HIV Screen 4th Generation wRfx: NONREACTIVE

## 2021-10-03 MED ORDER — CEFTRIAXONE SODIUM 500 MG IJ SOLR
INTRAMUSCULAR | Status: AC
  Filled 2021-10-03: qty 500

## 2021-10-03 MED ORDER — CEFTRIAXONE SODIUM 500 MG IJ SOLR
500.0000 mg | Freq: Once | INTRAMUSCULAR | Status: AC
  Administered 2021-10-03: 500 mg via INTRAMUSCULAR

## 2021-10-03 MED ORDER — LIDOCAINE HCL (PF) 1 % IJ SOLN
INTRAMUSCULAR | Status: AC
  Filled 2021-10-03: qty 2

## 2021-10-03 MED ORDER — DIPHENOXYLATE-ATROPINE 2.5-0.025 MG PO TABS: 1.0000 | ORAL_TABLET | Freq: Four times a day (QID) | ORAL | 0 refills | Status: DC | PRN

## 2021-10-03 NOTE — Discharge Instructions (Addendum)
Staff will notify of any things positive on the swab or on your blood work  The urinalysis was normal  You have been given a shot of ceftriaxone 500 mg; this is for potential infection with gonorrhea  You can take the Lomotil 1 tablet 4 times daily as needed for diarrhea.  If the diarrhea continues in the next 48 hours, please collect the stool specim for the tests ordered
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
## 2021-10-03 NOTE — ED Provider Notes (Signed)
[facility]    CSN: [id number] Arrival date & time: 10/03/21  1008      History   Chief Complaint Chief Complaint  Patient presents with   Penis Pain   Diarrhea    HPI Justin [name] Justin [name]. is a 31 y.o. male.    Penis Pain  Diarrhea   Here for 3-day history of dysuria.  No penile discharge and no itching or rash. He has also had a 3-day history of very frequent diarrhea.  The diarrhea is watery but does not have any blood in it.  No nausea or vomiting.  He only has some tummy cramping right before he has a bowel movement.  No recent administration of antibiotics.  He does want to do blood work for HIV and RPR screening        Past Medical History:  Diagnosis Date   Abnormal behavior    Homelessness    No significant past medical history    Schizophrenia [facility])     Patient Active Problem List   Diagnosis Date Noted   Schizophrenia, paranoid type (HCC) 07/18/2015   Acute psychosis (HCC)    Mild cognitive disorder 02/01/2015   Hx of epistaxis 02/01/2015   Vitamin B12 deficiency 01/22/2015   TSH deficiency 01/22/2015   No significant past medical history     History reviewed. No pertinent surgical history.     Home Medications    Prior to Admission medications   Medication Sig Start Date End Date Taking? Authorizing Provider  diphenoxylate-atropine (LOMOTIL) 2.5-0.025 MG tablet Take 1 tablet by mouth 4 (four) times daily as needed for diarrhea or loose stools. 10/03/21  Yes Zenia Resides, MD  albuterol (VENTOLIN HFA) 108 (90 Base) MCG/ACT inhaler Inhale 2 puffs into the lungs every 6 (six) hours as needed for wheezing or shortness of breath. 07/25/21   Georganna Skeans, MD  benztropine (COGENTIN) 1 MG tablet Take 1 tablet (1 mg total) by mouth 2 (two) times daily. 03/25/18   Malvin Johns, MD  citalopram (CELEXA) 20 MG tablet Take 1 tablet (20 mg total) by mouth daily. 03/26/18   Malvin Johns, MD  omeprazole (PRILOSEC) 40 MG capsule Take 1  capsule (40 mg total) by mouth daily. 07/25/21   Georganna Skeans, MD  Paliperidone Palmitate (INVEGA SUSTENNA IM) Inject 1 Dose into the muscle every 6 (six) weeks.    [provider]    Family History Family History  Problem Relation Age of Onset   Depression Father    Bipolar disorder Father    Alcohol abuse Father     Social History Social History   Tobacco Use   Smoking status: Never   Smokeless tobacco: Never  Vaping Use   Vaping Use: Never used  Substance Use Topics   Alcohol use: No   Drug use: No     Allergies   Bee pollen   Review of Systems Review of Systems  Gastrointestinal:  Positive for diarrhea.  Genitourinary:  Positive for penile pain.     Physical Exam Triage Vital Signs ED Triage Vitals  Enc Vitals Group     BP 10/03/21 1121 100/79     Pulse Rate 10/03/21 1121 91     Resp 10/03/21 1121 18     Temp 10/03/21 1121 98.6 F (37 C)     Temp Source 10/03/21 1121 Oral     SpO2 10/03/21 1121 97 %     Weight --      Height --  Head Circumference --      Peak Flow --      Pain Score 10/03/21 1118 8     Pain Loc --      Pain Edu? --      Excl. in GC? --    No data found.  Updated Vital Signs BP 100/79 (BP Location: Left Arm)   Pulse 91   Temp 98.6 F (37 C) (Oral)   Resp 18   SpO2 97%   Visual Acuity Right Eye Distance:   Left Eye Distance:   Bilateral Distance:    Right Eye Near:   Left Eye Near:    Bilateral Near:     Physical Exam Vitals reviewed.  Constitutional:      General: He is not in acute distress.    Appearance: He is not ill-appearing, toxic-appearing or diaphoretic.  HENT:     Mouth/Throat:     Mouth: Mucous membranes are moist.     Pharynx: No oropharyngeal exudate or posterior oropharyngeal erythema.  Eyes:     Conjunctiva/sclera: Conjunctivae normal.     Pupils: Pupils are equal, round, and reactive to light.  Cardiovascular:     Rate and Rhythm: Normal rate and regular rhythm.     Heart  sounds: No murmur heard. Pulmonary:     Effort: Pulmonary effort is normal.     Breath sounds: Normal breath sounds. No stridor. No wheezing, rhonchi or rales.  Abdominal:     General: There is no distension.     Palpations: Abdomen is soft. There is no mass.     Tenderness: There is no abdominal tenderness. There is no guarding.  Musculoskeletal:     Cervical back: Neck supple.  Lymphadenopathy:     Cervical: No cervical adenopathy.  Skin:    Coloration: Skin is not jaundiced or pale.  Neurological:     General: No focal deficit present.     Mental Status: He is alert and oriented to person, place, and time.  Psychiatric:        Behavior: Behavior normal.      UC Treatments / Results  Labs (all labs ordered are listed, but only abnormal results are displayed) Labs Reviewed  POCT URINALYSIS DIPSTICK, ED / UC - Abnormal; Notable for the following components:      Result Value   Hgb urine dipstick TRACE (*)    All other components within normal limits  HIV ANTIBODY (ROUTINE TESTING W REFLEX)  RPR  CYTOLOGY, (ORAL, ANAL, URETHRAL) ANCILLARY ONLY    EKG   Radiology No results found.  Procedures Procedures (including critical care time)  Medications Ordered in UC Medications  cefTRIAXone (ROCEPHIN) injection 500 mg (has no administration in time range)    Initial Impression / Assessment and Plan / UC Course  I have reviewed the triage vital signs and the nursing notes.  Pertinent labs & imaging results that were available during my care of the patient were reviewed by me and considered in my medical decision making (see chart for details).     Discussed empiric treatment and he would like to have the ceftriaxone shot at least today.  Since he is having diarrhea I am going to hold off on a doxycycline prescription until he has his swab resulted.  Lomotil for the diarrhea, and stool samples ordered.  He is given education on safe sex practices and on individual  STIs Final Clinical Impressions(s) / UC Diagnoses   Final diagnoses:  Urethritis  Screen for STD (  sexually transmitted disease)  Diarrhea, unspecified type     Discharge Instructions      Staff will notify of any things positive on the swab or on your blood work  The urinalysis was normal  You have been given a shot of ceftriaxone 500 mg; this is for potential infection with gonorrhea  You can take the Lomotil 1 tablet 4 times daily as needed for diarrhea.  If the diarrhea continues in the next 48 hours, please collect the stool specim for the tests ordered       ED Prescriptions     Medication Sig Dispense Auth. Provider   diphenoxylate-atropine (LOMOTIL) 2.5-0.025 MG tablet Take 1 tablet by mouth 4 (four) times daily as needed for diarrhea or loose stools. 10 tablet Windy Carina Gwenlyn Perking, MD      I have reviewed the PDMP during this encounter.   Barrett Henle, MD 10/03/21 1201

## 2021-10-04 LAB — RPR: RPR Ser Ql: NONREACTIVE

## 2022-01-19 ENCOUNTER — Other Ambulatory Visit: Payer: Self-pay

## 2022-01-19 ENCOUNTER — Encounter (HOSPITAL_COMMUNITY): Payer: Self-pay

## 2022-01-19 ENCOUNTER — Emergency Department (HOSPITAL_COMMUNITY): Payer: Medicaid Other

## 2022-01-19 ENCOUNTER — Emergency Department (HOSPITAL_COMMUNITY)
Admission: EM | Admit: 2022-01-19 | Discharge: 2022-01-19 | Disposition: A | Discharge status: Home / Self Care | Payer: Medicaid Other | Attending: Emergency Medicine | Admitting: Emergency Medicine

## 2022-01-19 DIAGNOSIS — S60812A Abrasion of left wrist, initial encounter: Secondary | ICD-10-CM | POA: Diagnosis not present

## 2022-01-19 DIAGNOSIS — Y904 Blood alcohol level of 80-99 mg/100 ml: Secondary | ICD-10-CM | POA: Diagnosis not present

## 2022-01-19 DIAGNOSIS — S60512A Abrasion of left hand, initial encounter: Secondary | ICD-10-CM | POA: Diagnosis not present

## 2022-01-19 DIAGNOSIS — F1010 Alcohol abuse, uncomplicated: Secondary | ICD-10-CM | POA: Insufficient documentation

## 2022-01-19 DIAGNOSIS — S0083XA Contusion of other part of head, initial encounter: Secondary | ICD-10-CM | POA: Diagnosis not present

## 2022-01-19 DIAGNOSIS — Y92410 Unspecified street and highway as the place of occurrence of the external cause: Secondary | ICD-10-CM | POA: Diagnosis not present

## 2022-01-19 DIAGNOSIS — R519 Headache, unspecified: Secondary | ICD-10-CM | POA: Diagnosis present

## 2022-01-19 DIAGNOSIS — R109 Unspecified abdominal pain: Secondary | ICD-10-CM | POA: Insufficient documentation

## 2022-01-19 DIAGNOSIS — M79644 Pain in right finger(s): Secondary | ICD-10-CM | POA: Insufficient documentation

## 2022-01-19 LAB — I-STAT CHEM 8, ED
BUN: 9 mg/dL (ref 6–20)
Calcium, Ion: 1.21 mmol/L (ref 1.15–1.40)
Chloride: 102 mmol/L (ref 98–111)
Creatinine, Ser: 1.1 mg/dL (ref 0.61–1.24)
Glucose, Bld: 107 mg/dL — ABNORMAL HIGH (ref 70–99)
HCT: 41 % (ref 39.0–52.0)
Hemoglobin: 13.9 g/dL (ref 13.0–17.0)
Potassium: 3.9 mmol/L (ref 3.5–5.1)
Sodium: 140 mmol/L (ref 135–145)
TCO2: 25 mmol/L (ref 22–32)

## 2022-01-19 LAB — CBC
HCT: 40.7 % (ref 39.0–52.0)
Hemoglobin: 14.8 g/dL (ref 13.0–17.0)
MCH: 30.6 pg (ref 26.0–34.0)
MCHC: 36.4 g/dL — ABNORMAL HIGH (ref 30.0–36.0)
MCV: 84.3 fL (ref 80.0–100.0)
Platelets: 217 10*3/uL (ref 150–400)
RBC: 4.83 MIL/uL (ref 4.22–5.81)
RDW: 12.2 % (ref 11.5–15.5)
WBC: 7 10*3/uL (ref 4.0–10.5)
nRBC: 0 % (ref 0.0–0.2)

## 2022-01-19 LAB — COMPREHENSIVE METABOLIC PANEL
ALT: 20 U/L (ref 0–44)
AST: 21 U/L (ref 15–41)
Albumin: 3.9 g/dL (ref 3.5–5.0)
Alkaline Phosphatase: 70 U/L (ref 38–126)
Anion gap: 9 (ref 5–15)
BUN: 9 mg/dL (ref 6–20)
CO2: 25 mmol/L (ref 22–32)
Calcium: 9.1 mg/dL (ref 8.9–10.3)
Chloride: 105 mmol/L (ref 98–111)
Creatinine, Ser: 1.07 mg/dL (ref 0.61–1.24)
GFR, Estimated: >60 mL/min (ref >60)
Glucose, Bld: 113 mg/dL — ABNORMAL HIGH (ref 70–99)
Potassium: 3.9 mmol/L (ref 3.5–5.1)
Sodium: 139 mmol/L (ref 135–145)
Total Bilirubin: 0.5 mg/dL (ref 0.3–1.2)
Total Protein: 7.2 g/dL (ref 6.5–8.1)

## 2022-01-19 LAB — ETHANOL: Alcohol, Ethyl (B): 91 mg/dL — ABNORMAL HIGH (ref <10)

## 2022-01-19 LAB — LIPASE, BLOOD: Lipase: 26 U/L (ref 11–51)

## 2022-01-19 MED ORDER — IOHEXOL 350 MG/ML SOLN
75.0000 mL | Freq: Once | INTRAVENOUS | Status: AC | PRN
  Administered 2022-01-19: 75 mL via INTRAVENOUS

## 2022-01-19 NOTE — ED Notes (Signed)
Pt resting in bed awaiting results. No obvious distress at this time.
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
## 2022-01-19 NOTE — ED Notes (Signed)
Pt is ambulatory with a steady gait.  
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
## 2022-02-06 NOTE — Congregational Nurse Program (Signed)
  Dept: [phone]   Congregational Nurse Program Note  Date of Encounter: 02/06/2022  Clinic visit for cough and congestion, requested cough drops.  BP 110/75, pulse 100 and regular, temp 98.5, O2 sat 96%. Denies body aches, chills and has no fever.  Has had flu vaccine and COVID-19 vaccine. Past Medical History: Past Medical History:  Diagnosis Date   Abnormal behavior    Homelessness    No significant past medical history    Schizophrenia ([place])     Encounter Details:  CNP Questionnaire - 02/06/22 1130       Questionnaire   Ask client: Do you give verbal consent for me to treat you today? Yes    Student Assistance N/A    Location Patient [facility]    Visit Setting with Client Organization    Patient Status Unhoused    Insurance Medicaid    Insurance/Financial Assistance Referral N/A    Medication N/A    Medical Provider Yes    Screening Referrals Made N/A    Medical Referrals Made N/A    Medical Appointment Made N/A    Recently w/o PCP, now 1st time PCP visit completed due to CNs referral or appointment made N/A    Food Have Food Insecurities    Transportation Need transportation assistance    Housing/Utilities No permanent housing    Interpersonal Safety Do not feel safe at current residence    Interventions Advocate/Support;Counsel;Educate    Abnormal to Normal Screening Since Last CN Visit N/A    Screenings CN Performed Blood Pressure;Pulse Ox    Sent Client to Lab for: N/A    Did client attend any of the following based off CNs referral or appointments made? N/A    ED Visit Averted N/A    Life-Saving Intervention Made N/A

## 2022-07-23 ENCOUNTER — Emergency Department (HOSPITAL_BASED_OUTPATIENT_CLINIC_OR_DEPARTMENT_OTHER)
Admission: EM | Admit: 2022-07-23 | Discharge: 2022-07-23 | Disposition: A | Discharge status: Home / Self Care | Payer: Worker's Compensation | Attending: Emergency Medicine | Admitting: Emergency Medicine

## 2022-07-23 ENCOUNTER — Other Ambulatory Visit: Payer: Self-pay

## 2022-07-23 ENCOUNTER — Encounter (HOSPITAL_BASED_OUTPATIENT_CLINIC_OR_DEPARTMENT_OTHER): Payer: Self-pay | Admitting: Emergency Medicine

## 2022-07-23 DIAGNOSIS — M79642 Pain in left hand: Secondary | ICD-10-CM | POA: Insufficient documentation

## 2022-07-23 DIAGNOSIS — G5602 Carpal tunnel syndrome, left upper limb: Secondary | ICD-10-CM | POA: Diagnosis not present

## 2022-07-23 MED ORDER — OXYCODONE-ACETAMINOPHEN 5-325 MG PO TABS: 1.0000 | ORAL_TABLET | Freq: Four times a day (QID) | ORAL | 0 refills | Status: DC | PRN

## 2022-07-23 MED ORDER — OXYCODONE-ACETAMINOPHEN 5-325 MG PO TABS
1.0000 | ORAL_TABLET | Freq: Once | ORAL | Status: AC
  Administered 2022-07-23: 1 via ORAL
  Filled 2022-07-23: qty 1
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
## 2022-07-23 NOTE — ED Notes (Signed)
Patient verbalizes understanding of discharge instructions. Opportunity for questioning and answers were provided. Patient discharged from ED.  °
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
## 2022-11-24 ENCOUNTER — Ambulatory Visit (HOSPITAL_COMMUNITY)
Admission: EM | Admit: 2022-11-24 | Discharge: 2022-11-24 | Disposition: A | Discharge status: Home / Self Care | Payer: MEDICAID

## 2022-11-24 ENCOUNTER — Encounter (HOSPITAL_COMMUNITY): Payer: Self-pay | Admitting: *Deleted

## 2022-11-24 ENCOUNTER — Other Ambulatory Visit: Payer: Self-pay

## 2022-11-24 DIAGNOSIS — M79644 Pain in right finger(s): Secondary | ICD-10-CM

## 2022-11-24 MED ORDER — IBUPROFEN 800 MG PO TABS: 800.0000 mg | ORAL_TABLET | Freq: Three times a day (TID) | ORAL | 0 refills | Status: DC

## 2022-11-24 NOTE — ED Triage Notes (Signed)
C/O tenderness and discoloration to right index finger onset 2-3 days ago. Denies any change in sensation.

## 2022-11-24 NOTE — Discharge Instructions (Addendum)
Your finger does not appear to be infected or broken.  You may have a bruise at the distal end of your index finger, for any pain or inflammation you can alternate between 800 mg of ibuprofen and 500 mg of Tylenol every 4-6 hours.  You can ice and elevate the finger as well.  If this issue persists, please follow-up with your primary care provider.

## 2022-11-24 NOTE — ED Provider Notes (Signed)
[facility]    CSN: [id number] Arrival date & time: 11/24/22  1002      History   Chief Complaint Chief Complaint  Patient presents with   Finger Pain    HPI Justin [name]. is a 32 y.o. male.   Patient presents to clinic for distal right index finger pain that has been present for the last 3 or 4 days.  There is a black mark to the distal tip of his finger and slight discoloration of the distal nailbed.  Patient thinks maybe he got bit by a spider or something, is unsure.  Denies any known injuries, crushing or trauma to the finger.  Reports sensation has been intact. Denies swelling or drainage.   Pain currently 8 out of 10, he took Tylenol for pain 2 days ago.  Upon further investigation patient reports that this mark has been present for the past month or so.     The history is provided by the patient and medical records.    Past Medical History:  Diagnosis Date   Abnormal behavior    Homelessness    Schizophrenia [facility])     Patient Active Problem List   Diagnosis Date Noted   Schizophrenia, paranoid type ([facility]) 07/18/2015   Acute psychosis ([facility])    Mild cognitive disorder 02/01/2015   Hx of epistaxis 02/01/2015   Vitamin B12 deficiency 01/22/2015   TSH deficiency 01/22/2015   No significant past medical history     History reviewed. No pertinent surgical history.     Home Medications    Prior to Admission medications   Medication Sig Start Date End Date Taking? Authorizing Provider  benztropine (COGENTIN) 1 MG tablet Take 1 tablet (1 mg total) by mouth 2 (two) times daily. 03/25/18  Yes Malvin Johns, MD  Divalproex Sodium (DEPAKOTE PO) Take by mouth.   Yes [provider]  ibuprofen (ADVIL) 800 MG tablet Take 1 tablet (800 mg total) by mouth 3 (three) times daily. 11/24/22  Yes Rinaldo Ratel, [place], FNP  Paliperidone Palmitate (INVEGA SUSTENNA IM) Inject 1 Dose into the muscle every 6 (six) weeks.   Yes [provider]  albuterol (VENTOLIN HFA) 108 (90 Base) MCG/ACT inhaler Inhale 2 puffs into the lungs every 6 (six) hours as needed for wheezing or shortness of breath. 07/25/21   Georganna Skeans, MD  citalopram (CELEXA) 20 MG tablet Take 1 tablet (20 mg total) by mouth daily. 03/26/18   Malvin Johns, MD  diphenoxylate-atropine (LOMOTIL) 2.5-0.025 MG tablet Take 1 tablet by mouth 4 (four) times daily as needed for diarrhea or loose stools. 10/03/21   Zenia Resides, MD  omeprazole (PRILOSEC) 40 MG capsule Take 1 capsule (40 mg total) by mouth daily. 07/25/21   Georganna Skeans, MD  oxyCODONE-acetaminophen (PERCOCET/ROXICET) 5-325 MG tablet Take 1 tablet by mouth every 6 (six) hours as needed for severe pain. 07/23/22   Smitty Knudsen, PA-C    Family History Family History  Problem Relation Age of Onset   Depression Father    Bipolar disorder Father    Alcohol abuse Father     Social History Social History   Tobacco Use   Smoking status: Never   Smokeless tobacco: Never  Vaping Use   Vaping status: Never Used  Substance Use Topics   Alcohol use: No   Drug use: No     Allergies   Bee venom and Other   Review of Systems Review of Systems  Per HPI  Physical Exam Triage Vital Signs ED Triage Vitals  Encounter Vitals Group     BP 11/24/22 1021 115/70     Systolic BP Percentile --      Diastolic BP Percentile --      Pulse Rate 11/24/22 1021 86     Resp 11/24/22 1021 18     Temp 11/24/22 1021 98.6 F (37 C)     Temp Source 11/24/22 1021 Oral     SpO2 11/24/22 1021 96 %     Weight --      Height --      Head Circumference --      Peak Flow --      Pain Score 11/24/22 1023 8     Pain Loc --      Pain Education --      Exclude from Growth Chart --    No data found.  Updated Vital Signs BP 115/70   Pulse 86   Temp 98.6 F (37 C) (Oral)   Resp 18   SpO2 96%   Visual Acuity Right Eye Distance:   Left Eye Distance:   Bilateral Distance:    Right Eye Near:    Left Eye Near:    Bilateral Near:     Physical Exam Vitals and nursing note reviewed.  Constitutional:      Appearance: Normal appearance.  HENT:     Head: Normocephalic and atraumatic.     Right Ear: External ear normal.     Left Ear: External ear normal.     Nose: Nose normal.     Mouth/Throat:     Mouth: Mucous membranes are moist.  Eyes:     Conjunctiva/sclera: Conjunctivae normal.  Cardiovascular:     Rate and Rhythm: Normal rate.     Pulses: Normal pulses.  Pulmonary:     Effort: Pulmonary effort is normal. No respiratory distress.  Musculoskeletal:        General: Normal range of motion.     Right hand: Tenderness present. No swelling or deformity. Normal range of motion. Normal strength. Normal sensation. There is no disruption of two-point discrimination. Normal capillary refill. Normal pulse.     Comments: Pinpoint thickening of skin and bruising to distal tip of right index finger with minor bruising to nailbed close to injury. Sensation intact with brisk capillary refill.   Skin:    General: Skin is warm and dry.     Capillary Refill: Capillary refill takes less than 2 seconds.  Neurological:     General: No focal deficit present.     Mental Status: He is alert and oriented to person, place, and time.  Psychiatric:        Mood and Affect: Mood normal.        Behavior: Behavior normal.      UC Treatments / Results  Labs (all labs ordered are listed, but only abnormal results are displayed) Labs Reviewed - No data to display  EKG   Radiology No results found.  Procedures Procedures (including critical care time)  Medications Ordered in UC Medications - No data to display  Initial Impression / Assessment and Plan / UC Course  I have reviewed the triage vital signs and the nursing notes.  Pertinent labs & imaging results that were available during my care of the patient were reviewed by me and considered in my medical decision making (see chart for  details).  Vitals and triage reviewed, patient is hemodynamically stable.  Distal end of right  index finger with pinpoint bruising, slight bruising at distal tip of nail as well.  Skin does appear thickened, could be related to callus or bruising.  No drainage.  No joint tenderness, erythema or swelling.  Low concern for infection.  Atraumatic, imaging deferred at this time.  Brisk capillary refill, sensation intact.  Advised pain management and primary care follow-up if this does not improve over the next few weeks.  Plan of care, follow-up care and return precautions given, no questions at this time.    Final Clinical Impressions(s) / UC Diagnoses   Final diagnoses:  Pain of finger of right hand     Discharge Instructions      Your finger does not appear to be infected or broken.  You may have a bruise at the distal end of your index finger, for any pain or inflammation you can alternate between 800 mg of ibuprofen and 500 mg of Tylenol every 4-6 hours.  You can ice and elevate the finger as well.  If this issue persists, please follow-up with your primary care provider.     ED Prescriptions     Medication Sig Dispense Auth. Provider   ibuprofen (ADVIL) 800 MG tablet Take 1 tablet (800 mg total) by mouth 3 (three) times daily. 21 tablet Eliaz Fout, [place], [place]      PDMP not reviewed this encounter.   Meagon Duskin, [place] N, [place] 11/24/22 [phone]

## 2022-12-08 ENCOUNTER — Ambulatory Visit (HOSPITAL_COMMUNITY)
Admission: EM | Admit: 2022-12-08 | Discharge: 2022-12-08 | Disposition: A | Discharge status: Home / Self Care | Payer: MEDICAID | Attending: Family Medicine | Admitting: Family Medicine

## 2022-12-08 ENCOUNTER — Encounter (HOSPITAL_COMMUNITY): Payer: Self-pay

## 2022-12-08 DIAGNOSIS — Z202 Contact with and (suspected) exposure to infections with a predominantly sexual mode of transmission: Secondary | ICD-10-CM | POA: Diagnosis present

## 2022-12-08 DIAGNOSIS — N342 Other urethritis: Secondary | ICD-10-CM | POA: Diagnosis present

## 2022-12-08 LAB — HIV ANTIBODY (ROUTINE TESTING W REFLEX): HIV Screen 4th Generation wRfx: NONREACTIVE

## 2022-12-08 MED ORDER — DOXYCYCLINE HYCLATE 100 MG PO CAPS: 100.0000 mg | ORAL_CAPSULE | Freq: Two times a day (BID) | ORAL | 0 refills | Status: AC

## 2022-12-08 NOTE — ED Provider Notes (Signed)
[facility]    CSN: [id number] Arrival date & time: 12/08/22  1002      History   Chief Complaint Chief Complaint  Patient presents with   Exposure to STD    HPI Justin [name]. is a 32 y.o. male.    Exposure to STD  Here for penile discharge and dysuria and itching.  It has been going on for 2 or 3 weeks.  His girlfriend has now told him that she is positive for chlamydia.  No fever or vomiting or abdominal pain.  No allergies to medications    Past Medical History:  Diagnosis Date   Abnormal behavior    Homelessness    Schizophrenia [facility])     Patient Active Problem List   Diagnosis Date Noted   Schizophrenia, paranoid type (HCC) 07/18/2015   Acute psychosis ([facility])    Mild cognitive disorder 02/01/2015   Hx of epistaxis 02/01/2015   Vitamin B12 deficiency 01/22/2015   TSH deficiency 01/22/2015   No significant past medical history     History reviewed. No pertinent surgical history.     Home Medications    Prior to Admission medications   Medication Sig Start Date End Date Taking? Authorizing Provider  doxycycline (VIBRAMYCIN) 100 MG capsule Take 1 capsule (100 mg total) by mouth 2 (two) times daily for 7 days. 12/08/22 12/15/22 Yes Zenia Resides, MD  albuterol (VENTOLIN HFA) 108 (90 Base) MCG/ACT inhaler Inhale 2 puffs into the lungs every 6 (six) hours as needed for wheezing or shortness of breath. 07/25/21   Georganna Skeans, MD  benztropine (COGENTIN) 1 MG tablet Take 1 tablet (1 mg total) by mouth 2 (two) times daily. 03/25/18   Malvin Johns, MD  citalopram (CELEXA) 20 MG tablet Take 1 tablet (20 mg total) by mouth daily. 03/26/18   Malvin Johns, MD  diphenoxylate-atropine (LOMOTIL) 2.5-0.025 MG tablet Take 1 tablet by mouth 4 (four) times daily as needed for diarrhea or loose stools. 10/03/21   Zenia Resides, MD  Divalproex Sodium (DEPAKOTE PO) Take by mouth.    [provider]  ibuprofen (ADVIL) 800 MG tablet Take  1 tablet (800 mg total) by mouth 3 (three) times daily. 11/24/22   Garrison, [place] N, FNP  omeprazole (PRILOSEC) 40 MG capsule Take 1 capsule (40 mg total) by mouth daily. 07/25/21   Georganna Skeans, MD  oxyCODONE-acetaminophen (PERCOCET/ROXICET) 5-325 MG tablet Take 1 tablet by mouth every 6 (six) hours as needed for severe pain. 07/23/22   Smitty Knudsen, PA-C  Paliperidone Palmitate (INVEGA SUSTENNA IM) Inject 1 Dose into the muscle every 6 (six) weeks.    [provider]    Family History Family History  Problem Relation Age of Onset   Depression Father    Bipolar disorder Father    Alcohol abuse Father     Social History Social History   Tobacco Use   Smoking status: Never   Smokeless tobacco: Never  Vaping Use   Vaping status: Never Used  Substance Use Topics   Alcohol use: No   Drug use: No     Allergies   Bee venom and Other   Review of Systems Review of Systems   Physical Exam Triage Vital Signs ED Triage Vitals  Encounter Vitals Group     BP 12/08/22 1015 122/77     Systolic BP Percentile --      Diastolic BP Percentile --      Pulse Rate 12/08/22 1015  80     Resp 12/08/22 1015 16     Temp 12/08/22 1015 97.8 F (36.6 C)     Temp Source 12/08/22 1015 Oral     SpO2 12/08/22 1015 98 %     Weight 12/08/22 1015 200 lb (90.7 kg)     Height 12/08/22 1015 5\' 5"  (1.651 m)     Head Circumference --      Peak Flow --      Pain Score 12/08/22 1014 0     Pain Loc --      Pain Education --      Exclude from Growth Chart --    No data found.  Updated Vital Signs BP 122/77 (BP Location: Left Arm)   Pulse 80   Temp 97.8 F (36.6 C) (Oral)   Resp 16   Ht 5\' 5"  (1.651 m)   Wt 90.7 kg   SpO2 98%   BMI 33.28 kg/m   Visual Acuity Right Eye Distance:   Left Eye Distance:   Bilateral Distance:    Right Eye Near:   Left Eye Near:    Bilateral Near:     Physical Exam Vitals reviewed.  Constitutional:      General: He is not in acute  distress.    Appearance: He is not ill-appearing, toxic-appearing or diaphoretic.  Skin:    Coloration: Skin is not pale.  Neurological:     General: No focal deficit present.     Mental Status: He is alert and oriented to person, place, and time.  Psychiatric:        Behavior: Behavior normal.      UC Treatments / Results  Labs (all labs ordered are listed, but only abnormal results are displayed) Labs Reviewed  HIV ANTIBODY (ROUTINE TESTING W REFLEX)  RPR  CYTOLOGY, (ORAL, ANAL, URETHRAL) ANCILLARY ONLY    EKG   Radiology No results found.  Procedures Procedures (including critical care time)  Medications Ordered in UC Medications - No data to display  Initial Impression / Assessment and Plan / UC Course  I have reviewed the triage vital signs and the nursing notes.  Pertinent labs & imaging results that were available during my care of the patient were reviewed by me and considered in my medical decision making (see chart for details).     Blood is drawn for HIV and syphilis testing.  Urethral self swab is done and staff will notify him of any positives on the swab or on the blood work, and treat per protocol.  Doxycycline is sent to treat with chlamydia exposure and symptoms empirically. Final Clinical Impressions(s) / UC Diagnoses   Final diagnoses:  Exposure to STD  Urethritis     Discharge Instructions      Take doxycycline 100 mg --1 capsule 2 times daily for 7 days  We have drawn blood to check HIV and syphilis test. Staff will notify you if there is anything positive on the swab or on the blood work.  You can use the QR code/website at the back of the summary paperwork to schedule yourself a new patient appointment with primary care      ED Prescriptions     Medication Sig Dispense Auth. Provider   doxycycline (VIBRAMYCIN) 100 MG capsule Take 1 capsule (100 mg total) by mouth 2 (two) times daily for 7 days. 14 capsule Marlinda Mike, Janace Aris, MD      PDMP not reviewed this encounter.   Zenia Resides, MD  12/08/22 1032  

## 2022-12-08 NOTE — ED Triage Notes (Signed)
Patient states that his partner has chlamydia. Patient having tingling in the groin and some discharge onset 2-3 weeks ago. Patient wanting swab and blood work today for everything.

## 2022-12-08 NOTE — Discharge Instructions (Signed)
Take doxycycline 100 mg --1 capsule 2 times daily for 7 days  We have drawn blood to check HIV and syphilis test. Staff will notify you if there is anything positive on the swab or on the blood work.  You can use the QR code/website at the back of the summary paperwork to schedule yourself a new patient appointment with primary care

## 2022-12-09 LAB — RPR: RPR Ser Ql: NONREACTIVE

## 2022-12-10 LAB — CYTOLOGY, (ORAL, ANAL, URETHRAL) ANCILLARY ONLY
Chlamydia: NEGATIVE
Comment: NEGATIVE
Comment: NEGATIVE
Comment: NORMAL
Neisseria Gonorrhea: NEGATIVE
Trichomonas: NEGATIVE

## 2023-11-25 ENCOUNTER — Ambulatory Visit (HOSPITAL_COMMUNITY): Admission: EM | Admit: 2023-11-25 | Discharge: 2023-11-25 | Disposition: A | Discharge status: Home / Self Care

## 2023-11-25 ENCOUNTER — Encounter (HOSPITAL_COMMUNITY): Payer: Self-pay | Admitting: *Deleted

## 2023-11-25 ENCOUNTER — Other Ambulatory Visit: Payer: Self-pay

## 2023-11-25 DIAGNOSIS — K529 Noninfective gastroenteritis and colitis, unspecified: Secondary | ICD-10-CM | POA: Diagnosis not present

## 2023-11-25 MED ORDER — ONDANSETRON 4 MG PO TBDP
8.0000 mg | ORAL_TABLET | Freq: Once | ORAL | Status: AC
  Administered 2023-11-25: 8 mg via ORAL

## 2023-11-25 MED ORDER — ONDANSETRON 8 MG PO TBDP: 8.0000 mg | ORAL_TABLET | Freq: Three times a day (TID) | ORAL | 0 refills | Status: DC | PRN

## 2023-11-25 MED ORDER — ONDANSETRON 4 MG PO TBDP
ORAL_TABLET | ORAL | Status: AC
  Filled 2023-11-25: qty 2
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
## 2023-11-25 NOTE — Discharge Instructions (Signed)

## 2023-11-25 NOTE — ED Provider Notes (Signed)
 [facility]    CSN: [id number] Arrival date & time: 11/25/23  1320      History   Chief Complaint Chief Complaint  Patient presents with   Emesis    HPI Justin Terral Rhodes Calvert. is a 33 y.o. male.   Pt presents today due abdominal pain, nausea, and vomiting since this morning at 2 am. Pt states that he believes he is experiencing food poisoning from eating [employer]. Pt states that he has had 4 episodes of vomiting and denies any loose stools.   The history is provided by the patient.  Emesis   Past Medical History:  Diagnosis Date   Abnormal behavior    Homelessness    Schizophrenia [facility])     Patient Active Problem List   Diagnosis Date Noted   Schizophrenia, paranoid type (HCC) 07/18/2015   Acute psychosis (HCC)    Mild cognitive disorder 02/01/2015   Hx of epistaxis 02/01/2015   Vitamin B12 deficiency 01/22/2015   TSH deficiency 01/22/2015   No significant past medical history     History reviewed. No pertinent surgical history.     Home Medications    Prior to Admission medications   Medication Sig Start Date End Date Taking? Authorizing Provider  albuterol  (VENTOLIN  HFA) 108 (90 Base) MCG/ACT inhaler Inhale 2 puffs into the lungs every 6 (six) hours as needed for wheezing or shortness of breath. 07/25/21  Yes Tanda Bleacher, MD  ondansetron  (ZOFRAN -ODT) 8 MG disintegrating tablet Take 1 tablet (8 mg total) by mouth every 8 (eight) hours as needed for nausea or vomiting. 11/25/23  Yes Andra Krabbe C, PA-C  benztropine  (COGENTIN ) 1 MG tablet Take 1 tablet (1 mg total) by mouth 2 (two) times daily. 03/25/18   Malinda Rogue, MD  citalopram  (CELEXA ) 20 MG tablet Take 1 tablet (20 mg total) by mouth daily. 03/26/18   Malinda Rogue, MD  diphenoxylate -atropine  (LOMOTIL ) 2.5-0.025 MG tablet Take 1 tablet by mouth 4 (four) times daily as needed for diarrhea or loose stools. 10/03/21   Banister, Pamela K, MD  Divalproex Sodium (DEPAKOTE PO) Take by  mouth.    [provider]  ibuprofen  (ADVIL ) 800 MG tablet Take 1 tablet (800 mg total) by mouth 3 (three) times daily. 11/24/22   Dreama, [place]  N, FNP  omeprazole  (PRILOSEC) 40 MG capsule Take 1 capsule (40 mg total) by mouth daily. 07/25/21   Tanda Bleacher, MD  oxyCODONE -acetaminophen  (PERCOCET/ROXICET) 5-325 MG tablet Take 1 tablet by mouth every 6 (six) hours as needed for severe pain. 07/23/22   Zelaya, Oscar A, PA-C  Paliperidone  Palmitate (INVEGA  SUSTENNA IM) Inject 1 Dose into the muscle every 6 (six) weeks.    [provider]    Family History Family History  Problem Relation Age of Onset   Depression Father    Bipolar disorder Father    Alcohol abuse Father     Social History Social History   Tobacco Use   Smoking status: Never   Smokeless tobacco: Never  Vaping Use   Vaping status: Never Used  Substance Use Topics   Alcohol use: No   Drug use: No     Allergies   Bee venom and Other   Review of Systems Review of Systems  Gastrointestinal:  Positive for vomiting.     Physical Exam Triage Vital Signs ED Triage Vitals  Encounter Vitals Group     BP 11/25/23 1441 113/78     Girls Systolic BP Percentile --  Girls Diastolic BP Percentile --      Boys Systolic BP Percentile --      Boys Diastolic BP Percentile --      Pulse Rate 11/25/23 1441 (!) 101     Resp 11/25/23 1441 18     Temp 11/25/23 1441 98.1 F (36.7 C)     Temp src --      SpO2 11/25/23 1441 95 %     Weight --      Height --      Head Circumference --      Peak Flow --      Pain Score 11/25/23 1436 7     Pain Loc --      Pain Education --      Exclude from Growth Chart --    No data found.  Updated Vital Signs BP 113/78   Pulse (!) 101   Temp 98.1 F (36.7 C)   Resp 18   SpO2 95%   Visual Acuity Right Eye Distance:   Left Eye Distance:   Bilateral Distance:    Right Eye Near:   Left Eye Near:    Bilateral Near:     Physical Exam Vitals and  nursing note reviewed.  Constitutional:      General: He is not in acute distress.    Appearance: Normal appearance. He is not ill-appearing, toxic-appearing or diaphoretic.  Eyes:     General: No scleral icterus. Cardiovascular:     Rate and Rhythm: Normal rate and regular rhythm.     Heart sounds: Normal heart sounds.  Pulmonary:     Effort: Pulmonary effort is normal. No respiratory distress.     Breath sounds: Normal breath sounds. No wheezing or rhonchi.  Abdominal:     General: Abdomen is flat. Bowel sounds are normal.     Palpations: Abdomen is soft.     Tenderness: There is generalized abdominal tenderness. There is no right CVA tenderness or left CVA tenderness.  Skin:    General: Skin is warm.  Neurological:     Mental Status: He is alert and oriented to person, place, and time.  Psychiatric:        Mood and Affect: Mood normal.        Behavior: Behavior normal.      UC Treatments / Results  Labs (all labs ordered are listed, but only abnormal results are displayed) Labs Reviewed - No data to display  EKG   Radiology No results found.  Procedures Procedures (including critical care time)  Medications Ordered in UC Medications  ondansetron  (ZOFRAN -ODT) disintegrating tablet 8 mg (8 mg Oral Given 11/25/23 1534)    Initial Impression / Assessment and Plan / UC Course  I have reviewed the triage vital signs and the nursing notes.  Pertinent labs & imaging results that were available during my care of the patient were reviewed by me and considered in my medical decision making (see chart for details).     Diagnosed pt with acute gastroenteritis and advised BRAT diet. Pt was prescribed Zofran  ODT 8mg .  Final Clinical Impressions(s) / UC Diagnoses   Final diagnoses:  Acute gastroenteritis     Discharge Instructions      You have been diagnosed with a gastrointestinal issue today with symptoms of nausea, vomiting, and/or diarrhea.  It is best that you  eat a bland diet which includes dry toast, applesauce, bananas, chicken broth, plain rice, or plain mashed potatoes eat these things with no salt-and-pepper, excessive  butter, or other seasonings.  It is also important to drink plenty of fluids as you are losing a lot of electrolytes due to vomiting and having diarrhea.  Diluted Gatorade, water , Pedialyte, liquid IV, etc. are good choices for fluid intake.  You may also use popsicles and/or Italian ice.  It is recommended that you do not use anything to stop your diarrhea as it is your body's way of getting rid of the offending bug.  If your symptoms are not improving within 3 to 5 days, you are experiencing significant fatigue, or you are urinating less frequently you will need to follow-up with your PCP or report to the ER.     ED Prescriptions     Medication Sig Dispense Auth. Provider   ondansetron  (ZOFRAN -ODT) 8 MG disintegrating tablet Take 1 tablet (8 mg total) by mouth every 8 (eight) hours as needed for nausea or vomiting. 20 tablet Andra Corean BROCKS, PA-C      PDMP not reviewed this encounter.   Andra Corean BROCKS, PA-C 11/25/23 [phone]

## 2023-12-10 ENCOUNTER — Ambulatory Visit (HOSPITAL_COMMUNITY)
Admission: EM | Admit: 2023-12-10 | Discharge: 2023-12-10 | Disposition: A | Discharge status: Home / Self Care | Attending: Emergency Medicine | Admitting: Emergency Medicine

## 2023-12-10 ENCOUNTER — Encounter (HOSPITAL_COMMUNITY): Payer: Self-pay

## 2023-12-10 DIAGNOSIS — R197 Diarrhea, unspecified: Secondary | ICD-10-CM | POA: Diagnosis not present

## 2023-12-10 MED ORDER — LOPERAMIDE HCL 2 MG PO CAPS: 2.0000 mg | ORAL_CAPSULE | Freq: Four times a day (QID) | ORAL | 0 refills | Status: DC | PRN

## 2023-12-10 NOTE — ED Triage Notes (Signed)
 Patient having diarrhea onset 3 days ago. No one else with similar symptoms. Denies any new foods or meds. Denies fever, nausea, or emesis.   Tried pepto-bismol with no relief. States he was seen here for diarrhea before and wanting the same meds from that visit as they helped.

## 2023-12-10 NOTE — Discharge Instructions (Addendum)
 Use the Imodium  up to 4 times daily as needed for diarrhea or loose stools.  Ensure you are staying well-hydrated with at least 64 ounces of water , consider adding Pedialyte or Gatorade to replace some electrolytes.  Follow a bland diet such as bananas, toast, rice, applesauce and boiled chicken to help avoid any further GI upset.  Symptoms should improve over the next few days with Imodium  and bland diet.  If no improvement or any changes please return to clinic for reevaluation.

## 2023-12-10 NOTE — ED Provider Notes (Signed)
 [facility]    CSN: [id number] Arrival date & time: 12/10/23  0932      History   Chief Complaint Chief Complaint  Patient presents with   Diarrhea    HPI Justin Cumbie. is a 33 y.o. male.   Patient presents to clinic over concern of diarrhea for the past 3 days.  He is unsure what caused this.  He makes food at home and then when at work he eats out, thinks it could be some of the food he had while he was at work eating out.  Did try Pepto-Bismol without relief.  Has not had nausea or vomiting.  Denies fevers.  Stool is watery and brown, denies blood.  Mild abdominal discomfort and cramping.  No recent sick contacts.  The history is provided by medical records and the patient.  Diarrhea   Past Medical History:  Diagnosis Date   Abnormal behavior    Homelessness    Schizophrenia [facility])     Patient Active Problem List   Diagnosis Date Noted   Schizophrenia, paranoid type (HCC) 07/18/2015   Acute psychosis (HCC)    Mild cognitive disorder 02/01/2015   Hx of epistaxis 02/01/2015   Vitamin B12 deficiency 01/22/2015   TSH deficiency 01/22/2015   No significant past medical history     History reviewed. No pertinent surgical history.     Home Medications    Prior to Admission medications   Medication Sig Start Date End Date Taking? Authorizing Provider  benztropine  (COGENTIN ) 1 MG tablet Take 1 tablet (1 mg total) by mouth 2 (two) times daily. 03/25/18  Yes Malinda Rogue, MD  citalopram  (CELEXA ) 20 MG tablet Take 1 tablet (20 mg total) by mouth daily. 03/26/18  Yes Malinda Rogue, MD  loperamide  (IMODIUM ) 2 MG capsule Take 1 capsule (2 mg total) by mouth 4 (four) times daily as needed for diarrhea or loose stools. 12/10/23  Yes Harriet Bollen  N, FNP  Paliperidone  Palmitate (INVEGA  SUSTENNA IM) Inject 1 Dose into the muscle every 6 (six) weeks.   Yes [provider]  albuterol  (VENTOLIN  HFA) 108 (90 Base) MCG/ACT inhaler Inhale 2  puffs into the lungs every 6 (six) hours as needed for wheezing or shortness of breath. 07/25/21   Tanda Bleacher, MD  diphenoxylate -atropine  (LOMOTIL ) 2.5-0.025 MG tablet Take 1 tablet by mouth 4 (four) times daily as needed for diarrhea or loose stools. 10/03/21   Banister, Pamela K, MD  Divalproex Sodium (DEPAKOTE PO) Take by mouth.    [provider]  ibuprofen  (ADVIL ) 800 MG tablet Take 1 tablet (800 mg total) by mouth 3 (three) times daily. 11/24/22   Dreama, Cheyla Duchemin  N, FNP  omeprazole  (PRILOSEC) 40 MG capsule Take 1 capsule (40 mg total) by mouth daily. 07/25/21   Tanda Bleacher, MD  ondansetron  (ZOFRAN -ODT) 8 MG disintegrating tablet Take 1 tablet (8 mg total) by mouth every 8 (eight) hours as needed for nausea or vomiting. 11/25/23   Peterson, Stephanie C, PA-C  oxyCODONE -acetaminophen  (PERCOCET/ROXICET) 5-325 MG tablet Take 1 tablet by mouth every 6 (six) hours as needed for severe pain. 07/23/22   Zelaya, Oscar A, PA-C    Family History Family History  Problem Relation Age of Onset   Depression Father    Bipolar disorder Father    Alcohol abuse Father     Social History Social History   Tobacco Use   Smoking status: Never   Smokeless tobacco: Never  Vaping Use   Vaping status: Never  Used  Substance Use Topics   Alcohol use: No   Drug use: No     Allergies   Bee venom and Other   Review of Systems Review of Systems  Per HPI  Physical Exam Triage Vital Signs ED Triage Vitals  Encounter Vitals Group     BP 12/10/23 0956 117/75     Girls Systolic BP Percentile --      Girls Diastolic BP Percentile --      Boys Systolic BP Percentile --      Boys Diastolic BP Percentile --      Pulse Rate 12/10/23 0956 94     Resp 12/10/23 0956 18     Temp 12/10/23 0956 (!) 97.5 F (36.4 C)     Temp Source 12/10/23 0956 Oral     SpO2 12/10/23 0956 96 %     Weight 12/10/23 0956 200 lb (90.7 kg)     Height 12/10/23 0956 5' 5 (1.651 m)     Head Circumference --       Peak Flow --      Pain Score 12/10/23 0955 7     Pain Loc --      Pain Education --      Exclude from Growth Chart --    No data found.  Updated Vital Signs BP 117/75 (BP Location: Left Arm)   Pulse 94   Temp (!) 97.5 F (36.4 C) (Oral)   Resp 18   Ht 5' 5 (1.651 m)   Wt 200 lb (90.7 kg)   SpO2 96%   BMI 33.28 kg/m   Visual Acuity Right Eye Distance:   Left Eye Distance:   Bilateral Distance:    Right Eye Near:   Left Eye Near:    Bilateral Near:     Physical Exam Vitals and nursing note reviewed.  Constitutional:      Appearance: Normal appearance.  HENT:     Head: Normocephalic and atraumatic.     Right Ear: External ear normal.     Left Ear: External ear normal.     Nose: Nose normal.     Mouth/Throat:     Mouth: Mucous membranes are moist.  Eyes:     Conjunctiva/sclera: Conjunctivae normal.  Cardiovascular:     Rate and Rhythm: Normal rate.  Pulmonary:     Effort: Pulmonary effort is normal. No respiratory distress.  Abdominal:     General: Abdomen is flat. Bowel sounds are normal.     Palpations: Abdomen is soft.     Tenderness: There is abdominal tenderness. There is no guarding or rebound.  Skin:    General: Skin is warm and dry.  Neurological:     General: No focal deficit present.     Mental Status: He is alert and oriented to person, place, and time.  Psychiatric:        Mood and Affect: Mood normal.        Behavior: Behavior normal.      UC Treatments / Results  Labs (all labs ordered are listed, but only abnormal results are displayed) Labs Reviewed - No data to display  EKG   Radiology No results found.  Procedures Procedures (including critical care time)  Medications Ordered in UC Medications - No data to display  Initial Impression / Assessment and Plan / UC Course  I have reviewed the triage vital signs and the nursing notes.  Pertinent labs & imaging results that were available during my care of the  patient were  reviewed by me and considered in my medical decision making (see chart for details).  Vitals in triage reviewed, patient is hemodynamically stable.  Abdomen is soft with generalized tenderness, active bowel sounds.  Afebrile without tachycardia, tolerating p.o. at this time.  Symptomatic management for diarrhea with DM and hydration.  Does not appear to be bacterial at this time.  Plan of care, follow-up care return precautions given, no questions at this time.  Work note provided.    Final Clinical Impressions(s) / UC Diagnoses   Final diagnoses:  Diarrhea, unspecified type     Discharge Instructions      Use the Imodium  up to 4 times daily as needed for diarrhea or loose stools.  Ensure you are staying well-hydrated with at least 64 ounces of water , consider adding Pedialyte or Gatorade to replace some electrolytes.  Follow a bland diet such as bananas, toast, rice, applesauce and boiled chicken to help avoid any further GI upset.  Symptoms should improve over the next few days with Imodium  and bland diet.  If no improvement or any changes please return to clinic for reevaluation.     ED Prescriptions     Medication Sig Dispense Auth. Provider   loperamide  (IMODIUM ) 2 MG capsule Take 1 capsule (2 mg total) by mouth 4 (four) times daily as needed for diarrhea or loose stools. 12 capsule Dreama, Lucianna Ostlund  N, FNP      PDMP not reviewed this encounter.   Dreama Marveen SAILOR, FNP 12/10/23 1024

## 2023-12-12 ENCOUNTER — Encounter (HOSPITAL_COMMUNITY): Payer: Self-pay

## 2023-12-12 ENCOUNTER — Ambulatory Visit (HOSPITAL_COMMUNITY)
Admission: EM | Admit: 2023-12-12 | Discharge: 2023-12-12 | Disposition: A | Discharge status: Home / Self Care | Attending: Family Medicine | Admitting: Family Medicine

## 2023-12-12 DIAGNOSIS — R197 Diarrhea, unspecified: Secondary | ICD-10-CM

## 2023-12-12 DIAGNOSIS — R1084 Generalized abdominal pain: Secondary | ICD-10-CM

## 2023-12-12 MED ORDER — DIPHENOXYLATE-ATROPINE 2.5-0.025 MG PO TABS: 1.0000 | ORAL_TABLET | Freq: Four times a day (QID) | ORAL | 0 refills | Status: AC | PRN

## 2023-12-12 NOTE — ED Provider Notes (Signed)
 [facility]    CSN: [id number] Arrival date & time: 12/12/23  1225      History   Chief Complaint Chief Complaint  Patient presents with   Diarrhea    HPI Justin Dino Earl Tunney Jr. is a 33 y.o. male.    Diarrhea Associated symptoms: abdominal pain   Patient is here for diarrhea for about 5 days.  He was seen several days ago for this.  He was given imodium , but states that was not helpful.  He is having stomach pain, 7/10, and feels bloated.  He gets diarrhea after eating anything.   Starts about 3am, and and going every 3-4 hours.  No blood in the stool. No black, tarry stools.  Nothing eaten out of the ordinary.        Past Medical History:  Diagnosis Date   Abnormal behavior    Homelessness    Schizophrenia [facility])     Patient Active Problem List   Diagnosis Date Noted   Schizophrenia, paranoid type (HCC) 07/18/2015   Acute psychosis (HCC)    Mild cognitive disorder 02/01/2015   Hx of epistaxis 02/01/2015   Vitamin B12 deficiency 01/22/2015   TSH deficiency 01/22/2015   No significant past medical history     History reviewed. No pertinent surgical history.     Home Medications    Prior to Admission medications   Medication Sig Start Date End Date Taking? Authorizing Provider  albuterol  (VENTOLIN  HFA) 108 (90 Base) MCG/ACT inhaler Inhale 2 puffs into the lungs every 6 (six) hours as needed for wheezing or shortness of breath. 07/25/21   Tanda Bleacher, MD  benztropine  (COGENTIN ) 1 MG tablet Take 1 tablet (1 mg total) by mouth 2 (two) times daily. 03/25/18   Malinda Rogue, MD  citalopram  (CELEXA ) 20 MG tablet Take 1 tablet (20 mg total) by mouth daily. 03/26/18   Malinda Rogue, MD  diphenoxylate -atropine  (LOMOTIL ) 2.5-0.025 MG tablet Take 1 tablet by mouth 4 (four) times daily as needed for diarrhea or loose stools. 10/03/21   Banister, Pamela K, MD  Divalproex Sodium (DEPAKOTE PO) Take by mouth.    [provider]  ibuprofen  (ADVIL ) 800  MG tablet Take 1 tablet (800 mg total) by mouth 3 (three) times daily. 11/24/22   Dreama, [place]  N, FNP  loperamide  (IMODIUM ) 2 MG capsule Take 1 capsule (2 mg total) by mouth 4 (four) times daily as needed for diarrhea or loose stools. 12/10/23   Dreama, [place]  N, FNP  omeprazole  (PRILOSEC) 40 MG capsule Take 1 capsule (40 mg total) by mouth daily. 07/25/21   Tanda Bleacher, MD  ondansetron  (ZOFRAN -ODT) 8 MG disintegrating tablet Take 1 tablet (8 mg total) by mouth every 8 (eight) hours as needed for nausea or vomiting. 11/25/23   Peterson, Stephanie C, PA-C  oxyCODONE -acetaminophen  (PERCOCET/ROXICET) 5-325 MG tablet Take 1 tablet by mouth every 6 (six) hours as needed for severe pain. Patient not taking: Reported on 12/12/2023 07/23/22   Zelaya, Oscar A, PA-C  Paliperidone  Palmitate (INVEGA  SUSTENNA IM) Inject 1 Dose into the muscle every 6 (six) weeks.    [provider]    Family History Family History  Problem Relation Age of Onset   Depression Father    Bipolar disorder Father    Alcohol abuse Father     Social History Social History   Tobacco Use   Smoking status: Never   Smokeless tobacco: Never  Vaping Use   Vaping status: Never Used  Substance Use Topics  Alcohol use: No   Drug use: No     Allergies   Bee venom and Other   Review of Systems Review of Systems  Constitutional: Negative.   HENT: Negative.    Respiratory: Negative.    Cardiovascular: Negative.   Gastrointestinal:  Positive for abdominal pain and diarrhea.  Genitourinary: Negative.   Musculoskeletal: Negative.   Psychiatric/Behavioral: Negative.       Physical Exam Triage Vital Signs ED Triage Vitals [12/12/23 1315]  Encounter Vitals Group     BP 118/77     Girls Systolic BP Percentile      Girls Diastolic BP Percentile      Boys Systolic BP Percentile      Boys Diastolic BP Percentile      Pulse Rate 82     Resp 16     Temp 98.3 F (36.8 C)     Temp Source Oral      SpO2 96 %     Weight      Height      Head Circumference      Peak Flow      Pain Score 7     Pain Loc      Pain Education      Exclude from Growth Chart    No data found.  Updated Vital Signs BP 118/77 (BP Location: Right Arm)   Pulse 82   Temp 98.3 F (36.8 C) (Oral)   Resp 16   SpO2 96%   Visual Acuity Right Eye Distance:   Left Eye Distance:   Bilateral Distance:    Right Eye Near:   Left Eye Near:    Bilateral Near:     Physical Exam Constitutional:      General: He is not in acute distress.    Appearance: Normal appearance. He is normal weight. He is not ill-appearing or toxic-appearing.  Cardiovascular:     Rate and Rhythm: Normal rate and regular rhythm.  Pulmonary:     Effort: Pulmonary effort is normal.     Breath sounds: Normal breath sounds.  Abdominal:     General: Bowel sounds are increased.     Palpations: Abdomen is soft.     Tenderness: There is abdominal tenderness. There is no right CVA tenderness, left CVA tenderness, guarding or rebound.  Skin:    General: Skin is warm.  Neurological:     General: No focal deficit present.     Mental Status: He is alert.  Psychiatric:        Mood and Affect: Mood normal.      UC Treatments / Results  Labs (all labs ordered are listed, but only abnormal results are displayed) Labs Reviewed - No data to display  EKG   Radiology No results found.  Procedures Procedures (including critical care time)  Medications Ordered in UC Medications - No data to display  Initial Impression / Assessment and Plan / UC Course  I have reviewed the triage vital signs and the nursing notes.  Pertinent labs & imaging results that were available during my care of the patient were reviewed by me and considered in my medical decision making (see chart for details).   Final Clinical Impressions(s) / UC Diagnoses   Final diagnoses:  Diarrhea, unspecified type  Generalized abdominal pain     Discharge  Instructions      You were seen today for diarrhea and abdominal pain.  I have sent out a different medication for diarrhea today.  Please  eat bland foods.  If you have continued symptoms, or worsening pain, then please go to the Emergency Room for further evaluation.     ED Prescriptions     Medication Sig Dispense Auth. Provider   diphenoxylate -atropine  (LOMOTIL ) 2.5-0.025 MG tablet Take 1 tablet by mouth 4 (four) times daily as needed for diarrhea or loose stools. 8 tablet Darral Longs, MD      PDMP not reviewed this encounter.   Darral Longs, MD 12/12/23 1351

## 2023-12-12 NOTE — Discharge Instructions (Signed)
 You were seen today for diarrhea and abdominal pain.  I have sent out a different medication for diarrhea today.  Please eat bland foods.  If you have continued symptoms, or worsening pain, then please go to the Emergency Room for further evaluation.

## 2023-12-12 NOTE — ED Triage Notes (Signed)
 Patient reports that he was seen on 12/10/23 for diarrhea. Patient states he has been taking the medication prescribed, but is still having diarrhea and it usually begins around 0300 and continues every 2-3 hours. Patient states he continues to have abdominal pain as well.

## 2024-01-02 ENCOUNTER — Ambulatory Visit (HOSPITAL_COMMUNITY)
Admission: EM | Admit: 2024-01-02 | Discharge: 2024-01-02 | Disposition: A | Discharge status: Home / Self Care | Attending: Emergency Medicine | Admitting: Emergency Medicine

## 2024-01-02 ENCOUNTER — Encounter (HOSPITAL_COMMUNITY): Payer: Self-pay | Admitting: Emergency Medicine

## 2024-01-02 DIAGNOSIS — R6889 Other general symptoms and signs: Secondary | ICD-10-CM | POA: Diagnosis present

## 2024-01-02 DIAGNOSIS — J069 Acute upper respiratory infection, unspecified: Secondary | ICD-10-CM | POA: Diagnosis present

## 2024-01-02 LAB — POC COVID19/FLU A&B COMBO
Covid Antigen, POC: NEGATIVE
Influenza A Antigen, POC: NEGATIVE
Influenza B Antigen, POC: NEGATIVE

## 2024-01-02 LAB — POCT RAPID STREP A (OFFICE): Rapid Strep A Screen: NEGATIVE

## 2024-01-02 MED ORDER — GUAIFENESIN 400 MG PO TABS: ORAL_TABLET | ORAL | 0 refills | Status: DC

## 2024-01-02 MED ORDER — ALBUTEROL SULFATE HFA 108 (90 BASE) MCG/ACT IN AERS: 2.0000 | INHALATION_SPRAY | Freq: Four times a day (QID) | RESPIRATORY_TRACT | 2 refills | Status: AC | PRN

## 2024-01-02 MED ORDER — PROMETHAZINE-DM 6.25-15 MG/5ML PO SYRP: 5.0000 mL | ORAL_SOLUTION | Freq: Every evening | ORAL | 0 refills | Status: DC | PRN

## 2024-01-02 MED ORDER — IBUPROFEN 400 MG PO TABS: 400.0000 mg | ORAL_TABLET | Freq: Three times a day (TID) | ORAL | 0 refills | Status: DC | PRN

## 2024-01-02 MED ORDER — ONDANSETRON 4 MG PO TBDP: 4.0000 mg | ORAL_TABLET | Freq: Three times a day (TID) | ORAL | 0 refills | Status: DC | PRN

## 2024-01-02 MED ORDER — ONDANSETRON 4 MG PO TBDP
ORAL_TABLET | ORAL | Status: AC
  Filled 2024-01-02: qty 1

## 2024-01-02 MED ORDER — ONDANSETRON 4 MG PO TBDP
4.0000 mg | ORAL_TABLET | Freq: Once | ORAL | Status: AC
  Administered 2024-01-02: 4 mg via ORAL

## 2024-01-02 NOTE — ED Provider Notes (Signed)
 [facility]    CSN: [id number] Arrival date & time: 01/02/24  1220    HISTORY   Chief Complaint  Patient presents with   Cough   Headache   HPI Justin [name]. is a pleasant, 33 y.o. male who presents to urgent care today. Patient complains of acute onset headache, sore throat, nasal congestion, rhinorrhea, nonproductive cough, body aches, chills, nausea, vomiting.  Patient denies diarrhea.  Patient endorses multiple sick contacts at work.  Vital signs on arrival reveal an oral temperature of 99.2 that is likely false due to patient's skin feeling much warmer during physical exam, heart rate of 107 and SpO2 of 95%.  Patient denies history of allergies or asthma, shortness of breath.  Per EMR, patient was prescribed albuterol  for diagnosis of mild intermittent asthma in 2023.  Patient seems to be unaware of this.  The history is provided by the patient.  Cough Associated symptoms: headaches   Headache Associated symptoms: cough    Past Medical History:  Diagnosis Date   Abnormal behavior    Homelessness    Schizophrenia [facility])    Patient Active Problem List   Diagnosis Date Noted   Schizophrenia, paranoid type (HCC) 07/18/2015   Acute psychosis (HCC)    Mild cognitive disorder 02/01/2015   Hx of epistaxis 02/01/2015   Vitamin B12 deficiency 01/22/2015   TSH deficiency 01/22/2015   No significant past medical history    History reviewed. No pertinent surgical history.  Home Medications    Prior to Admission medications   Medication Sig Start Date End Date Taking? Authorizing Provider  albuterol  (VENTOLIN  HFA) 108 (90 Base) MCG/ACT inhaler Inhale 2 puffs into the lungs every 6 (six) hours as needed for wheezing or shortness of breath. 07/25/21   Tanda Bleacher, MD  benztropine  (COGENTIN ) 1 MG tablet Take 1 tablet (1 mg total) by mouth 2 (two) times daily. 03/25/18   Malinda Rogue, MD  citalopram  (CELEXA ) 20 MG tablet Take 1 tablet (20 mg total) by  mouth daily. 03/26/18   Malinda Rogue, MD  diphenoxylate -atropine  (LOMOTIL ) 2.5-0.025 MG tablet Take 1 tablet by mouth 4 (four) times daily as needed for diarrhea or loose stools. 12/12/23   Piontek, Rocky, MD  Divalproex Sodium (DEPAKOTE PO) Take by mouth.    [provider]  ibuprofen  (ADVIL ) 800 MG tablet Take 1 tablet (800 mg total) by mouth 3 (three) times daily. 11/24/22   Dreama, [place]  N, FNP  loperamide  (IMODIUM ) 2 MG capsule Take 1 capsule (2 mg total) by mouth 4 (four) times daily as needed for diarrhea or loose stools. 12/10/23   Dreama, [place]  N, FNP  omeprazole  (PRILOSEC) 40 MG capsule Take 1 capsule (40 mg total) by mouth daily. 07/25/21   Tanda Bleacher, MD  Paliperidone  Palmitate (INVEGA  SUSTENNA IM) Inject 1 Dose into the muscle every 6 (six) weeks.    [provider]    Family History Family History  Problem Relation Age of Onset   Depression Father    Bipolar disorder Father    Alcohol abuse Father    Social History Social History   Tobacco Use   Smoking status: Never   Smokeless tobacco: Never  Vaping Use   Vaping status: Never Used  Substance Use Topics   Alcohol use: No   Drug use: No   Allergies   Bee venom and Other  Review of Systems Review of Systems  Respiratory:  Positive for cough.   Neurological:  Positive for headaches.  Pertinent findings revealed after performing a 14 point review of systems has been noted in the history of present illness.  Physical Exam Vital Signs BP 127/78 (BP Location: Right Arm)   Pulse (!) 107   Temp 99.2 F (37.3 C)   Resp 17   SpO2 95%   No data found.  Physical Exam Constitutional:      Appearance: He is ill-appearing.  HENT:     Head: Normocephalic and atraumatic.     Salivary Glands: Right salivary gland is not diffusely enlarged or tender. Left salivary gland is not diffusely enlarged or tender.     Right Ear: Tympanic membrane, ear canal and external ear normal.     Left Ear:  Tympanic membrane, ear canal and external ear normal.     Nose: Congestion and rhinorrhea present. Rhinorrhea is clear.     Right Sinus: No maxillary sinus tenderness or frontal sinus tenderness.     Left Sinus: No maxillary sinus tenderness.     Mouth/Throat:     Mouth: Mucous membranes are moist.     Pharynx: Pharyngeal swelling, posterior oropharyngeal erythema and uvula swelling present.     Tonsils: No tonsillar exudate. 0 on the right. 0 on the left.  Cardiovascular:     Rate and Rhythm: Normal rate and regular rhythm.     Pulses: Normal pulses.  Pulmonary:     Effort: Pulmonary effort is normal. No accessory muscle usage, prolonged expiration or respiratory distress.     Breath sounds: No stridor. No wheezing, rhonchi or rales.     Comments: Turbulent breath sounds throughout without wheeze, rale, rhonchi. Abdominal:     General: Abdomen is flat. Bowel sounds are normal.     Palpations: Abdomen is soft.  Musculoskeletal:        General: Normal range of motion.  Lymphadenopathy:     Cervical: Cervical adenopathy present.     Right cervical: Superficial cervical adenopathy and posterior cervical adenopathy present.     Left cervical: Superficial cervical adenopathy and posterior cervical adenopathy present.  Skin:    General: Skin is warm and dry.  Neurological:     General: No focal deficit present.     Mental Status: He is alert and oriented to person, place, and time.     Motor: Motor function is intact.     Coordination: Coordination is intact.     Gait: Gait is intact.     Deep Tendon Reflexes: Reflexes are normal and symmetric.  Psychiatric:        Attention and Perception: Attention and perception normal.        Mood and Affect: Mood and affect normal.        Speech: Speech normal.        Behavior: Behavior normal. Behavior is cooperative.        Thought Content: Thought content normal.     Visual Acuity Right Eye Distance:   Left Eye Distance:   Bilateral  Distance:    Right Eye Near:   Left Eye Near:    Bilateral Near:     UC Couse / Diagnostics / Procedures:     Radiology No results found.  Procedures Procedures (including critical care time) EKG  Pending results:  Labs Reviewed  CULTURE, GROUP A STREP [facility])  POC COVID19/FLU A&B COMBO  POCT RAPID STREP A (OFFICE)    Medications Ordered in UC: Medications  ondansetron  (ZOFRAN -ODT) disintegrating tablet 4 mg (4 mg Oral Given 01/02/24 1422)  UC Diagnoses / Final Clinical Impressions(s)   I have reviewed the triage vital signs and the nursing notes.  Pertinent labs & imaging results that were available during my care of the patient were reviewed by me and considered in my medical decision making (see chart for details).    Final diagnoses:  Viral URI with cough  Feeling unwell   Patient advised of negative COVID-19, influenza and rapid strep test today.  Streptococcal throat culture will be performed, will treat for strep as needed based on throat culture result.  Patient advised patient is likely dealing with a viral URI at this time.  Supportive medications discussed.  Conservative care recommended.  Return precautions advised.  Please see discharge instructions below for details of plan of care as provided to patient. ED Prescriptions     Medication Sig Dispense Auth. Provider   ondansetron  (ZOFRAN -ODT) 4 MG disintegrating tablet Take 1 tablet (4 mg total) by mouth every 8 (eight) hours as needed for nausea or vomiting. 20 tablet Joesph Shaver Scales, PA-C   ibuprofen  (ADVIL ) 400 MG tablet Take 1 tablet (400 mg total) by mouth every 8 (eight) hours as needed for up to 30 doses. 30 tablet Joesph Shaver Scales, PA-C   guaifenesin (HUMIBID E) 400 MG TABS tablet Take 1 tablet 3 times daily as needed for chest congestion and cough 30 tablet Joesph Shaver Scales, PA-C   promethazine-dextromethorphan (PROMETHAZINE-DM) 6.25-15 MG/5ML syrup Take 5 mLs by mouth at bedtime  as needed for cough. 60 mL Joesph Shaver Scales, PA-C   albuterol  (VENTOLIN  HFA) 108 (90 Base) MCG/ACT inhaler Inhale 2 puffs into the lungs every 6 (six) hours as needed for wheezing or shortness of breath (Cough). 18 g Joesph Shaver Scales, PA-C      PDMP not reviewed this encounter.    Discharge Instructions      Your rapid influenza and COVID-19 antigen test today was negative.  No further influenza testing is indicated.  Please consider retesting for COVID-19 in the next 2 to 3 days, particularly if you are not feeling any better.  You are welcome to return here to urgent care to repeat the test or you can take a home COVID-19 test.     If both your COVID-19 tests are negative, then you can safely assume that your illness is due to one of the many less serious illnesses circulating in our community right now.     Your strep test today is negative.  Streptococcal throat culture will be performed per our protocol.  The result of your throat culture will be posted to your MyChart once it is complete, this typically takes 3 to 5 days.  If your streptococcal throat culture is positive, you will be contacted by phone and antibiotics will prescribed for you.   Conservative care is recommended with rest, drinking plenty of clear fluids, eating only when hungry, taking supportive medications for your symptoms and avoiding being around other people.  Please remain at home until you are fever free for 24 hours without the use of antifever medications such as Tylenol  and ibuprofen .   Please read below to learn more about the medications, dosages and frequencies that I recommend to help alleviate your symptoms and to get you feeling better soon:   Advil , Motrin  (ibuprofen ): This is a good anti-inflammatory medication which addresses aches, pains and inflammation of the upper airways that causes sinus and nasal congestion as well as in the lower airways which makes your cough feel tight and  sometimes burn.  I recommend that you take  400 mg every 6-8 hours as needed.  Please do not take more than 2400 mg of ibuprofen  in a 24-hour period and please do not take high doses of ibuprofen  for more than 3 days in a row as this can lead to stomach ulcers.  ProAir , Ventolin , Proventil  (albuterol ): This inhaled medication contains a short acting beta agonist bronchodilator.  This medication works on the smooth muscle that opens and constricts of your airways by relaxing the muscle.  The result of relaxation of the smooth muscle is increased air movement and improved work of breathing.  This is a short acting medication that can be used every 4-6 hours as needed for increased work of breathing, shortness of breath, wheezing and excessive coughing.     Robitussin, Mucinex (guaifenesin): This is a daytime expectorant.  This single symptom reliever helps break up chest congestion and loosen up thick nasal drainage making phlegm and drainage easier to cough up and to blow out from your nose.  I recommend taking 400 mg in either liquid or tablet form three times daily as needed.  I do not recommend the 12-hour extended relief version or doses higher than 400 mg per each dose as these often make some patients feel jittery or jumpy and can interfere with sleep.  I also do not recommend that you purchase guaifenesin with the ingredient  DM which is dextromethorphan, a cough suppressant which I only recommend taking at bedtime.  Guaifenesin 400 mg is a safe dose for people who are being treated for high blood pressure.     Promethazine DM: Promethazine is both a nasal decongestant that dries up mucous membranes and an antinausea medication.  Promethazine often makes most patients feel fairly sleepy.  DM is dextromethorphan, a single symptom reliever which is a cough suppressant found in many over-the-counter cough medications and combination cold preparations.  Please take 5 mL before bedtime to minimize your  cough which will help you sleep better.  I have sent a prescription for this medication to your pharmacy because it cannot be purchased over-the-counter.   Zofran  (ondansetron ): This is a good antinausea medication that you can take every 8 hours as needed for symptoms of nausea and vomiting.  It is a dissolvable tablet that you do not need to take with water , just put it under your tongue and let it melt away.  I have sent a prescription to your pharmacy.   If symptoms have not meaningfully improved in the next 3 to 5 days, please return for repeat evaluation or follow-up with your regular provider.  If symptoms have worsened in the next 3 to 5 days, please go to the emergency room for further evaluation.    Thank you for visiting urgent care today.  We appreciate the opportunity to participate in your care.       Disposition Upon Discharge:  Condition: stable for discharge home  Patient presented with an acute illness with associated systemic symptoms and significant discomfort requiring urgent management. In my opinion, this is a condition that a prudent lay person (someone who possesses an average knowledge of health and medicine) may potentially expect to result in complications if not addressed urgently such as respiratory distress, impairment of bodily function or dysfunction of bodily organs.   Routine symptom specific, illness specific and/or disease specific instructions were discussed with the patient and/or caregiver at length.   As such, the patient has been evaluated and assessed,  work-up was performed and treatment was provided in alignment with urgent care protocols and evidence based medicine.  Patient/parent/caregiver has been advised that the patient may require follow up for further testing and treatment if the symptoms continue in spite of treatment, as clinically indicated and appropriate.  Patient/parent/caregiver has been advised to return to the [facility] or PCP if no better;  to PCP or the Emergency Department if new signs and symptoms develop, or if the current signs or symptoms continue to change or worsen for further workup, evaluation and treatment as clinically indicated and appropriate  The patient will follow up with their current PCP if and as advised. If the patient does not currently have a PCP we will assist them in obtaining one.   The patient may need specialty follow up if the symptoms continue, in spite of conservative treatment and management, for further workup, evaluation, consultation and treatment as clinically indicated and appropriate.  Patient/parent/caregiver verbalized understanding and agreement of plan as discussed.  All questions were addressed during visit.  Please see discharge instructions below for further details of plan.  This office note has been dictated using Teaching laboratory technician.  Unfortunately, this method of dictation can sometimes lead to typographical or grammatical errors.  I apologize for your inconvenience in advance if this occurs.  Please do not hesitate to reach out to me if clarification is needed.      Joesph Shaver Scales, PA-C 01/02/24 1525

## 2024-01-02 NOTE — Discharge Instructions (Addendum)
 Your rapid influenza and COVID-19 antigen test today was negative.  No further influenza testing is indicated.  Please consider retesting for COVID-19 in the next 2 to 3 days, particularly if you are not feeling any better.  You are welcome to return here to urgent care to repeat the test or you can take a home COVID-19 test.     If both your COVID-19 tests are negative, then you can safely assume that your illness is due to one of the many less serious illnesses circulating in our community right now.     Your strep test today is negative.  Streptococcal throat culture will be performed per our protocol.  The result of your throat culture will be posted to your MyChart once it is complete, this typically takes 3 to 5 days.  If your streptococcal throat culture is positive, you will be contacted by phone and antibiotics will prescribed for you.   Conservative care is recommended with rest, drinking plenty of clear fluids, eating only when hungry, taking supportive medications for your symptoms and avoiding being around other people.  Please remain at home until you are fever free for 24 hours without the use of antifever medications such as Tylenol  and ibuprofen .   Please read below to learn more about the medications, dosages and frequencies that I recommend to help alleviate your symptoms and to get you feeling better soon:   Advil , Motrin  (ibuprofen ): This is a good anti-inflammatory medication which addresses aches, pains and inflammation of the upper airways that causes sinus and nasal congestion as well as in the lower airways which makes your cough feel tight and sometimes burn.  I recommend that you take  400 mg every 6-8 hours as needed.  Please do not take more than 2400 mg of ibuprofen  in a 24-hour period and please do not take high doses of ibuprofen  for more than 3 days in a row as this can lead to stomach ulcers.  ProAir , Ventolin , Proventil  (albuterol ): This inhaled medication contains a  short acting beta agonist bronchodilator.  This medication works on the smooth muscle that opens and constricts of your airways by relaxing the muscle.  The result of relaxation of the smooth muscle is increased air movement and improved work of breathing.  This is a short acting medication that can be used every 4-6 hours as needed for increased work of breathing, shortness of breath, wheezing and excessive coughing.     Robitussin, Mucinex (guaifenesin): This is a daytime expectorant.  This single symptom reliever helps break up chest congestion and loosen up thick nasal drainage making phlegm and drainage easier to cough up and to blow out from your nose.  I recommend taking 400 mg in either liquid or tablet form three times daily as needed.  I do not recommend the 12-hour extended relief version or doses higher than 400 mg per each dose as these often make some patients feel jittery or jumpy and can interfere with sleep.  I also do not recommend that you purchase guaifenesin with the ingredient  DM which is dextromethorphan, a cough suppressant which I only recommend taking at bedtime.  Guaifenesin 400 mg is a safe dose for people who are being treated for high blood pressure.     Promethazine DM: Promethazine is both a nasal decongestant that dries up mucous membranes and an antinausea medication.  Promethazine often makes most patients feel fairly sleepy.  DM is dextromethorphan, a single symptom reliever which is a cough  suppressant found in many over-the-counter cough medications and combination cold preparations.  Please take 5 mL before bedtime to minimize your cough which will help you sleep better.  I have sent a prescription for this medication to your pharmacy because it cannot be purchased over-the-counter.   Zofran  (ondansetron ): This is a good antinausea medication that you can take every 8 hours as needed for symptoms of nausea and vomiting.  It is a dissolvable tablet that you do not  need to take with water , just put it under your tongue and let it melt away.  I have sent a prescription to your pharmacy.   If symptoms have not meaningfully improved in the next 3 to 5 days, please return for repeat evaluation or follow-up with your regular provider.  If symptoms have worsened in the next 3 to 5 days, please go to the emergency room for further evaluation.    Thank you for visiting urgent care today.  We appreciate the opportunity to participate in your care.

## 2024-01-02 NOTE — ED Triage Notes (Signed)
 Pt reports since yesterday had headache, sore throat, congestion, cough. Tried cough medication.

## 2024-01-04 LAB — CULTURE, GROUP A STREP (THRC)

## 2024-01-05 ENCOUNTER — Ambulatory Visit: Payer: Self-pay | Admitting: Emergency Medicine

## 2024-01-05 DIAGNOSIS — B9689 Other specified bacterial agents as the cause of diseases classified elsewhere: Secondary | ICD-10-CM

## 2024-01-05 MED ORDER — AMOXICILLIN 500 MG PO CAPS: 500.0000 mg | ORAL_CAPSULE | Freq: Two times a day (BID) | ORAL | 0 refills | Status: AC

## 2024-01-05 NOTE — Progress Notes (Signed)
 Strep culture was positive.  Rx for amox sent to [facility] on [address]

## 2024-02-17 ENCOUNTER — Ambulatory Visit (HOSPITAL_COMMUNITY)
Admission: EM | Admit: 2024-02-17 | Discharge: 2024-02-17 | Disposition: A | Discharge status: Home / Self Care | Attending: Family Medicine | Admitting: Family Medicine

## 2024-02-17 ENCOUNTER — Other Ambulatory Visit: Payer: Self-pay

## 2024-02-17 ENCOUNTER — Encounter (HOSPITAL_COMMUNITY): Payer: Self-pay | Admitting: Emergency Medicine

## 2024-02-17 DIAGNOSIS — J069 Acute upper respiratory infection, unspecified: Secondary | ICD-10-CM | POA: Diagnosis not present

## 2024-02-17 LAB — POCT INFLUENZA A/B
Influenza A, POC: NEGATIVE
Influenza B, POC: NEGATIVE

## 2024-02-17 LAB — POC SOFIA SARS ANTIGEN FIA: SARS Coronavirus 2 Ag: NEGATIVE

## 2024-02-17 MED ORDER — BENZONATATE 100 MG PO CAPS: 100.0000 mg | ORAL_CAPSULE | Freq: Three times a day (TID) | ORAL | 0 refills | Status: AC | PRN

## 2024-02-17 MED ORDER — ONDANSETRON 4 MG PO TBDP: 4.0000 mg | ORAL_TABLET | Freq: Three times a day (TID) | ORAL | 0 refills | Status: AC | PRN

## 2024-02-17 NOTE — ED Notes (Signed)
 Reviewed work note

## 2024-02-17 NOTE — ED Provider Notes (Signed)
 " [facility]    CSN: [id number] Arrival date & time: 02/17/24  1312      History   Chief Complaint Chief Complaint  Patient presents with   Cough    HPI Justin [name]. is a 34 y.o. male.    Cough  Here for cough and nasal congestion and some abdominal pain and vomiting.  He has had some central chest pain that bothers him when he takes a deep breath.  Uncertain fever.  No chills.  He does feel tired  He has had some posttussive emesis, but he has also vomited some apart from coughing.  NKDA  Denies taking other chronic medications.  Last eGFR was greater than 60  Past medical history significant for schizophrenia  Past Medical History:  Diagnosis Date   Abnormal behavior    Homelessness    Schizophrenia [facility])     Patient Active Problem List   Diagnosis Date Noted   Schizophrenia, paranoid type (HCC) 07/18/2015   Acute psychosis (HCC)    Mild cognitive disorder 02/01/2015   Hx of epistaxis 02/01/2015   Vitamin B12 deficiency 01/22/2015   TSH deficiency 01/22/2015   No significant past medical history     History reviewed. No pertinent surgical history.     Home Medications    Prior to Admission medications  Medication Sig Start Date End Date Taking? Authorizing Provider  benzonatate  (TESSALON ) 100 MG capsule Take 1 capsule (100 mg total) by mouth 3 (three) times daily as needed for cough. 02/17/24  Yes Vonna Sharlet POUR, MD  ondansetron  (ZOFRAN -ODT) 4 MG disintegrating tablet Take 1 tablet (4 mg total) by mouth every 8 (eight) hours as needed for nausea or vomiting. 02/17/24  Yes Vonna Sharlet POUR, MD  albuterol  (VENTOLIN  HFA) 108 (90 Base) MCG/ACT inhaler Inhale 2 puffs into the lungs every 6 (six) hours as needed for wheezing or shortness of breath (Cough). 01/02/24   Joesph Shaver Scales, PA-C  benztropine  (COGENTIN ) 1 MG tablet Take 1 tablet (1 mg total) by mouth 2 (two) times daily. 03/25/18   Malinda Rogue, MD  citalopram   (CELEXA ) 20 MG tablet Take 1 tablet (20 mg total) by mouth daily. 03/26/18   Malinda Rogue, MD  diphenoxylate -atropine  (LOMOTIL ) 2.5-0.025 MG tablet Take 1 tablet by mouth 4 (four) times daily as needed for diarrhea or loose stools. 12/12/23   Piontek, Rocky, MD  Divalproex Sodium (DEPAKOTE PO) Take by mouth.    [provider]  omeprazole  (PRILOSEC) 40 MG capsule Take 1 capsule (40 mg total) by mouth daily. 07/25/21   Tanda Bleacher, MD  Paliperidone  Palmitate (INVEGA  SUSTENNA IM) Inject 1 Dose into the muscle every 6 (six) weeks.    [provider]    Family History Family History  Problem Relation Age of Onset   Depression Father    Bipolar disorder Father    Alcohol abuse Father     Social History Social History[1]   Allergies   Bee venom and Other   Review of Systems Review of Systems  Respiratory:  Positive for cough.      Physical Exam Triage Vital Signs ED Triage Vitals  Encounter Vitals Group     BP 02/17/24 1614 (!) 126/90     Girls Systolic BP Percentile --      Girls Diastolic BP Percentile --      Boys Systolic BP Percentile --      Boys Diastolic BP Percentile --      Pulse Rate  02/17/24 1614 94     Resp 02/17/24 1614 18     Temp 02/17/24 1614 98.8 F (37.1 C)     Temp Source 02/17/24 1614 Oral     SpO2 02/17/24 1614 96 %     Weight --      Height --      Head Circumference --      Peak Flow --      Pain Score 02/17/24 1612 8     Pain Loc --      Pain Education --      Exclude from Growth Chart --    No data found.  Updated Vital Signs BP (!) 126/90 (BP Location: Right Arm) Comment (BP Location): large cuff  Pulse 94   Temp 98.8 F (37.1 C) (Oral)   Resp 18   SpO2 96%   Visual Acuity Right Eye Distance:   Left Eye Distance:   Bilateral Distance:    Right Eye Near:   Left Eye Near:    Bilateral Near:     Physical Exam Vitals reviewed.  Constitutional:      General: He is not in acute distress.    Appearance: He is  not ill-appearing, toxic-appearing or diaphoretic.  HENT:     Right Ear: Tympanic membrane and ear canal normal.     Left Ear: Tympanic membrane and ear canal normal.     Nose: Congestion present.     Mouth/Throat:     Mouth: Mucous membranes are moist.     Pharynx: No oropharyngeal exudate or posterior oropharyngeal erythema.  Eyes:     Extraocular Movements: Extraocular movements intact.     Conjunctiva/sclera: Conjunctivae normal.     Pupils: Pupils are equal, round, and reactive to light.  Cardiovascular:     Rate and Rhythm: Normal rate and regular rhythm.     Heart sounds: No murmur heard. Pulmonary:     Effort: Pulmonary effort is normal. No respiratory distress.     Breath sounds: Normal breath sounds. No stridor. No wheezing, rhonchi or rales.  Chest:     Chest wall: No tenderness.  Abdominal:     Palpations: Abdomen is soft.  Musculoskeletal:     Cervical back: Neck supple.  Lymphadenopathy:     Cervical: No cervical adenopathy.  Skin:    Capillary Refill: Capillary refill takes less than 2 seconds.     Coloration: Skin is not jaundiced or pale.  Neurological:     General: No focal deficit present.     Mental Status: He is alert and oriented to person, place, and time.  Psychiatric:        Behavior: Behavior normal.      UC Treatments / Results  Labs (all labs ordered are listed, but only abnormal results are displayed) Labs Reviewed  POC SOFIA SARS ANTIGEN FIA  POCT INFLUENZA A/B    EKG   Radiology No results found.  Procedures Procedures (including critical care time)  Medications Ordered in UC Medications - No data to display  Initial Impression / Assessment and Plan / UC Course  I have reviewed the triage vital signs and the nursing notes.  Pertinent labs & imaging results that were available during my care of the patient were reviewed by me and considered in my medical decision making (see chart for details).    {The patient has been seen  in Urgent Care in the last 3 years. :1 Testing for flu and COVID is negative  Tessalon  Perles are  sent in for cough and Zofran  is sent in for nausea. Final Clinical Impressions(s) / UC Diagnoses   Final diagnoses:  Viral URI     Discharge Instructions      Testing for flu and COVID is negative  Take benzonatate  100 mg, 1 tab every 8 hours as needed for cough.  Ondansetron  dissolved in the mouth every 8 hours as needed for nausea or vomiting. Clear liquids(water , gatorade/pedialyte, ginger ale/sprite, chicken broth/soup) and bland things(crackers/toast, rice, potato, bananas) to eat. Avoid acidic foods like lemon/lime/orange/tomato, and avoid greasy/spicy foods.       ED Prescriptions     Medication Sig Dispense Auth. Provider   benzonatate  (TESSALON ) 100 MG capsule Take 1 capsule (100 mg total) by mouth 3 (three) times daily as needed for cough. 21 capsule Vonna Sharlet POUR, MD   ondansetron  (ZOFRAN -ODT) 4 MG disintegrating tablet Take 1 tablet (4 mg total) by mouth every 8 (eight) hours as needed for nausea or vomiting. 10 tablet Vonna Daleon Willinger K, MD      PDMP not reviewed this encounter.     [1]  Social History Tobacco Use   Smoking status: Never   Smokeless tobacco: Never  Vaping Use   Vaping status: Never Used  Substance Use Topics   Alcohol use: No   Drug use: No     Vonna Sharlet POUR, MD 02/17/24 1724  "

## 2024-02-17 NOTE — ED Triage Notes (Signed)
 Reports symptoms started 2 days ago.  Complains of chest pain, cough, abdominal pain and vomiting.  Patient complains of headache.  Reports he coughs so hard he vomits.  Patient has taken tylenol .

## 2024-02-17 NOTE — Discharge Instructions (Signed)
 Testing for flu and COVID is negative  Take benzonatate  100 mg, 1 tab every 8 hours as needed for cough.  Ondansetron  dissolved in the mouth every 8 hours as needed for nausea or vomiting. Clear liquids(water , gatorade/pedialyte, ginger ale/sprite, chicken broth/soup) and bland things(crackers/toast, rice, potato, bananas) to eat. Avoid acidic foods like lemon/lime/orange/tomato, and avoid greasy/spicy foods.
